# Patient Record
Sex: Male | Born: 1955
Health system: Southern US, Community
[De-identification: ages and names within clinical notes are randomized; demographics above are authoritative.]

## PROBLEM LIST (undated history)

## (undated) DIAGNOSIS — M545 Low back pain, unspecified: Secondary | ICD-10-CM

## (undated) DIAGNOSIS — E785 Hyperlipidemia, unspecified: Secondary | ICD-10-CM

## (undated) DIAGNOSIS — IMO0002 Reserved for concepts with insufficient information to code with codable children: Secondary | ICD-10-CM

## (undated) DIAGNOSIS — I2510 Atherosclerotic heart disease of native coronary artery without angina pectoris: Secondary | ICD-10-CM

## (undated) DIAGNOSIS — I209 Angina pectoris, unspecified: Secondary | ICD-10-CM

## (undated) DIAGNOSIS — Z9289 Personal history of other medical treatment: Secondary | ICD-10-CM

## (undated) DIAGNOSIS — F329 Major depressive disorder, single episode, unspecified: Secondary | ICD-10-CM

## (undated) DIAGNOSIS — E039 Hypothyroidism, unspecified: Secondary | ICD-10-CM

## (undated) DIAGNOSIS — N200 Calculus of kidney: Secondary | ICD-10-CM

## (undated) DIAGNOSIS — R001 Bradycardia, unspecified: Secondary | ICD-10-CM

## (undated) DIAGNOSIS — J449 Chronic obstructive pulmonary disease, unspecified: Secondary | ICD-10-CM

## (undated) DIAGNOSIS — B1920 Unspecified viral hepatitis C without hepatic coma: Secondary | ICD-10-CM

## (undated) DIAGNOSIS — K219 Gastro-esophageal reflux disease without esophagitis: Secondary | ICD-10-CM

## (undated) DIAGNOSIS — M1990 Unspecified osteoarthritis, unspecified site: Secondary | ICD-10-CM

## (undated) DIAGNOSIS — H5440 Blindness, one eye, unspecified eye: Secondary | ICD-10-CM

## (undated) DIAGNOSIS — F32A Depression, unspecified: Secondary | ICD-10-CM

## (undated) DIAGNOSIS — M419 Scoliosis, unspecified: Secondary | ICD-10-CM

## (undated) DIAGNOSIS — G8929 Other chronic pain: Secondary | ICD-10-CM

## (undated) DIAGNOSIS — I219 Acute myocardial infarction, unspecified: Secondary | ICD-10-CM

## (undated) DIAGNOSIS — H3530 Unspecified macular degeneration: Secondary | ICD-10-CM

## (undated) DIAGNOSIS — R1011 Right upper quadrant pain: Secondary | ICD-10-CM

## (undated) DIAGNOSIS — I10 Essential (primary) hypertension: Secondary | ICD-10-CM

## (undated) DIAGNOSIS — Z87442 Personal history of urinary calculi: Secondary | ICD-10-CM

## (undated) HISTORY — DX: Chronic obstructive pulmonary disease, unspecified: J44.9

## (undated) HISTORY — DX: Depression, unspecified: F32.A

## (undated) HISTORY — PX: CORONARY ANGIOPLASTY WITH STENT PLACEMENT: SHX49

## (undated) HISTORY — PX: TOOTH EXTRACTION: SUR596

## (undated) HISTORY — DX: Reserved for concepts with insufficient information to code with codable children: IMO0002

## (undated) HISTORY — DX: Right upper quadrant pain: R10.11

## (undated) HISTORY — PX: BACK SURGERY: SHX140

## (undated) HISTORY — DX: Major depressive disorder, single episode, unspecified: F32.9

## (undated) HISTORY — PX: INGUINAL HERNIA REPAIR: SUR1180

## (undated) HISTORY — DX: Gastro-esophageal reflux disease without esophagitis: K21.9

## (undated) HISTORY — PX: CARDIAC CATHETERIZATION: SHX172

## (undated) HISTORY — PX: COLONOSCOPY: SHX174

## (undated) HISTORY — DX: Hyperlipidemia, unspecified: E78.5

## (undated) HISTORY — DX: Atherosclerotic heart disease of native coronary artery without angina pectoris: I25.10

---

## 2006-03-21 ENCOUNTER — Ambulatory Visit: Payer: Self-pay | Admitting: *Deleted

## 2006-03-21 ENCOUNTER — Inpatient Hospital Stay (HOSPITAL_COMMUNITY): Admission: EM | Admit: 2006-03-21 | Discharge: 2006-03-25 | Payer: Self-pay | Admitting: Cardiology

## 2006-03-29 ENCOUNTER — Ambulatory Visit: Payer: Self-pay | Admitting: Cardiology

## 2006-04-07 ENCOUNTER — Ambulatory Visit: Payer: Self-pay | Admitting: Cardiology

## 2006-05-26 ENCOUNTER — Ambulatory Visit: Payer: Self-pay | Admitting: Internal Medicine

## 2006-05-26 ENCOUNTER — Inpatient Hospital Stay (HOSPITAL_COMMUNITY): Admission: EM | Admit: 2006-05-26 | Discharge: 2006-05-27 | Payer: Self-pay | Admitting: Emergency Medicine

## 2006-06-09 ENCOUNTER — Ambulatory Visit: Payer: Self-pay | Admitting: Cardiology

## 2006-06-09 ENCOUNTER — Observation Stay (HOSPITAL_COMMUNITY): Admission: EM | Admit: 2006-06-09 | Discharge: 2006-06-13 | Payer: Self-pay | Admitting: Cardiology

## 2006-06-23 ENCOUNTER — Ambulatory Visit: Payer: Self-pay | Admitting: Cardiology

## 2006-09-07 ENCOUNTER — Inpatient Hospital Stay (HOSPITAL_COMMUNITY): Admission: EM | Admit: 2006-09-07 | Discharge: 2006-09-09 | Payer: Self-pay | Admitting: Emergency Medicine

## 2006-09-07 ENCOUNTER — Ambulatory Visit: Payer: Self-pay | Admitting: *Deleted

## 2007-02-07 ENCOUNTER — Ambulatory Visit: Payer: Self-pay | Admitting: Cardiology

## 2007-02-09 ENCOUNTER — Encounter: Payer: Self-pay | Admitting: Cardiology

## 2007-03-07 ENCOUNTER — Ambulatory Visit: Payer: Self-pay | Admitting: Cardiology

## 2007-05-29 ENCOUNTER — Ambulatory Visit: Payer: Self-pay | Admitting: Cardiology

## 2007-07-11 ENCOUNTER — Ambulatory Visit: Payer: Self-pay | Admitting: Cardiology

## 2007-07-25 ENCOUNTER — Ambulatory Visit: Payer: Self-pay | Admitting: Cardiology

## 2007-11-07 ENCOUNTER — Ambulatory Visit: Payer: Self-pay | Admitting: Cardiology

## 2007-12-13 ENCOUNTER — Encounter: Payer: Self-pay | Admitting: Physician Assistant

## 2007-12-13 ENCOUNTER — Ambulatory Visit: Payer: Self-pay | Admitting: Cardiology

## 2007-12-14 ENCOUNTER — Encounter: Payer: Self-pay | Admitting: Cardiology

## 2008-02-15 ENCOUNTER — Ambulatory Visit: Payer: Self-pay | Admitting: Cardiology

## 2008-03-19 ENCOUNTER — Ambulatory Visit: Payer: Self-pay | Admitting: Cardiology

## 2008-03-29 ENCOUNTER — Encounter: Payer: Self-pay | Admitting: Cardiology

## 2008-04-18 ENCOUNTER — Ambulatory Visit: Payer: Self-pay | Admitting: Cardiology

## 2008-06-17 ENCOUNTER — Ambulatory Visit: Payer: Self-pay | Admitting: Cardiology

## 2008-06-24 ENCOUNTER — Encounter: Payer: Self-pay | Admitting: Physician Assistant

## 2008-06-24 ENCOUNTER — Ambulatory Visit: Payer: Self-pay | Admitting: Cardiology

## 2008-07-09 ENCOUNTER — Ambulatory Visit: Payer: Self-pay | Admitting: Cardiology

## 2008-10-14 ENCOUNTER — Encounter: Payer: Self-pay | Admitting: Cardiology

## 2008-11-13 ENCOUNTER — Ambulatory Visit: Payer: Self-pay | Admitting: Cardiology

## 2009-01-21 ENCOUNTER — Encounter: Payer: Self-pay | Admitting: Cardiology

## 2009-03-23 ENCOUNTER — Encounter: Payer: Self-pay | Admitting: Cardiology

## 2009-03-24 ENCOUNTER — Encounter: Payer: Self-pay | Admitting: Cardiology

## 2009-03-26 ENCOUNTER — Encounter: Payer: Self-pay | Admitting: Cardiology

## 2009-03-26 DIAGNOSIS — I498 Other specified cardiac arrhythmias: Secondary | ICD-10-CM | POA: Insufficient documentation

## 2009-03-26 DIAGNOSIS — E782 Mixed hyperlipidemia: Secondary | ICD-10-CM

## 2009-03-26 DIAGNOSIS — I10 Essential (primary) hypertension: Secondary | ICD-10-CM | POA: Insufficient documentation

## 2009-03-26 DIAGNOSIS — I2510 Atherosclerotic heart disease of native coronary artery without angina pectoris: Secondary | ICD-10-CM

## 2009-04-01 ENCOUNTER — Ambulatory Visit: Payer: Self-pay | Admitting: Cardiology

## 2009-04-09 ENCOUNTER — Encounter: Payer: Self-pay | Admitting: Cardiology

## 2009-04-20 ENCOUNTER — Encounter: Payer: Self-pay | Admitting: Cardiology

## 2009-05-05 ENCOUNTER — Ambulatory Visit: Payer: Self-pay | Admitting: Cardiovascular Disease

## 2009-05-05 ENCOUNTER — Encounter: Payer: Self-pay | Admitting: Cardiovascular Disease

## 2009-05-06 ENCOUNTER — Ambulatory Visit: Payer: Self-pay | Admitting: Cardiovascular Disease

## 2009-05-06 ENCOUNTER — Inpatient Hospital Stay (HOSPITAL_BASED_OUTPATIENT_CLINIC_OR_DEPARTMENT_OTHER): Admission: RE | Admit: 2009-05-06 | Discharge: 2009-05-06 | Payer: Self-pay | Admitting: Cardiovascular Disease

## 2009-05-21 ENCOUNTER — Ambulatory Visit: Payer: Self-pay | Admitting: Cardiology

## 2009-05-21 DIAGNOSIS — B182 Chronic viral hepatitis C: Secondary | ICD-10-CM | POA: Insufficient documentation

## 2009-06-02 ENCOUNTER — Telehealth (INDEPENDENT_AMBULATORY_CARE_PROVIDER_SITE_OTHER): Payer: Self-pay | Admitting: *Deleted

## 2009-06-05 ENCOUNTER — Encounter (INDEPENDENT_AMBULATORY_CARE_PROVIDER_SITE_OTHER): Payer: Self-pay | Admitting: *Deleted

## 2009-06-08 ENCOUNTER — Encounter: Payer: Self-pay | Admitting: Cardiovascular Disease

## 2009-06-16 ENCOUNTER — Encounter: Payer: Self-pay | Admitting: Cardiovascular Disease

## 2009-12-07 ENCOUNTER — Encounter: Payer: Self-pay | Admitting: Cardiology

## 2009-12-10 ENCOUNTER — Ambulatory Visit: Payer: Self-pay | Admitting: Cardiology

## 2009-12-11 ENCOUNTER — Encounter: Payer: Self-pay | Admitting: Cardiology

## 2009-12-29 ENCOUNTER — Ambulatory Visit: Payer: Self-pay | Admitting: Cardiology

## 2009-12-29 DIAGNOSIS — F341 Dysthymic disorder: Secondary | ICD-10-CM | POA: Insufficient documentation

## 2010-01-29 ENCOUNTER — Encounter: Payer: Self-pay | Admitting: Cardiology

## 2010-02-01 ENCOUNTER — Encounter: Payer: Self-pay | Admitting: Cardiology

## 2010-02-05 ENCOUNTER — Ambulatory Visit: Payer: Self-pay | Admitting: Cardiology

## 2010-02-23 ENCOUNTER — Telehealth (INDEPENDENT_AMBULATORY_CARE_PROVIDER_SITE_OTHER): Payer: Self-pay | Admitting: *Deleted

## 2010-03-24 ENCOUNTER — Encounter: Payer: Self-pay | Admitting: Cardiology

## 2010-03-24 ENCOUNTER — Ambulatory Visit (HOSPITAL_COMMUNITY): Admission: RE | Admit: 2010-03-24 | Discharge: 2010-03-24 | Payer: Self-pay | Admitting: Internal Medicine

## 2010-03-24 HISTORY — PX: LIVER BIOPSY: SHX301

## 2010-05-11 ENCOUNTER — Ambulatory Visit: Payer: Self-pay | Admitting: Internal Medicine

## 2010-05-11 ENCOUNTER — Encounter: Payer: Self-pay | Admitting: Cardiology

## 2010-05-14 ENCOUNTER — Ambulatory Visit: Payer: Self-pay | Admitting: Cardiology

## 2010-07-16 ENCOUNTER — Telehealth (INDEPENDENT_AMBULATORY_CARE_PROVIDER_SITE_OTHER): Payer: Self-pay | Admitting: *Deleted

## 2010-08-01 LAB — CONVERTED CEMR LAB
Eosinophils Absolute: 0.2 10*3/uL (ref 0.0–0.7)
INR: 0.9 (ref 0.8–1.0)
MCHC: 35.4 g/dL (ref 30.0–36.0)
MCV: 99.2 fL (ref 78.0–100.0)
Monocytes Absolute: 1 10*3/uL (ref 0.1–1.0)
Neutrophils Relative %: 45.4 % (ref 43.0–77.0)
Platelets: 201 10*3/uL (ref 150.0–400.0)
Prothrombin Time: 9.8 s (ref 9.1–11.7)
RDW: 12.1 % (ref 11.5–14.6)

## 2010-08-03 NOTE — Assessment & Plan Note (Signed)
Summary: 6 MO FU PER MAY REMINDER-SRS   Visit Type:  Follow-up Primary Provider:  Sherryll Burger  CC:  recent hopitalized for chest pain.  History of Present Illness: the patient is a 55 year old male with history of coronary disease, status post multiple prior percutaneous coronary interventions. His last catheterization was in November 2010, yielding moderate diffuse CAD with patent stents in the LAD, RCA and obtuse marginal branch arteries. Left ventricular function was normal.  The patient over the last several years has required multiple admissions for substernal chest pain. He was also recently admitted weeks ago with atypical chest pain and ruled out for myocardial infarction. The patient has significant anxiety and depression and has a very difficult social situation. He had foster child that got murdered. His stepdaughter who is also expecting a baby has been in jail. The patient is unable to sleep and typically sleeps 3-4 hours a night. In the last 3 months or so he has developed panic attacks and episodes of palpitations and inability to read. Patient has ruminating thoughts. He works on Engineer, manufacturing systems and trucks and reports checking behavior: He often goes back and checks if bolds are secured. He has been started by his primary care physician on citalopram 4 weeks ago and told alprazolam in the p.m.  He still reports atypical chest pain but no symptoms consistent with angina. He has no orthopnea PND or palpitations.   Preventive Screening-Counseling & Management  Alcohol-Tobacco     Smoking Status: quit     Year Quit: 2007  Current Medications (verified): 1)  Amlodipine Besylate 5 Mg Tabs (Amlodipine Besylate) .... Take 1 Tablet By Mouth Once A Day 2)  Carvedilol 6.25 Mg Tabs (Carvedilol) .... Take 1 Tablet By Mouth Two Times A Day 3)  Lisinopril 10 Mg Tabs (Lisinopril) .... Take 1 Tablet By Mouth Once A Day 4)  Plavix 75 Mg Tabs (Clopidogrel Bisulfate) .... Take 1 Tablet By Mouth Once A  Day 5)  Aspirin 81 Mg Tbec (Aspirin) .... Take One Tablet By Mouth Daily 6)  Nitrostat 0.4 Mg Subl (Nitroglycerin) .[name].. 1 Tablet Under Tongue At Onset of Chest Pain; You May Repeat Every 5 Minutes For Up To 3 Doses. 7)  Clonazepam 0.5 Mg Tbdp (Clonazepam) .... Take 0.25mg  (1/2 Tab) Two Times A Day 8)  Citalopram Hydrobromide 20 Mg Tabs (Citalopram Hydrobromide) .... Take 1 Tablet By Mouth Once A Day 9)  Zegerid 40-1100 Mg Caps (Omeprazole-Sodium Bicarbonate) .... Take 1 Tablet By Mouth Once A Day 10)  Trazodone Hcl 50 Mg Tabs (Trazodone Hcl) .... Take 1 Tab By Mouth At Bedtime  Allergies (verified): 1)  ! Codeine 2)  ! Penicillin 3)  ! * Peaches  Comments:  Nurse/Medical Assistant: The patient's medication bottles and allergies were reviewed with the patient and were updated in the Medication and Allergy Lists.  Past History:  Past Medical History: Last updated: 05/05/2009 BRADYCARDIA (ICD-427.89) HYPERTENSION, UNSPECIFIED (ICD-401.9) HYPERLIPIDEMIA-MIXED (ICD-272.4) CAD, NATIVE VESSEL (ICD-414.01) status post percutaneous intervention 2007 and 2008, multivessel drug-eluting stents. COPD  Family History: Last updated: 03/26/2009 Family History of Cancer:  Family History of Diabetes:  Family History of Hypertension:   Social History: Last updated: 03/26/2009 Full Time Married  Tobacco Use - Former.  Alcohol Use - no Regular Exercise - no Drug Use - no  Risk Factors: Smoking Status: quit (12/29/2009)   Review of Systems       The patient complains of chest pain, shortness of breath, dizziness, depression, and anxiety.  The patient  denies fatigue, malaise, fever, weight gain/loss, vision loss, decreased hearing, hoarseness, palpitations, prolonged cough, wheezing, sleep apnea, abdominal pain, blood in stool, nausea, vomiting, diarrhea, heartburn, incontinence, blood in urine, muscle weakness, joint pain, leg swelling, rash, skin lesions, headache, fainting, enlarged  lymph nodes, easy bruising or bleeding, and environmental allergies.    Vital Signs:  Patient profile:   55 year old male Height:      69 inches Weight:      181 pounds O2 Sat:      99 % Pulse rate:   54 / minute BP sitting:   129 / 86  (left arm) Cuff size:   regular  Vitals Entered By: Carlye Grippe (December 29, 2009 9:51 AM) CC: recent hopitalized for chest pain   Physical Exam  Additional Exam:  General: Well-developed, well-nourished in no distress head: Normocephalic and atraumatic eyes PERRLA/EOMI intact, conjunctiva and lids normal nose: No deformity or lesions mouth normal dentition, normal posterior pharynx neck: Supple, no JVD.  No masses, thyromegaly or abnormal cervical nodes lungs: Normal breath sounds bilaterally without wheezing.  Normal percussion heart: regular rate and rhythm with normal S1 and S2, no S3 or S4.  PMI is normal.  No pathological murmurs abdomen: Normal bowel sounds, abdomen is soft and nontender without masses, organomegaly or hernias noted.  No hepatosplenomegaly musculoskeletal: Back normal, normal gait muscle strength and tone normal pulsus: Pulse is normal in all 4 extremities Extremities: No peripheral pitting edema neurologic: Alert and oriented x 3 skin: Intact without lesions or rashes cervical nodes: No significant adenopathy psychologic: Normal affect    EKG  Procedure date:  12/29/2009  Findings:      sinus bradycardia with sinus arrhythmia. Otherwise normal EKG  Impression & Recommendations:  Problem # 1:  CAD, NATIVE VESSEL (ICD-414.01) patient was recently admitted and ruled out for myocardial infarction. His chest pain is atypical and he does not need of further ischemia workup at the present time. His updated medication list for this problem includes:    Amlodipine Besylate 5 Mg Tabs (Amlodipine besylate) .[name]... Take 1 tablet by mouth once a day    Carvedilol 6.25 Mg Tabs (Carvedilol) .[name]... Take 1 tablet by mouth two  times a day    Lisinopril 10 Mg Tabs (Lisinopril) .[name]... Take 1 tablet by mouth once a day    Plavix 75 Mg Tabs (Clopidogrel bisulfate) .[name]... Take 1 tablet by mouth once a day    Aspirin 81 Mg Tbec (Aspirin) .[name]... Take one tablet by mouth daily    Nitrostat 0.4 Mg Subl (Nitroglycerin) .[name]... 1 tablet under tongue at onset of chest pain; you may repeat every 5 minutes for up to 3 doses.  Orders: EKG w/ Interpretation (93000)  Problem # 2:  HYPERTENSION, UNSPECIFIED (ICD-401.9) blood pressure is well controlled and I made no changes in his medical therapy. His updated medication list for this problem includes:    Amlodipine Besylate 5 Mg Tabs (Amlodipine besylate) .[name]... Take 1 tablet by mouth once a day    Carvedilol 6.25 Mg Tabs (Carvedilol) .[name]... Take 1 tablet by mouth two times a day    Lisinopril 10 Mg Tabs (Lisinopril) .[name]... Take 1 tablet by mouth once a day    Aspirin 81 Mg Tbec (Aspirin) .[name]... Take one tablet by mouth daily  Problem # 3:  ANXIETY DEPRESSION (ICD-300.4) I optimize the patient's antidepressant regimen. He also has significant anxiety and I started him on clonazepam 0.25 mg p.o. b.i.d. I also gave him specific  hypnotic antidepressant properties in the form of trazodone 50 mg p.o. q.h.s. He will need to continue citalopram as prescribed by his primary care physician. Clonazepam may need to be increased to 0.5 milligrams p.o. b.i.d.I told him that he still can take occasional Xanax and he has a panic attack. The patient may need to be considered also for behavioral therapy  Patient Instructions: 1)  Stop Xanax at night 2)  Clonazepam 0.25mg  two times a day  3)  Trazodone 50mg  at bedtime  4)  Follow up in  1 month Prescriptions: TRAZODONE HCL 50 MG TABS (TRAZODONE HCL) Take 1 tab by mouth at bedtime  #30 x 1   Entered by:   Hoover Brunette, LPN   Authorized by:   Lewayne Bunting, MD, [facility]   Signed by:   Hoover Brunette, LPN on 16/04/9603   Method used:   Electronically to        [facility]* (retail)       [address]       [place], [place]  [address]       Ph: [phone]       Fax: [phone]   RxID:   [id number]   Handout requested. CLONAZEPAM 0.5 MG TBDP (CLONAZEPAM) take 0.25mg  (1/2 tab) two times a day  #30 x 2   Entered by:   Hoover Brunette, LPN   Authorized by:   Lewayne Bunting, MD, [facility]   Signed by:   Hoover Brunette, LPN on 04/30/2535   Method used:   Print then Give to Patient   RxID:   [id number]   Handout requested.  I have personnaly reviewed all medications changes and approved new prescriptions and refills. Lewayne Bunting, MD, [facility]  December 29, 2009 11:00 AM

## 2010-08-03 NOTE — Assessment & Plan Note (Signed)
Summary: 1 MO FU   Visit Type:  Follow-up Primary Provider:  Sherryll Burger   History of Present Illness: the patient is a 55 year old male with a history of coronary artery disease, former tobacco use as well as anxiety and depression. The patient has symptoms of recurrent atypical chest pain during stressful events. The patient has been placed on antianxiety and antidepressant therapy which has caused some improvement in his symptoms. During hot weather however he gets short of breath and has occasional exertional angina. Typically resting resolves his symptoms. The patient rarely however takes nitroglycerin. Overall he has somewhat improved.  Preventive Screening-Counseling & Management  Alcohol-Tobacco     Smoking Status: quit     Year Quit: 2007  Current Medications (verified): 1)  Amlodipine Besylate 5 Mg Tabs (Amlodipine Besylate) .... Take 1 Tablet By Mouth Once A Day 2)  Carvedilol 6.25 Mg Tabs (Carvedilol) .... Take 1 Tablet By Mouth Two Times A Day 3)  Lisinopril 10 Mg Tabs (Lisinopril) .... Take 1 Tablet By Mouth Once A Day 4)  Plavix 75 Mg Tabs (Clopidogrel Bisulfate) .... Take 1 Tablet By Mouth Once A Day 5)  Aspirin 81 Mg Tbec (Aspirin) .... Take One Tablet By Mouth Daily 6)  Nitrostat 0.4 Mg Subl (Nitroglycerin) .[name].. 1 Tablet Under Tongue At Onset of Chest Pain; You May Repeat Every 5 Minutes For Up To 3 Doses. 7)  Clonazepam 0.5 Mg Tbdp (Clonazepam) .... Take 0.25mg  (1/2 Tab) Two Times A Day 8)  Citalopram Hydrobromide 20 Mg Tabs (Citalopram Hydrobromide) .... Take 1 Tablet By Mouth Once A Day 9)  Zegerid 40-1100 Mg Caps (Omeprazole-Sodium Bicarbonate) .... Take 1 Tablet By Mouth Once A Day 10)  Trazodone Hcl 50 Mg Tabs (Trazodone Hcl) .... Take 1 Tab By Mouth At Bedtime 11)  Imdur 30 Mg Xr24h-Tab (Isosorbide Mononitrate) .... Take 1 Tablet By Mouth Once A Day  Allergies: 1)  ! Codeine 2)  ! Penicillin 3)  ! * Peaches  Comments:  Nurse/Medical Assistant: The patient's  medications were reviewed with the patient and were updated in the Medication List. Pt brought medication bottles to office visit.  Cyril Loosen, RN, BSN (February 05, 2010 1:17 PM)  Past History:  Past Medical History: Last updated: 05/05/2009 BRADYCARDIA (ICD-427.89) HYPERTENSION, UNSPECIFIED (ICD-401.9) HYPERLIPIDEMIA-MIXED (ICD-272.4) CAD, NATIVE VESSEL (ICD-414.01) status post percutaneous intervention 2007 and 2008, multivessel drug-eluting stents. COPD  Family History: Last updated: 03/26/2009 Family History of Cancer:  Family History of Diabetes:  Family History of Hypertension:   Social History: Last updated: 03/26/2009 Full Time Married  Tobacco Use - Former.  Alcohol Use - no Regular Exercise - no Drug Use - no  Risk Factors: Exercise: no (03/26/2009)  Risk Factors: Smoking Status: quit (02/05/2010)  Review of Systems       The patient complains of fatigue, chest pain, shortness of breath, prolonged cough, and anxiety.  The patient denies malaise, fever, weight gain/loss, vision loss, decreased hearing, hoarseness, palpitations, wheezing, sleep apnea, coughing up blood, abdominal pain, blood in stool, nausea, vomiting, diarrhea, heartburn, incontinence, blood in urine, muscle weakness, joint pain, leg swelling, rash, skin lesions, headache, fainting, dizziness, depression, enlarged lymph nodes, easy bruising or bleeding, and environmental allergies.    Vital Signs:  Patient profile:   55 year old male Height:      69 inches Weight:      178.25 pounds BMI:     26.42 Pulse rate:   54 / minute BP sitting:   146 / 86  (  left arm) Cuff size:   regular  Vitals Entered By: Cyril Loosen, RN, BSN (February 05, 2010 1:14 PM)  Nutrition Counseling: Patient's BMI is greater than 25 and therefore counseled on weight management options. Comments Follow up office visit   Physical Exam  Additional Exam:  General: Well-developed, well-nourished in no distress head:  Normocephalic and atraumatic eyes PERRLA/EOMI intact, conjunctiva and lids normal nose: No deformity or lesions mouth normal dentition, normal posterior pharynx neck: Supple, no JVD.  No masses, thyromegaly or abnormal cervical nodes lungs: Normal breath sounds bilaterally without wheezing.  Normal percussion heart: regular rate and rhythm with normal S1 and S2, no S3 or S4.  PMI is normal.  No pathological murmurs abdomen: Normal bowel sounds, abdomen is soft and nontender without masses, organomegaly or hernias noted.  No hepatosplenomegaly musculoskeletal: Back normal, normal gait muscle strength and tone normal pulsus: Pulse is normal in all 4 extremities Extremities: No peripheral pitting edema neurologic: Alert and oriented x 3 skin: Intact without lesions or rashes cervical nodes: No significant adenopathy psychologic: Normal affect    Impression & Recommendations:  Problem # 1:  ANXIETY DEPRESSION (ICD-300.4) improvement in symptoms. Patient is continuing current therapy  Problem # 2:  CAD, NATIVE VESSEL (ICD-414.01) the patient has atypical chest pain but also symptoms of exertional angina. I've given him a prescription of isosorbide mononitrate 30 mg a day. I also asked him to use nitroglycerin as needed His updated medication list for this problem includes:    Amlodipine Besylate 5 Mg Tabs (Amlodipine besylate) .[name]... Take 1 tablet by mouth once a day    Carvedilol 6.25 Mg Tabs (Carvedilol) .[name]... Take 1 tablet by mouth two times a day    Lisinopril 10 Mg Tabs (Lisinopril) .[name]... Take 1 tablet by mouth once a day    Plavix 75 Mg Tabs (Clopidogrel bisulfate) .[name]... Take 1 tablet by mouth once a day    Aspirin 81 Mg Tbec (Aspirin) .[name]... Take one tablet by mouth daily    Nitrostat 0.4 Mg Subl (Nitroglycerin) .[name]... 1 tablet under tongue at onset of chest pain; you may repeat every 5 minutes for up to 3 doses.    Imdur 30 Mg Xr24h-tab (Isosorbide mononitrate) .[name]... Take 1 tablet by  mouth once a day  Problem # 3:  HYPERTENSION, UNSPECIFIED (ICD-401.9) blood pressures controlled. His updated medication list for this problem includes:    Amlodipine Besylate 5 Mg Tabs (Amlodipine besylate) .[name]... Take 1 tablet by mouth once a day    Carvedilol 6.25 Mg Tabs (Carvedilol) .[name]... Take 1 tablet by mouth two times a day    Lisinopril 10 Mg Tabs (Lisinopril) .[name]... Take 1 tablet by mouth once a day    Aspirin 81 Mg Tbec (Aspirin) .[name]... Take one tablet by mouth daily  Patient Instructions: 1)  Imdur 30mg  daily 2)  Follow up in  3 months  Prescriptions: PLAVIX 75 MG TABS (CLOPIDOGREL BISULFATE) Take 1 tablet by mouth once a day  #30 x 6   Entered by:   Hoover Brunette, LPN   Authorized by:   Lewayne Bunting, MD, [facility]   Signed by:   Hoover Brunette, LPN on 32/44/0102   Method used:   Electronically to        [facility]* (retail)       [address]       [place], [place]  [address]       Ph: [phone]  Fax: [phone]   RxID:   [id number] LISINOPRIL 10 MG TABS (LISINOPRIL) Take 1 tablet by mouth once a day  #30 x 6   Entered by:   Hoover Brunette, LPN   Authorized by:   Lewayne Bunting, MD, [facility]   Signed by:   Hoover Brunette, LPN on 93/23/5573   Method used:   Electronically to        [facility]* (retail)       [address]       [place], [place]  [address]       Ph: [phone]       Fax: [phone]   RxID:   [id number] IMDUR 30 MG XR24H-TAB (ISOSORBIDE MONONITRATE) Take 1 tablet by mouth once a day  #30 x 6   Entered by:   Hoover Brunette, LPN   Authorized by:   Lewayne Bunting, MD, [facility]   Signed by:   Hoover Brunette, LPN on 03/50/0938   Method used:   Electronically to        [facility]* (retail)       [address]       [place], [place]  [address]       Ph: [phone]       Fax: [phone]   RxID:   [id number]   Handout requested.

## 2010-08-03 NOTE — Assessment & Plan Note (Signed)
Summary: 44mo ful fholt   Visit Type:  Follow-up Primary Provider:  Dr. Mervyn Skeeters. Shah/Dr. Rehman   History of Present Illness: patient had a recent liver biopsy. Indication hepatitis C.the patient is scheduled to start on several retroviral therapies as well as interferon.he needs to have adjustments made in his medical regimen. He's here todaydiscuss these medication changes.  the patient is a 55 year old male with a history of coronary artery disease, former tobacco use as well as anxiety and depression. The patient has symptoms of recurrent atypical chest pain during stressful events. The patient has been placed on antianxiety and antidepressant therapy which has caused some improvement in his symptoms. During hot weather however he gets short of breath and has occasional exertional angina. Typically resting resolves his symptoms. The patient rarely however takes nitroglycerin.   the patient had a cardiac catheterization in November 2010 exactly year ago. He had moderate diffuse coronary artery disease. He has normal LV function.  Preventive Screening-Counseling & Management  Alcohol-Tobacco     Smoking Status: quit  Comments: quit smoking about 2-3 yrs ago when had MI, smoked for about 20-30 yrs.  Current Medications (verified): 1)  Amlodipine Besylate 5 Mg Tabs (Amlodipine Besylate) .... Take 1 Tablet By Mouth Once A Day 2)  Carvedilol 6.25 Mg Tabs (Carvedilol) .... Take 1 Tablet By Mouth Two Times A Day 3)  Lisinopril 10 Mg Tabs (Lisinopril) .... Take 1 Tablet By Mouth Once A Day 4)  Plavix 75 Mg Tabs (Clopidogrel Bisulfate) .... Take 1 Tablet By Mouth Once A Day 5)  Aspirin 81 Mg Tbec (Aspirin) .... Take One Tablet By Mouth Daily 6)  Nitrostat 0.4 Mg Subl (Nitroglycerin) .[name].. 1 Tablet Under Tongue At Onset of Chest Pain; You May Repeat Every 5 Minutes For Up To 3 Doses. 7)  Clonazepam 0.5 Mg Tbdp (Clonazepam) .... Take 0.25mg  (1/2 Tab) Two Times A Day 8)  Citalopram Hydrobromide 20 Mg  Tabs (Citalopram Hydrobromide) .... Take 1 Tablet By Mouth Once A Day 9)  Zegerid 40-1100 Mg Caps (Omeprazole-Sodium Bicarbonate) .... Take 1 Tablet By Mouth Once A Day 10)  Trazodone Hcl 50 Mg Tabs (Trazodone Hcl) .... Take 1 Tab By Mouth At Bedtime 11)  Hydrocodone-Acetaminophen 5-500 Mg Tabs (Hydrocodone-Acetaminophen) .... Take 1 Tablet By Mouth Two Times A Day As Needed Pain  Allergies: 1)  ! Codeine 2)  ! Penicillin 3)  ! * Peaches  Comments:  Nurse/Medical Assistant: The patient's medications were reviewed with the patient and were updated in the Medication List. Pt brought medication bottles to office visit.  Cyril Loosen, RN, BSN (May 14, 2010 2:49 PM)  Past History:  Past Medical History: Last updated: 05/05/2009 BRADYCARDIA (ICD-427.89) HYPERTENSION, UNSPECIFIED (ICD-401.9) HYPERLIPIDEMIA-MIXED (ICD-272.4) CAD, NATIVE VESSEL (ICD-414.01) status post percutaneous intervention 2007 and 2008, multivessel drug-eluting stents. COPD  Family History: Last updated: 03/26/2009 Family History of Cancer:  Family History of Diabetes:  Family History of Hypertension:   Social History: Last updated: 03/26/2009 Full Time Married  Tobacco Use - Former.  Alcohol Use - no Regular Exercise - no Drug Use - no  Risk Factors: Smoking Status: quit (05/14/2010)  Review of Systems       The patient complains of fatigue, prolonged cough, and anxiety.  The patient denies malaise, fever, weight gain/loss, vision loss, decreased hearing, hoarseness, chest pain, palpitations, shortness of breath, wheezing, sleep apnea, coughing up blood, abdominal pain, blood in stool, nausea, vomiting, diarrhea, heartburn, incontinence, blood in urine, muscle weakness, joint pain, leg swelling, rash,  skin lesions, headache, fainting, dizziness, depression, enlarged lymph nodes, easy bruising or bleeding, and environmental allergies.    Vital Signs:  Patient profile:   55 year old male Height:       69 inches Weight:      170.50 pounds BMI:     25.27 Pulse rate:   51 / minute BP sitting:   147 / 87  (left arm) Cuff size:   regular  Vitals Entered By: Cyril Loosen, RN, BSN (May 14, 2010 2:42 PM)  Nutrition Counseling: Patient's BMI is greater than 25 and therefore counseled on weight management options. Comments Follow up office visit. Pt is gettin ready to start treatments for Hepatitis with Dr. Karilyn Cota. Per pt, Dr. Karilyn Cota told pt be on 1/2 his current dosage of Amlodipine, Trazadone, Xanax, and Clonazepam b/c per the pt it will be like being on the full dose. Pt will be taking Incivek 375mg , Riavirin 200mg , Pegasys kit.   Physical Exam  Additional Exam:  General: Well-developed, well-nourished in no distress head: Normocephalic and atraumatic eyes PERRLA/EOMI intact, conjunctiva and lids normal nose: No deformity or lesions mouthpoor dentition, normal posterior pharynx neck: Supple, no JVD.  No masses, thyromegaly or abnormal cervical nodes lungs: Normal breath sounds bilaterally without wheezing.  Normal percussion heart: regular rate and rhythm with normal S1 and S2, no S3 or S4.  PMI is normal.  No pathological murmurs abdomen: Normal bowel sounds, abdomen is soft and nontender without masses, organomegaly or hernias noted.  No hepatosplenomegaly musculoskeletal: Back normal, normal gait muscle strength and tone normal pulsus: Pulse is normal in all 4 extremities Extremities: No peripheral pitting edema neurologic: Alert and oriented x 3 skin: Intact without lesions or rashes cervical nodes: No significant adenopathy psychologic: Normal affect    Impression & Recommendations:  Problem # 1:  HEPATITIS C CARRIER (ICD-V02.62) the patient will restart on several retroviral medications as well as interferon. When he starts his therapy he will have to discontinue trazodone as well as amlodipine. Clonazepam can be continued on a lower dose. I will prescribe Ambien 10  mg p.o. q.h.s. for sleep at that time. In the interim he can continue his medications until he is ready to start his retroviral and interferon therapy regimen  Problem # 2:  CAD, NATIVE VESSEL (ICD-414.01) no recurrent chest pain. Hemodynamically stable. Continue current medical therapy. The following medications were removed from the medication list:    Imdur 30 Mg Xr24h-tab (Isosorbide mononitrate) .[name]... Take 1 tablet by mouth once a day His updated medication list for this problem includes:    Amlodipine Besylate 5 Mg Tabs (Amlodipine besylate) .[name]... Take 1 tablet by mouth once a day    Carvedilol 6.25 Mg Tabs (Carvedilol) .[name]... Take 1 tablet by mouth two times a day    Lisinopril 10 Mg Tabs (Lisinopril) .[name]... Take 1 tablet by mouth once a day    Plavix 75 Mg Tabs (Clopidogrel bisulfate) .[name]... Take 1 tablet by mouth once a day    Aspirin 81 Mg Tbec (Aspirin) .[name]... Take one tablet by mouth daily    Nitrostat 0.4 Mg Subl (Nitroglycerin) .[name]... 1 tablet under tongue at onset of chest pain; you may repeat every 5 minutes for up to 3 doses.  Problem # 3:  HYPERTENSION, UNSPECIFIED (ICD-401.9) blood pressure is stable. I do think safely to stop amlodipine when he starts with his hepatitis C treatment. We will just have to follow his blood pressure. His updated medication list for this problem includes:  Amlodipine Besylate 5 Mg Tabs (Amlodipine besylate) .[name]... Take 1 tablet by mouth once a day    Carvedilol 6.25 Mg Tabs (Carvedilol) .[name]... Take 1 tablet by mouth two times a day    Lisinopril 10 Mg Tabs (Lisinopril) .[name]... Take 1 tablet by mouth once a day    Aspirin 81 Mg Tbec (Aspirin) .[name]... Take one tablet by mouth daily  Patient Instructions: 1)  Your physician recommends that you continue on your current medications as directed. Please refer to the Current Medication list given to you today. 2)  Follow up in  6 months 3)  ================================================= 4)   ================================================= 5)  Stop Trazodone 6)  Ambien 10mg  at bedtime 7)  Clonazepam 0.25mg  two times a day  8)  Stop Amlodipine 9)  All changes above will begine after starting treatment.

## 2010-08-03 NOTE — Letter (Signed)
Summary: [facility] - Operative Report  [facility] - Operative Report   Imported By: Marylou Mccoy 07/22/2009 17:52:12  _____________________________________________________________________  External Attachment:    Type:   Image     Comment:   External Document

## 2010-08-03 NOTE — Consult Note (Signed)
Summary: Consultation Report  Consultation Report   Imported By: Dorise Hiss 05/14/2010 10:41:40  _____________________________________________________________________  External Attachment:    Type:   Image     Comment:   External Document

## 2010-08-03 NOTE — Letter (Signed)
Summary: Discharge Summary/ REPORT  Discharge Summary/ REPORT   Imported By: Dorise Hiss 01/05/2010 12:18:05  _____________________________________________________________________  External Attachment:    Type:   Image     Comment:   External Document

## 2010-08-03 NOTE — Progress Notes (Signed)
Summary: colonoscopy  Phone Note Other Incoming Call back at ext.  [phone]   Caller: Ann with Dr. Karilyn Cota  Summary of Call: Having colonoscopy (lg polyp) on 9/7.  Needs to know if okay to hold Plavix 5-7 days prior to this  [facility], LPN  February 23, 2010 2:35 PM   Follow-up for Phone Call        Continue ASA if possible. Increased risk with stopping Plavix but if colonoscopy is essential, then OK to stop Plavix 5 days before and resume ASAP.  Follow-up by: Lewayne Bunting, MD, [facility],  February 25, 2010 12:55 PM  Additional Follow-up for Phone Call Additional follow up Details #1::        Ann with Dr. Karilyn Cota notified.  Will fax note.    Additional Follow-up by: Hoover Brunette, LPN,  February 26, 2010 4:14 PM

## 2010-08-05 NOTE — Progress Notes (Signed)
Summary: med refill   Phone Note Other Incoming   Summary of Call: [facility] sent refill request on Clonazepam 0.5mg  - 1/2 tab two times a day & Trazodone 50mg  at bedtime.  Per dictation in November, patient was to call us when he went on new meds (antiviral) for Hepatitis C.  Above meds were to be adjusted prior to that.  Called patient - has not been able to start these meds due to insurance approval.  Following up with Dr. Karilyn Cota on this.  Please advise on refills above.   Also, now has new problem with eyes.  Seeing Dr. Lucie Leather at [facility]. Had some type of dye study on eyes.  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  Going to [place] next week to have MRI, concerned about blockage. Initial call taken by: Hoover Brunette, LPN,  July 16, 2010 4:12 PM  Follow-up for Phone Call        okay to refill clonazepam and trazodone.30 days each with one refill Follow-up by: Lewayne Bunting, MD, [facility],  July 16, 2010 4:25 PM  Additional Follow-up for Phone Call Additional follow up Details #1::        Patient notified.    Hoover Brunette, LPN  July 19, 2010 10:15 AM

## 2010-09-16 LAB — CBC
HCT: 40.1 % (ref 39.0–52.0)
Hemoglobin: 14.2 g/dL (ref 13.0–17.0)
MCH: 33.3 pg (ref 26.0–34.0)
MCHC: 35.3 g/dL (ref 30.0–36.0)
MCV: 94.1 fL (ref 78.0–100.0)
Platelets: 147 10*3/uL — ABNORMAL LOW (ref 150–400)
RBC: 4.26 MIL/uL (ref 4.22–5.81)
RDW: 12.6 % (ref 11.5–15.5)
WBC: 5.7 10*3/uL (ref 4.0–10.5)

## 2010-09-16 LAB — PROTIME-INR
INR: 0.95 (ref 0.00–1.49)
Prothrombin Time: 12.9 s (ref 11.6–15.2)

## 2010-09-23 ENCOUNTER — Other Ambulatory Visit: Payer: Self-pay | Admitting: *Deleted

## 2010-09-23 ENCOUNTER — Encounter: Payer: Self-pay | Admitting: *Deleted

## 2010-09-23 MED ORDER — CLONAZEPAM 0.5 MG PO TABS: 0.2500 mg | ORAL_TABLET | Freq: Two times a day (BID) | ORAL | Status: DC | PRN

## 2010-09-23 NOTE — Telephone Encounter (Signed)
Patient request refill for clonazepam/rx approved and faxed to pharmacy

## 2010-11-15 ENCOUNTER — Other Ambulatory Visit: Payer: Self-pay | Admitting: *Deleted

## 2010-11-15 MED ORDER — LISINOPRIL 10 MG PO TABS: 10.0000 mg | ORAL_TABLET | Freq: Every day | ORAL | Status: DC

## 2010-11-15 MED ORDER — CLOPIDOGREL BISULFATE 75 MG PO TABS: 75.0000 mg | ORAL_TABLET | Freq: Every day | ORAL | Status: DC

## 2010-11-16 NOTE — Assessment & Plan Note (Signed)
[facility]                          [facility] OFFICE NOTE   Roy [name], Roy [name]                      MRN:          [id number]  DATE:06/17/2008                            DOB:          Nov 16, 1955    REFERRING PHYSICIAN:  Dr. Sherryll Burger   REFERRING PHYSICIAN:  Dr. Sherryll Burger   HISTORY OF PRESENT ILLNESS:  The patient is a 55 year old male with a  history of coronary artery disease with the last catheterization in  2008.  He previously underwent Cypher stenting of the LAD for in-stent  restenosis in December 2007 and also had cutting balloon PTCA of the RCA  for high-grade in-stent restenosis.  He also is status post a bare-metal  stenting of the high-grade mid LAD and OM1 stenosis.  His last stress  test was in 2008 and was low risk.   The patient was recently seen as part of the IMPROVE-IT trial.  When he  presented to the office, he was complaining of severe substernal chest  pain which he quoted 10/10.  He had to take four nitroglycerins.  His  EKG, however, showed no acute ischemic changes.  The patient has had  prior hospitalizations here at the [facility] for substernal chest pain  and has ruled out on multiple occasions.  However, this pain was  different.  It was substernal heavy in nature with radiation to the jaw  and neck area similar to his prior presentations at the time of his  stenting.  He also has exertional dyspnea and exertional chest pain.  The last 3 days, he also developed some low-grade fevers, has a  productive cough of yellow sputum, and has nasal congestion.  Although  the patient stopped smoking several years ago, he is constantly around  secondhand smoke.  His wife also recently reported an episode of  bronchitis.  The patient denies, however, any significant myalgias.   MEDICATIONS:  1. Plavix 75 mg p.o. daily.  2. IMPROVE-IT study drug.  3. Enteric-coated aspirin 81 mg a day.  4. Carvedilol 6.25 half a tablet p.o. b.i.d.  5. Lisinopril 10 mg half a tablet p.o. b.i.d.   PHYSICAL EXAMINATION:  VITAL SIGNS:  Blood pressure is 150/92, heart  rate 73, weight is 183 pounds, oxygen saturation 96%.  GENERAL:  Well-nourished white male in no apparent distress.  HEENT:  Poor dentition.  NECK:  Normal carotid stroke.  No carotid bruits.  LUNGS:  Scattered rhonchi bilaterally with expiratory wheezes.  HEART:  Regular rate and rhythm with normal S1 and S2, and no  pathological murmurs.  ABDOMEN:  Soft, nontender.  No rebound or guarding and good bowel  sounds.  EXTREMITIES:  No cyanosis, clubbing, or edema.  NEUROLOGIC:  The patient is alert, oriented, and grossly nonfocal.   PROBLEM LIST:  1. Acute bronchitis.  2. Multivessel coronary artery disease with recurrent angina.  3. Dyslipidemia, IMPROVE-IT study.  4. Hypertension, poorly controlled.  5. Chronic obstructive pulmonary disease with history of tobacco and      secondhand smoke.  6. Asymptomatic sinus bradycardia, intolerant to increased dose of  Coreg.   PLAN:  1. I do not think that the patient has a flu-like syndrome.  I rather      think he has acute bacterial bronchitis.  He has yellow phlegm and      is coughing quite a bit and has scattered rhonchi on his chest      exam.  Given a prescription for Z-PAK as well as Tussionex for      cough.  2. The patient's symptoms of chest pain are very concerning for      angina.  However, since his evaluation several weeks ago, he has      had no recurrent chest pain or required any new nitroglycerins.  I      do think his symptomatology is stable, but he does need a cardiac      catheterization.  We will treat his bronchitis first.  I have also      placed him on amlodipine for better blood pressure control also as      an antianginal drug.   The patient will follow up in 1 week.  The patient has also been  notified that he has recurrent substernal chest pain in the next couple  of days, he will need  to present to the emergency room.     Learta Codding, MD,FACC  Electronically Signed    GED/MedQ  DD: 06/17/2008  DT: 06/18/2008  Job #: [id number]

## 2010-11-16 NOTE — Assessment & Plan Note (Signed)
[facility]                          [facility]ICE NOTE   RYOSUKE, ERICKSEN                      MRN:          [id number]  DATE:06/17/2008                            DOB:          [birth year]    HISTORY OF PRESENT ILLNESS:  The patient is a 55 year old male with a  history of known coronary artery disease.  Last catheterization was in  March 2008.  He previously underwent Cypher stenting to the LAD for in-  stent restenosis in 2007 as well as cutting balloon PTCA of the RCA  secondary to high-grade in-stent restenosis November 2007.  The patient  was seen by [facility] people when he reported 10/10 chest  pain.  He had to take 4 nitroglycerins.    INCOMPLETE DICTATION.     Learta Codding, MD,FACC  Electronically Signed    GED/MedQ  DD: 06/17/2008  DT: 06/18/2008  Job #: [id number]

## 2010-11-16 NOTE — Assessment & Plan Note (Signed)
[facility]                          [facility] OFFICE NOTE   MCCARTNEY, CHUBA                      MRN:          [id number]  DATE:02/15/2008                            DOB:          07-Jun-1956    PRIMARY CARDIOLOGIST:  Learta Codding, MD, [facility].   REASON FOR VISIT:  Post-hospital followup.   Mr. Worth returns to our clinic following a recent brief stay here at  [facility] in mid June.  He presented with chest pain, which we felt was  quite atypical, and serial cardiac markers were all within normal  limits.  We did not recommend any medication adjustments, nor ischemic  workup, but early post-hospital clinic followup.   Mr. Bose has not had any recurrent chest pain.  He continues to  maintain that the symptoms were all related to his recent left inguinal  hernia repair, with the discomfort originating in the left groin.  He  did subsequently follow up with Dr. Erskine Speed and reportedly  everything has healed quite well.   Mr. Felch has not smoked tobacco since his MI in 2007.  He reports  compliance with his medications.  He continues to work as a Curator and  denies any exertional chest discomfort.   Unfortunately, Mr. Smola does remain exposed to considerable secondhand  smoke, including in his own home.  His wife still smokes quite heavily.   CURRENT MEDICATIONS:  1. Carvedilol 3.125 b.i.d.  2. Aspirin 81 daily.  3. Plavix.  4. Lisinopril 5 daily.  5. IMPROVE-IT study drug.   PHYSICAL EXAMINATION:  VITAL SIGNS:  Blood pressure 146/82, pulse 61 and  regular, weight 188.  GENERAL:  A 55 year old male sitting upright in no distress.  HEENT:  Normocephalic, atraumatic.  NECK:  Palpable carotid pulses without bruits; no JVD.  LUNGS:  Diminished breath sounds with mild rhonchi; no crackles or  wheezes.  HEART:  RRR (S1, S2).  No significant murmurs.  ABDOMEN:  Soft, nontender.  EXTREMITIES:  Palpable bilateral pulses without  edema.  NEURO:  No focal deficit.   IMPRESSION:  1. Multivessel coronary artery disease.      a.     Nonobstructive CAD by last catheterization, March 2008.      b.     Status post Cypher stenting of LAD secondary to in-stent       restenosis, December 2007.      c.     Status post cutting balloon PTCA of RCA, secondary to high-       grade in-stent restenosis, November 2007.      d.     Status post prior bare-metal stenting of high-grade mid LAD       and OM1 stenosis.      e.     Low risk exercise stress Cardiolite; EF 60%, August 2008.  2. Dyslipidemia.      a.     Enrolled in IMPROVE-IT study.  3. Hypertension.  4. COPD/history of tobacco.  5. Chronic, asymptomatic sinus bradycardia.      a.     Intolerant to increased Coreg dose.  PLAN:  1. Up-titrate lisinopril to 10 mg daily, for more aggressive blood      pressure control.  2. Renew Plavix.  The patient has been previously advised to remain on      this indefinitely, in conjunction with low-dose aspirin.  3. Schedule return clinic followup with myself and Dr. Andee Lineman in 6      months.      Rozell Searing, PA-C  Electronically Signed      Jonelle Sidle, MD  Electronically Signed   GS/MedQ  DD: 02/15/2008  DT: 02/16/2008  Job #: [id number]   cc:   Kirstie Peri, MD

## 2010-11-19 NOTE — Discharge Summary (Signed)
NAMEMARCAS, Roy [name]               ACCOUNT NO.:  [id number]   MEDICAL RECORD NO.:  [id number]          PATIENT TYPE:  INP   LOCATION:  6529                         FACILITY:  [facility]   PHYSICIAN:  [name], PA-C       DATE OF BIRTH:  [birth year]   DATE OF ADMISSION:  05/26/2006  DATE OF DISCHARGE:  05/27/2006                                 DISCHARGE SUMMARY   ADDENDUM TO DISCHARGE SUMMARY:  Prior to discharge, the patient did develop  some mild oozing of the right groin incision site.  However, this was  controlled with initial manual compression followed by epinephrine  injection, resulting in resolution of the bleeding.  The patient was  monitored for approximately 1 hour afterwards, and cleared for discharge in  stable condition.      [name], PA-C     GS/MEDQ  D:  05/27/2006  T:  05/27/2006  Job:  [id number]

## 2010-11-19 NOTE — Assessment & Plan Note (Signed)
[facility]                          [facility]ICE NOTE   FINLAY, GODBEE                      MRN:          [id number]  DATE:05/29/2007                            DOB:          August 17, 1955    HISTORY OF PRESENT ILLNESS:  Patient is a white male with a history of  severe coronary artery disease.  Patient is status post recurrent  coronary intervention.  He has known multivessel coronary artery  disease.  Patient presents for a followup.  He had a recent Cardiolite  study done in August.  This was indicative of lower risk features with  no significant ischemia burden.  The ejection fraction was 50%.   The patient states that he has not used nitroglycerin in several weeks.  He also does not smoke anymore.  I advised him of the danger of using  Viagra with long-acting nitrates.   He has had no orthopnea or PND.  He presents now for a routine follow-up  visit.   His EKG in the office today was essentially within normal limits.   MEDICATIONS:  1. Plavix 75 mg p.o. daily.  2. Lisinopril 5 mg p.o. daily.  3. Study drug.  4. Carvedilol 3.125 p.o. b.i.d.  5. External carotid artery  81 mg p.o. daily.   PHYSICAL EXAMINATION:  VITAL SIGNS:  Blood pressure is 120/74.  Heart  rate is 80 beats per minute.  Weight is 189 pounds.  NECK:  Normal carotid upstrokes.  No carotid bruits.  LUNGS:  Clear breath sounds bilaterally.  HEART:  Regular rate and rhythm.  Normal S1 and S2.  ABDOMEN:  Soft, nontender.  EXTREMITIES:  No clubbing, cyanosis or edema.  NEUROLOGIC:  Patient is alert and oriented.  Grossly nonfocal.   PROBLEM LIST:  1. Multivessel coronary artery disease.      a.     Status post drug-eluting stent to the mid left anterior       descending artery.      b.     Status post cutting balloon to high grade end-stage stent       restenosis to the right coronary artery on May 26, 2006.      c.     Mild left ventricular dysfunction,  now with an ejection       fraction of 50%.      d.     Lower risk Cardiolite study on February 09, 2007.  2. Asymptomatic sinus bradycardia.  3. Dyslipidemia, improved with study drug.  4. Tobacco use is continued.  5. Hypertension, stable.   PLAN:  1. Patient is doing quite well.  He has had no unstable angina      symptoms.  Will continue his medical regimen.  2. Patient has been advised about interactions of Viagra and long-      acting nitrates.  3. We reviewed in the office today his Cardiolite study.  4. Patient can follow up for a routine visit in 16 months.     Learta Codding, MD,FACC  Electronically Signed    GED/MedQ  DD: 06/11/2007  DT:  06/11/2007  Job #: [id number]   cc:   Kirstie Peri, MD

## 2010-11-19 NOTE — Discharge Summary (Signed)
Roy [name], Roy [name]               ACCOUNT NO.:  [id number]   MEDICAL RECORD NO.:  [id number]          PATIENT TYPE:  INP   LOCATION:  6532                         FACILITY:  [facility]   PHYSICIAN:  Veverly Fells. Excell Seltzer, MD  DATE OF BIRTH:  [birth year]   DATE OF ADMISSION:  06/09/2006  DATE OF DISCHARGE:  06/13/2006                               DISCHARGE SUMMARY   PRIMARY CARDIOLOGIST:  Dr. Andee Lineman.   PRIMARY CARE PHYSICIAN:  Dr. Kirstie Peri, MD in [place].   PRINCIPAL DIAGNOSIS:  Unstable angina/coronary artery disease.   SECONDARY DIAGNOSES:  1. Mild LV dysfunction with previously documented EF of 45%, March 10, 2006.  2. Hypertension.  3. Hyperlipidemia.  4. History of tobacco abuse, status post inguinal hernia repair.   ALLERGIES:  PENICILLIN AND CODEINE.   PROCEDURES:  Left heart cardiac catheterization with successful PCI and  drug-eluting stent placement to the proximal LAD.   HISTORY OF PRESENT ILLNESS:  A 55 year old male with prior history of  severe multi-vessel coronary artery disease, status post non-ST-  elevation MI September 2007, treated with emergent bare metal stenting  of the right coronary artery and subsequent staged bare metal stenting  of both the LAD and first obtuse marginal.  Two weeks followup following  that admission, he returned to [facility], subsequently [facility], for repeat catheterization in the setting of unstable angina  revealing high grade in-stent restenosis in the RCA, which was treated  with cutting balloon angioplasty.  On March 10, 2006, he reported to  [facility] again with complaints chest pain, which was  constant in nature over a period of about 1 week.  His cardiac enzymes  were negative, and his ECG did not reveal any evidence of ischemia.  However, given his prior history of restenosis, a decision was made to  transfer him to [facility] for further evaluation.   HOSPITAL COURSE:  The patient  denied having any chest discomfort over  the weekend, and given the somewhat atypical slash constant nature of  his discomfort, underwent CT of the chest and abdomen, with the CT of  the chest being negative for PE, and the abdominal CT revealing celiac  artery stenosis.  He underwent left heart cardiac catheterization on  December 10th, revealing moderate restenosis of 75% within the  proximally placed LAD stent.  This was confirmed with intravascular  ultrasound.  Otherwise, the RCA stent was widely patent, and the obtuse  marginal stent would have only mild restenosis.  EF was 65% by left  ventriculography.  He then underwent successful PCI and stenting of the  lesion within the previously placed stent in the LAD, with placement of  a 3.0 x 8mm Cypher drug-eluting stent.  He tolerated this procedure  well, and post-procedure cardiac markers have remained negative.  He has  ambulating without difficulty, and he will be discharged home today in  satisfactory condition.   He should remain on aspirin and Plavix lifelong.   DISCHARGE LABS:  Hemoglobin 11.8, hematocrit 33.7, WBC 8.9, platelet  205, MCV 91.4, sodium 141,  potassium 4.2, chloride 107, CO2 29, BUN 13,  creatinine 0.7, glucose 95, PTT 30, total bilirubin 0.7, alkaline  phosphatase 48, AST 19, ALT 28, albumin 3.3, CK of 47, MB 1.5, troponin-  I 0.02, calcium 8.6, free T4 0.95, ESR 10.   DISPOSITION:  The patient is being discharged home today in good  condition.   FOLLOW-UP APPOINTMENTS:  He has appointment scheduled with Dr. Lewayne Bunting in [place], on December 21 at 2:20 p.m..  He is asked to follow up  with Dr. Sherryll Burger as scheduled.   DISCHARGE MEDICATIONS:  1. Aspirin 325 mg daily.  2. Plavix 75 mA daily.  3. Coreg 3.35 mg b.i.d.  4. Lisinopril 5 mg daily.  5. Previous study drug q.d.  6. Nitroglycerin 0.4 mg sublingual p.r.n. chest pain.   __________ studies none.  Duration of discharge encounter 4 minutes  including  physician time.      Nicolasa Ducking, ANP      Veverly Fells. Excell Seltzer, MD  Electronically Signed    CB/MEDQ  D:  06/13/2006  T:  06/13/2006  Job:  [id number]   cc:   Kirstie Peri, MD

## 2010-11-19 NOTE — Assessment & Plan Note (Signed)
[facility]                            [facility] NOTE   HAMMAD, FINKLER                      MRN:          [id number]  DATE:03/29/2006                            DOB:          [birth year]    PRIMARY CARDIOLOGIST:  Learta Codding, MD,FACC   DATE OF OFFICE VISIT:  Schedule plus hospital followup.   Mr. Danzer is a 55 year old male, recently hospitalized at [facility],  following initial transfer from [facility] ER, where he presented  with non ST-elevation myocardial infarction.  He was found to have multi-  vessel coronary artery disease and was initially treated with bare metal  stenting of the culprit right coronary artery.  He then returned for staged  intervention of both the mid-left anterior descending artery and the first  obtuse marginal artery, again with placement of bare metal stent.   Left ventricular function was mildly depressed (EF of 45%) with akinesis of  the mid-inferior wall.   Patient was seen here as an add on September 26, by Joellyn Rued, [facility], for  evaluation of the right groin.  It was felt that there was no evidence of  pseudoaneurysm by examination.  His ecchymosis has since improved.   Patient has felt a bit sluggish since his hospitalization; however, he has  not had any recurrent angina pectoris.  He has not yet returned to work,  pending our clearance.   Patient has also since quit smoking.  He reports compliance with his  medications.   CURRENT MEDICATIONS:  1. Plavix.  2. Coated aspirin 325 q. day.  3. Coreg 6.25 b.i.d.  4. Lisinopril 5 q. day.  5. Cholesterol study drug and Chantix.   PHYSICAL EXAMINATION:  VITAL SIGNS:  Blood pressure 100/54, pulse 60,  regular rate 175.  GENERAL:  A 55 year old male sitting upright, no apparent distress.  NECK:  Palpable carotid pulses without bruits.  LUNGS:  Diminished breath sounds at bases, but without crackles or wheezes.  HEART:  Regular  rate and rhythm. (S1, S2).  No significant murmurs.  ABDOMEN:  Soft, nontender.  EXTREMITIES:  Right groin soft with a small palpable knot; there were soft,  bilateral femoral bruits, diminished, though palpable.  Peripheral pulses  without edema.   IMPRESSION:  1. Multi-vessel coronary artery disease.      a.     Status post non ST elevation myocardial infarction secondary to       acute occlusion of right coronary artery/emergent bare metal stenting.      b.     Staged bare metal stenting of high grade mid-left anterior       descending artery and first obtuse marginal artery.      c.     Mild left ventricular dysfunction (EF of 45%).      d.     Associated non-sustained ventricular tachycardia.  2. Hypertension.  3. Fatigue.      a.     Suspect secondary to #2.  4. Tobacco.      a.     Has since discontinued.  5. Intermittent claudication.  a.     Bilateral femoral bruits.  6. Hyperlipidemia.      a.     Currently on cholesterol study drug.   PLAN:  1. Decrease or correct to 3.125 b.i.d. to help correct the current low      blood pressure.  This will hopefully reduce his easy fatigability      which, I suspect, is secondary to his current low blood pressure.  Of      note, however, we do need to keep him on a beta blocker, given his      presentation of MI and left ventricular dysfunction.  2. Patient is to refrain from returning to work for a full month following      his recent hospitalization.  3. Schedule followup for fasting lipids/liver profile in 3 months.  4. Schedule return clinic, follow up with Dr. Andee Lineman in 3 months.  5. Schedule lower extremity arterial Dopplers (ABIs) to exclude      significant peripheral vascular disease.      ______________________________  Rozell Searing, PA-C    ______________________________  Learta Codding, MD,FACC    GS/MedQ  DD:  04/07/2006  DT:  04/10/2006  Job #:  [id number]   cc:   Kirstie Peri, MD

## 2010-11-19 NOTE — Cardiovascular Report (Signed)
[name], [name]               ACCOUNT NO.:  [id number]   MEDICAL RECORD NO.:  [id number]          PATIENT TYPE:  INP   LOCATION:  6529                         FACILITY:  [facility]   PHYSICIAN:  Veverly Fells. Excell Seltzer, MD  DATE OF BIRTH:  July 31, 1955   DATE OF PROCEDURE:  05/26/2006  DATE OF DISCHARGE:                              CARDIAC CATHETERIZATION   PROCEDURES:  1. PTCA and cutting balloon angioplasty of mid right coronary high-grade      in-stent restenosis.  2. Angio-Seal of the right femoral artery.   INDICATION:  [name] is a very pleasant 55 year old male known to me from  a previous hospital admission, where he underwent acute PCI of his right  coronary artery for a ST elevation MI.  He was treated with a bare metal  stent at that time.  He also had high-grade disease found in his LAD and  left circumflex system that I treated in a staged procedure following his  acute PCI, also with bare metal stents.  He presented today with typical  symptoms of unstable angina and was brought to the cath lab for ongoing  resting chest pain.   His diagnostic catheterization was performed by Dr. Antoine Poche.  This  demonstrated high-grade focal 99% in-stent restenosis in his mid right  coronary artery.  We elected to treat this with planned cutting balloon  angioplasty.   PROCEDURAL DETAILS:  Risks and indications of the procedure were explained  in detail to the patient.  Angiomax was used for anticoagulation.  Once a  therapeutic ACT was achieved, a JR-4 guiding catheter was inserted and the  lesion was crossed with a Cougar coronary guidewire.  The lesion was pre-  dilated with a 2 x 15-mm balloon and this was inflated up to 14 atmospheres.  This created room to insert the cutting balloon.  A 2.75 x 15-mm cutting  balloon was used and inflated to 10 atmospheres on serial inflations to  cover the entire stented segment.  Following cutting balloon angioplasty,  there was an excellent  angiographic result with a widely patent stent and  TIMI III flow.  The patient tolerated the procedure well.  He has diffuse  disease in the right coronary artery but no other areas of high-grade  obstruction.  His femoral arteriotomy was closed with a 6-French Angio-Seal  closure device.   CONCLUSION:  Successful PTCA and cutting balloon angioplasty of the right  coronary artery in-stent restenosis.   The patient should continue on dual antiplatelet therapy with aspirin and  clopidogrel.  He will be given an additional 300 mg of clopidogrel following  the procedure.  Angiomax has been discontinued.  He will require continued  aggressive risk factor modification as he has developed progressive disease  in his LAD and has mild to moderate in-stent restenosis in that vessel and  mild in-stent restenosis of his left circumflex.  He does not have any other  areas of high-grade restenosis or high-grade disease that require  intervention at this time.      Veverly Fells. Excell Seltzer, MD  Electronically Signed  MDC/MEDQ  D:  05/26/2006  T:  05/26/2006  Job:  [id number]

## 2010-11-19 NOTE — Cardiovascular Report (Signed)
[name], Roy               ACCOUNT NO.:  [id number]   MEDICAL RECORD NO.:  [id number]          PATIENT TYPE:  INP   LOCATION:  2011                         FACILITY:  [facility]   PHYSICIAN:  Salvadore Farber, MD  DATE OF BIRTH:  08-Feb-1956   DATE OF PROCEDURE:  09/08/2006  DATE OF DISCHARGE:                            CARDIAC CATHETERIZATION   PROCEDURE:  1. Left heart catheterization.  2. Left ventriculography.  3. Coronary angiography.   INDICATIONS:  Roy [name] is a 55 year old gentleman with atherosclerotic  coronary disease.  He is status post placement of a bare-metal stent in  the first obtuse marginal, bare-metal stent in the right coronary,  subsequently treated with cutting balloon angioplasty for in-stent  restenosis and sandwiched bare-metal and drug-eluting stents in the LAD  after treatment for restenosis.  He now presents with recurrent chest  discomfort with exertion, reminiscent of his prior angina.  He has ruled  out for myocardial infarction by serial enzymes and electrocardiograms.   PROCEDURAL TECHNIQUE:  Informed consent was obtained.  Under 1%  lidocaine local anesthesia, a 5-French sheath was placed in the right  common femoral artery using a modified Seldinger technique.  Diagnostic  angiography and ventriculography were performed using JL-4, JR-4, and  pigtail catheters.  The patient tolerated the procedure well and was  transferred to the holding room in stable condition.   COMPLICATIONS:  None.   FINDINGS:  1. LV:  149/13/18.  EF 65% without regional wall motion abnormality.  2. No aortic stenosis or mitral regurgitation.  3. Left main:  Thirty percent distal stenosis.  4. LAD:  The vessel was diffusely disease with approximately 30%      ostial stenosis.  The previously placed stent in the mid vessel is      widely patent.  A moderate-sized second diagonal has an 80% ostial      stenosis which is unchanged from prior.  There are 2 areas of  50%      to 60% stenosis in the distal vessel.  Both appear unchanged from      prior catheterization in December.  5. Ramus intermedius:  A moderate-sized vessel.  There is a 70%      stenosis proximally.  This is unchanged from prior.  6. Circumflex:  Large codominant vessel giving rise to 2 obtuse      marginals, a PLV and a PDA.  The previously placed stent in the      first marginal is widely patent.  There are only minor luminal      irregularities in the remainder of the vessel.  7. RCA:  Vessel is diffusely diseased.  There is a 50% to 60% stenosis      proximally.  There is then a patent stent in the mid vessel with      less than 50% in-stent restenosis.  Distal vessel has only minor      luminal irregularities.   IMPRESSION/RECOMMENDATIONS:  No significant change in his coronary  disease since prior catheterization in December.  We will plan continued  medical management.  Salvadore Farber, MD  Electronically Signed     WED/MEDQ  D:  09/08/2006  T:  09/09/2006  Job:  [id number]   cc:   Learta Codding, MD,FACC

## 2010-11-19 NOTE — Assessment & Plan Note (Signed)
[facility]                          [facility] NOTE   [name], Roy                      MRN:          [id number]  DATE:06/23/2006                            DOB:          03-Mar-1956    HISTORY OF PRESENT ILLNESS:  The patient is a white male with history of  severe coronary artery disease. The patient is status post recurrent  intervention. She has known multi-vessel CAD. He is status post cutting  balloon angioplasty to a high grade In-Stent restenosis of the right  coronary artery May 26, 2006. He previously underwent Matt Holmes metal  stenting of high grade mid to LAD and first obtuse marginal artery. He  has mild LV dysfunction with an ejection fraction of 45%. He presents  with recurrent substernal chest pain approximately 10 days ago. He went  back to [facility] and underwent repeat catheterization. That  was found to have significant In-Stent restenosis in the LAD and  underwent a drug eluting stent placement. He denies __________ . He has  no recurrent chest pain. He does have mild shortness of breath, which is  chronic. He denies any orthopnea or PND. He has stopped smoking.   MEDICATIONS:  1. Enteric coated aspirin 325 mg daily.  2. Plavix 75 daily.  3. Lisinopril 5 mg p.o. daily.  4. Improve-It study drug.  5. Carvedilol 3.125 1 tablet p.o. b.i.d.   PHYSICAL EXAMINATION:  VITAL SIGNS:  Blood pressure 122/80, heart rate  68 beats per minute. Weight 184 pounds.  NECK:  Normal carotid upstrokes. No carotid bruits.  LUNGS:  Clear breath sounds bilaterally.  HEART:  Regular rate and rhythm. Normal S1 and S2.  ABDOMEN:  Soft.  EXTREMITIES:  No clubbing, cyanosis, or edema.  NEUROLOGIC:  Alert and oriented. Grossly non-focal examination.   PROBLEM LIST:  1. Multi-vessel coronary artery disease.      a.     Status post drug eluting stent placement to the mid left       anterior descending.  2. Status post  cutting balloon to a high grade In-Stent restenosis of      the right coronary artery May 26, 2006.  3. Mild left ventricular dysfunction.  4. Asymptomatic sinus bradycardia.  5. Dyslipidemia.  6. Improve-It study drug.  7. Tobacco use, discontinued.  8. Hypertension, stable.   PLAN:  1. The patient will need life long aspirin and Plavix therapy. This      was reinforced with the patient.  2. The Labetalol will be increased to b.i.d. He is not particularly      bradycardiac in the office today.  3. The patient can use p.r.n. nitroglycerin but I told him that if he      needs more than 1 nitroglycerin, he needs to return back to the      emergency room.  4. The patient can followup with [place] in 6 months.     Learta Codding, MD,FACC  Electronically Signed    GED/MedQ  DD: 06/23/2006  DT: 06/24/2006  Job #: [id number]

## 2010-11-19 NOTE — Assessment & Plan Note (Signed)
[facility]                            [facility] NOTE   TAEVION, SIKORA                      MRN:          [id number]  DATE:04/07/2006                            DOB:          07/16/1955    ADDENDUM:  I need to edit an [facility] office note I just dictated on  Nealy Karapetian. The job number is [id number]. Under plan, please also the last  number put:  Schedule lower extremity arterial Dopplers (ABIs) to exclude  significant peripheral vascular disease.      ______________________________  Rozell Searing, PA-C    ______________________________  Kirstie Peri, MD    GS/MedQ  DD:  04/07/2006  DT:  04/10/2006  Job #:  [id number]

## 2010-11-19 NOTE — Discharge Summary (Signed)
NAMEGIANNI, Roy [name]               ACCOUNT NO.:  [id number]   MEDICAL RECORD NO.:  [id number]          PATIENT TYPE:  INP   LOCATION:  6529                         FACILITY:  [facility]   PHYSICIAN:  Veverly Fells. Excell Seltzer, MD  DATE OF BIRTH:  1955/09/15   DATE OF ADMISSION:  05/26/2006  DATE OF DISCHARGE:  05/27/2006                               DISCHARGE SUMMARY   DICTATED FOR:  Dr. Tonny Bollman.   DATE OF BIRTH:  07/19/2005   PRIMARY CARDIOLOGIST:  Dr. Vernie Shanks. [place].   PRINCIPAL DIAGNOSIS:  Unstable angina pectoris, status post cutting  balloon balloon angioplasty, high grade right coronary artery secondary  to in-stent restenosis.   SECONDARY DIAGNOSES:  1. Coronary artery disease, status post non-ST-elevation myocardial      infarction, 03/2006, treated with staged multivessel stenting.  EF  approximately 45%.  1. Hypertension.  2. Hyperlipidemia  3. Tobacco use.   REASON FOR ADMISSION:  The patient is a 54 year old male, with known 3-  vessel coronary artery disease, status post recent non-ST elevation  myocardial function treated with multivessel coronary artery stenting,  who presented to [facility] ED with recurrent angina pectoris.   He was transferred directly to [facility] for further  evaluation and probable coronary angiography.   HOSPITAL COURSE:  The patient underwent same-day coronary angiography,  by Dr. Tonny Bollman, revealing a high grade in-stent re-stenosis of  the mid- right coronary artery, successfully treated with cutting  balloon angioplasty.  There were no noted complications.   The patient was kept for overnight observation, and cleared for  discharge the following morning in hemodynamically stable condition.   Patient is to resume all previous home medications.  Of note:  Dr.  Excell Seltzer suggested conferring with the research team regarding whether or  not an HDL-raising medication can be added to his current regimen, given  his  HDL of 26.     Of note:  The patient was enrolled in the IMPROVE-IT study.   HOME MEDICATIONS:  1. Plavix 75 mEq daily.  2. Coated aspirin 325 daily.  3. Coreg 3.125 b.i.d.  4. Lisinopril 5 daily.  5. IMPROVE-IT study drug.  6. Nitrostat 0.4, as directed.   DISCHARGE INSTRUCTIONS:  The patient is referred to cardiac  catheterization discharge sheet.   FOLLOWUP:  Dr. Learta Codding, in 2 weeks at the [facility].  Arrangements to be made through our office.      [name], PA-C      Veverly Fells. Excell Seltzer, MD  Electronically Signed    GS/MEDQ  D:  05/27/2006  T:  05/27/2006  Job:  [id number]

## 2010-11-19 NOTE — Discharge Summary (Signed)
Roy [name], Roy [name]               ACCOUNT NO.:  [id number]   MEDICAL RECORD NO.:  [id number]          PATIENT TYPE:  INP   LOCATION:  2011                         FAC[facility]  [facility]   PHYSICIAN:  Jesse Sans. Wall, MD, FACCDATE OF BIRTH:  [birth year]   DATE OF ADMISSION:  09/07/2006  DATE OF DISCHARGE:  09/09/2006                               DISCHARGE SUMMARY   PRIMARY CARDIOLOGIST:  Dr. Lewayne Bunting.   PRINCIPAL DIAGNOSIS:  Chest pain.   SECONDARY DIAGNOSES:  1. Coronary artery, disease status post non-ST-elevation myocardial      infarction in 2007 with bare-metal stenting of the right coronary      artery, first obtuse marginal, and left anterior descending.  Also      status post cutting balloon angioplasty to in-stent restenosis in      the right coronary artery in November of 2007, and Cypher drug-      eluting stent placement to the left anterior descending of October      of 2007.  2. Hyperlipidemia.  3. History of tobacco abuse.  4. Hypertension.  5. Bilateral femoral bruit.  6. Inguinal hernia repair.   ALLERGIES:  CODEINE AND PENICILLIN.   PROCEDURES:  Left heart cardiac catheterization.   HISTORY OF PRESENT ILLNESS:  A 55 year old Caucasian male with prior  history of CAD as outlined above.  Patient was in his usual state of  health until September 07, 2006, when he had sudden onset of substernal chest  heaviness with radiation to the left arm and neck associated with  shortness of breath.  Pain was not relieved by nitroglycerin at home and  he subsequently presented to [facility] where he was treated with  sublingual nitroglycerin as well as nitroglycerin infusion and  subcutaneous Lovenox.  His pain was improved but not completely relieved  and he was transferred to [facility] for further evaluation.   HOSPITAL COURSE:  The patient ruled out for MI.  EKG was without any  acute changes.  Given the recurrence of his discomfort with prior  history of in-stent  restenosis and similarity in symptoms, the decision  was made to pursue left heart cardiac catheterization which took place  on September 08, 2006, revealing patency of the stents in the LAD, obtuse  marginal, and RCA.  Otherwise, nonobstructive coronary artery disease.  EF was 65% without regional wall-motion abnormalities.  At this point he  has been ambulating without any chest discomfort.  The patient is being  discharged home today in satisfactory condition.   DISCHARGE LABORATORIES:  Hemoglobin 13, hematocrit 37.1, WBC 7.8,  platelets 177.  Sodium 138, potassium 3.6, chloride 109, CO2 25, BUN 17,  creatinine 0.61, glucose 88, calcium 8.4, total bilirubin 0.8, alkaline  phosphatase 48, AST 16, ALT 22, total protein 6.3, albumin 3.2,  magnesium 2.1.  CK 60, MB 1.2.  Troponin I is 0.02.   DISPOSITION:  Patient is being discharged home today in good condition.   FOLLOWUP PLANS AND APPOINTMENTS:  We will arrange for followup with Dr.  Andee Lineman in approximately two weeks in our [place] office.  DISCHARGE MEDICATIONS:  1. Plavix 75 mg daily.  2. Aspirin 81 mg daily.  3. Lisinopril 5 mg daily.  4. Coreg 6.25 mg b.i.d.  5. Flexeril 10 mg q.p.m.  6. Xanax 0.5 mg b.i.d. p.r.n.  7. IMPROVE-IT study drug nightly.  8. Nitroglycerin 0.4 mg sublingual p.r.n. chest pain.   OUTSTANDING LAB STUDIES:  None.   DURATION OF DISCHARGE ENCOUNTER:  Thirty five minutes, including  physician time.      Nicolasa Ducking, ANP      Jesse Sans. Daleen Squibb, MD, [facility]  Electronically Signed    CB/MEDQ  D:  09/09/2006  T:  09/10/2006  Job:  [id number]

## 2010-11-19 NOTE — H&P (Signed)
Roy [name], THUM               ACCOUNT NO.:  [id number]   MEDICAL RECORD NO.:  [id number]          PATIENT TYPE:  INP   LOCATION:  1824                         FACILITY:  [facility]   PHYSICIAN:  Rod Holler, MD     DATE OF BIRTH:  [birth year]   DATE OF ADMISSION:  09/07/2006  DATE OF DISCHARGE:                              HISTORY & PHYSICAL   PRIMARY CARDIOLOGIST:  Learta Codding, MD,FACC.   HISTORY OF PRESENT ILLNESS:  Mr. Swindle is a 55 year old male with a  history of coronary artery disease who presented to an outside hospital  with complaints of chest pain.  The patient had intermittent chest pain  yesterday that was relieved by sublingual nitroglycerin.  The patient  had recurrent pain today.  He states that it feels like lead on his  chest, it started around noon, radiation to his left upper extremity and  neck with associated shortness of breath.  The pain was not relieved by  sublingual nitroglycerin at home.  The pain was worsened with exertion.  It was improved with nitroglycerin drip at the outside hospital.  The  patient had no associated nausea, diaphoresis.  He has had no syncope or  presyncope, no PND or orthopnea, no lower extremity swelling.  At the  outside hospital, the patient received sublingual nitroglycerin x3, was  started on a nitroglycerin drip, received Lovenox 80 mg subcutaneous.  Currently, the patient says his pain is improved but not completely  relieved.   PAST MEDICAL HISTORY:  1. Coronary artery disease, status post non-ST elevation MI in 2007,      status post bare metal stent Vision 2.75 x 28 to the RCA, Vision      bare metal stent 2.75 x 28 to OM-1, Vision bare metal stent 3 x 8      to the mid LAD, status post cutting balloon angioplasty to an in-      stent restenosis of the RCA stent in November 2007, status post 3 x      8 CYPHER stent placement to the LAD and October 2007 to an in-stent      restenosis.  2. Hyperlipidemia.  3.  History of tobacco use.  4. Hypertension.  5. Ejection fraction 45%.  6. Bilateral femoral bruits.  7. Inguinal hernia repair.   MEDICATIONS:  1. Aspirin 325 mg p.o. daily.  2. Coreg 3.125 mg p.o. b.i.d.  3. Plavix 75 mg p.o. daily.  4. Lisinopril 5 mg p.o. daily.  5. Improve It study drug.   ALLERGIES:  1. CODEINE.  2. PENICILLIN.   SOCIAL HISTORY:  The patient is a Curator, is a former smoker.   FAMILY HISTORY:  Father died of cancer at age 49.  Mother with a history  of hypertension and diabetes.   REVIEW OF SYSTEMS:  All systems were reviewed in detail and are negative  except as noted in the history of present illness.   PHYSICAL EXAMINATION:  VITAL SIGNS:  Heart rate in the 70s, blood  pressure 100s over 60s.  GENERAL:  A well developed well nourished male,  alert and oriented x3,  no apparent distress.  HEENT:  Atraumatic, normocephalic.  Pupils are equal, round, and  reactive to light.  Extraocular movements intact.  NECK:  Supple.  No adenopathy.  No JVD.  NO carotid bruits.  CHEST:  Lungs clear to auscultation bilaterally.  COR:  Regular rhythm.  Normal rate.  Normal S1 S2.  No murmurs, rubs, or  gallops.  Soft bilateral femoral bruits.  ABDOMEN:  Soft, nontender, nondistended.  Active bowel sounds.  EXTREMITIES:  No clubbing, cyanosis, or edema.  NEUROLOGIC:  No focal deficits.   OUTSIDE HOSPITAL LABORATORY:  BNP 19.  Sodium 135, potassium 3.5,  chloride 110, bicarb 22, BUN 8, creatinine 0.7, glucose 107.  INR 1.  White blood cell count 8.1, hematocrit 39.8, platelet count 210.  CK of  82, CK-MB 1.6, troponin 0.02.  EKG shows a normal sinus rhythm with  nonspecific ST-T wave changes, questionable old inferior infarct.   IMPRESSION:  A 55 year old male with symptoms concerning for an acute  coronary syndrome.   PLAN:  1. Cardiovascular.  Admit the patient to a stepdown unit,      nitroglycerin drip to titrate to pain free, rule out with serial      cardiac  enzymes, a daily EKG, daily aspirin, a Statin, ACE      inhibitor, Coreg, Plavix which the patient took today, morphine      p.r.n. pain, continue Lovenox.  If the patient rules in for an MI      or has continued pain, we will start Integrilin.  The patient will      likely need cardiac catheterization.  2. Pulmonary.  Check a chest x-ray.  3. Fluids, electrolytes, nutrition.  CMP and magnesium in the morning.      Cardiac diet.  NPO after midnight.  Normal saline at 75 cc/hr.  4. Hematologic.  Guaiac all stools.      Rod Holler, MD  Electronically Signed     TRK/MEDQ  D:  09/07/2006  T:  09/07/2006  Job:  [id number]

## 2010-11-19 NOTE — H&P (Signed)
NAMESTELLA, ENCARNACION               ACCOUNT NO.:  [id number]   MEDICAL RECORD NO.:  [id number]          PATIENT TYPE:  INP   LOCATION:  6529                         FACILITY:  [facility]   PHYSICIAN:  Nicolasa Ducking, ANP DATE OF BIRTH:  Nov 05, 1955   DATE OF ADMISSION:  05/26/2006  DATE OF DISCHARGE:                                HISTORY & PHYSICAL   PRIMARY CARDIOLOGIST:  Dr. Lewayne Bunting.   PATIENT PROFILE:  A 55 year old white male with prior history of CAD and non-  ST elevation MI with PCI of multiple vessels in September 2007 who presents  with recurrent angina.   PROBLEM LIST:  1. CAD.      a.     Status post non-ST elevation MI March 21, 2006.      b.     Cardiac catheterization March 22, 2006 revealing left main       30-40% distal.  LAD 80% mid.  D1 70% ostial.  Ramus intermedius 60-70%       diffusely diseased.  OM1 80% proximal.  RCA 100% mid.  The RCA was       felt to be the culprit vessel and was successfully stented with a 2.75       x 28 mm Vision bare metal stent.      c.     March 24, 2006 PCI and stenting of the proximal first obtuse       marginal with a 2.75 x 28 mm Vision bare metal stent.  Also stenting       of the mid LAD with a 3.0 x 8 mm Vision bare metal stent.  2. Tobacco abuse.  3. Ischemic cardiomyopathy, EF of 45%.  4. Hypertension.  5. Hyperlipidemia.  6. Intermittent claudication of the bilateral femoral bruits and recent      AVI's which were reportedly normal.  7. Status post inguinal hernia repair.  8. Remote tobacco abuse.   HISTORY OF PRESENT ILLNESS:  A 55 year old white male with history of CAD  and non-ST elevation MI in September 2007, status post staged PCI of the  RCA, LAD, and OM1 at that time.  He was in his usual state of health until  approximately 3 days ago when he began to experience intermittent rest and  exertional 7-8 out of 10 substernal chest pressure with shortness of breath  and mild diaphoresis, radiating to  the bilateral jaws,  lasting anywhere  from 5-60 minutes and resolving with 1 sublingual nitroglycerine the other  night.  Otherwise, symptoms were resolving spontaneously or with rest.  Symptoms are similar to previous angina although not as severe.  He had  recurrent chest pain at work this morning and was urged to present to the  [facility] ED where cardiac markers were negative x1 and ECG was without any  acute changes.  A decision was made to transfer him to [facility] secondary  to continued pain and he was treated with multiple doses of morphine as well  as placed on IV nitro drip at 30 mic's a minute.  He is currently pain free,  although says that it feels like the pain might be coming back.   ALLERGIES:  PENICILLIN and CODEINE.   HOME MEDICATIONS:  1. Plavix 75 mg daily.  2. Aspirin 325 mg daily.  3. Coreg 3.125 mg b.i.d.  4. Lisinopril 5 mg daily.  5. Improve-It, study drug, daily.   FAMILY HISTORY:  Mother is age 47 with a history of hypertension, diabetes.  Father died at age 54 of cancer.  He had a sister who died of complications  of COPD.  He has 4 brothers who are reasonably alive and well, although 1  does have hypertension and diabetes.   SOCIAL HISTORY:  Lives in [place] with his wife, he works as a Advice worker,  he has 2 children of his own, he has 45 pack year history of tobacco abuse,  smoking 1-1/2 packs a day for 30 years, quitting in September of 2007  following his MI.  He denies any alcohol or drugs and does not routinely  exercise.   REVIEW OF SYSTEMS:  Positive for chest pain, shortness of breath, cough,  bilateral leg pain.  All other systems reviewed are negative.   PHYSICAL EXAMINATION:  Temperature 97.1, heart rate 52, respirations 20,  blood pressure 96/58, pulse ox 98% on 4L.  Pleasant white male in no acute  distress.  Awake, alert, and oriented x3.  NECK:  No bruits or JVD.  LUNGS:  Expirations are regular and unlabored.  Clear to  auscultation.  CARDIAC:  Regular, S1, S2, no S3, S4, or murmurs.  ABDOMEN:  Round, soft, non tender, non distended, bowel sounds present x4.  EXTREMITIES:  Warm, dry, pink, no clubbing, cyanosis, or edema.  Dorsalis  pedis, posterior tibial pulses 1+ and equal bilaterally.  He does have  bilateral femoral bruits which are soft.  Chest x-ray is pending.  EKG shows sinus bradycardia with a rate of 52, he  has T-wave inversion on Lead 3.   LABORATORY DATA:  Hemoglobin 14.2, hematocrit 40.7, WBC 7.6, platelets 204,  sodium 137, potassium 4.0, chloride 107, SGOT 23, BUN 13, creatinine 0.8,  glucose 98, CK 125, MB 2.5, troponin I less than 0.01, PTT 23.4, PT 11.4,  INR 0.9, calcium 9.2.   ASSESSMENT/PLAN:  1. Unstable angina/coronary artery disease.  The patient presents with      recurrent angina for the past 3 days, similar to his previous angina      only not as severe.  Symptoms were responsive to nitroglycerine the      other night.  He has had persistent today which is currently at bay      following large doses of morphine and IV nitro.  Plan is for cardiac      catheterization this evening.  Will continue his aspirin, Plavix, beta      blocker, ACE inhibitor, and statin.  2. Hypertension.  Is well controlled.  Of note, the patient has developed      a cough which he describes as sort of dry and nagging.  Question      whether or not this might be from the Lisinopril.  He says it is not so      annoying that he would come off of the Lisinopril in exchange for a      high priced ARB.  For the time being, will continue Lisinopril but I      advised him to keep track of his cough and if at any point it becomes  too much, we could always switch drugs.  3. Hyperlipidemia.  He is enrolled in the Improve It study and will      continue that drug.  4. Tobacco abuse.  He quit in September and was congratulated on this.      Continued smoking cessation advised. 5. History of bilateral  intermittent claudication and leg pain.      Apparently, he underwent ABI's recently and he was told these were      normal.  Will try to get our hands on those records from the [place]      office.  6. Ischemic cardiomyopathy.  He appears euvolemic.  Previously documented      EF 45%.  Continue beta blocker and ACE inhibitor.     Nicolasa Ducking, ANP    CB/MEDQ  D:  05/26/2006  T:  05/27/2006  Job:  [id number]

## 2010-11-19 NOTE — Discharge Summary (Signed)
Roy [name], Roy [name]               ACCOUNT NO.:  [id number]   MEDICAL RECORD NO.:  [id number]          PATIENT TYPE:  INP   LOCATION:  6532                         FACILITY:  [facility]   PHYSICIAN:  Veverly Fells. Excell Seltzer, MD  DATE OF BIRTH:  22-May-1956   DATE OF ADMISSION:  03/21/2006  DATE OF DISCHARGE:  03/24/2006                                 DISCHARGE SUMMARY   PRIMARY CARDIOLOGIST:  Will be Learta Codding, MD,FACC   PRINCIPAL DIAGNOSES:  1. Non-ST-elevation myocardial infarction.  2. Coronary artery disease.   SECONDARY DIAGNOSES:  1. Hyperlipidemia.  2. Tobacco abuse.  3. Ischemic cardiomyopathy, ejection fraction 45%.  4. History of inguinal hernia repair.   ALLERGIES:  1. PENICILLIN.  2. CODEINE.   PROCEDURES:  1. Left heart cardiac catheterization with PCI and stenting of the right      coronary artery with placement of a 2.75 x 28-mm Multilink Vision bare      metal stent, and subsequent PCI and stenting of the LAD with a 3 x 8-mm      Multilink Vision bare metal stent, and stenting of the first obtuse      marginal with a 2.75 x 28-mm Multilink Vision bare metal stent.   HISTORY OF PRESENT ILLNESS:  This 55 year old white male with no prior  history of CAD who presented to [facility] with complaints of chest  pain on March 21, 2006, after he had sudden onset of 10/10 left sided  discomfort while mowing his lawn.  Symptoms were associated shortness of  breath, nausea and diaphoresis, thus prompting his ER visit, where he was  noted to have inferior T-wave inversion with inferior Qs.  He was initiated  on anticoagulation therapy and was treated with nitroglycerin, morphine, and  aspirin, and transferred to [facility] for further evaluation.   HOSPITAL COURSE:  Mr. Loudermilk ruled in for non-ST-elevation MI with a peak CK  of 969, peak MB of 120.6, and a peak troponin-I of 10.61.  He underwent left  heart cardiac catheterization on September 19,  revealing a totally occluded  mid right coronary artery which was felt to be the infarct vessel and  successfully stented with a 2.75 x 28-mm Multilink Vision bare metal stent.  He was also noted to have an 80% stenosis in the proximal LAD as well as an  80% stenosis in the proximal first obtuse marginal.  His EF was 45% with mid  inferior wall akinesis.  Decision was made to monitor him in the ICU  following stenting of the RCA and he did not have any recurrent chest pain.  He was initiated on beta blocker, aspirin, Plavix, ACE inhibitor and Statin  therapy which he has tolerated to this point.  He was taken back to the cath  lab on September 21, and underwent successful stenting of the LAD with a 3 x  8-mm Multilink Vision bare metal stent.  Attention was then turned to the  first obtuse marginal which was also successfully stented with a 2.75 x 28-  mm Multilink Vision bare metal stent.  He  has been ambulating post procedure without any recurrent discomfort or  recurrent angina or limitations and as a result is being discharged home on  March 25, 2006, in satisfactory condition.   DISCHARGE LABORATORY:  Hemoglobin 13, hematocrit 36.9, WBC 10.3, platelets  192.  PT 13.6, INR 1, PTT 29.  Sodium 141, positive 3.8, chloride 111, CO2  26, BUN 95, creatinine 0.8, glucose 98, calcium 8.9, magnesium 2.2.  Total  bilirubin 1.1, alkaline phosphatase 64, AST 61, ALT 25.  Hemoglobin A1c 5.4.  CK 273, MB 16.1, troponin I 4.80.  Total cholesterol 155, triglycerides 65,  HDL 26, LDL 116.   DISPOSITION:  The patient is being discharged home in good condition.   FOLLOW-UP APPOINTMENTS:  1. We will arrange for follow-up with Dr. Andee Lineman in [place] as the patient      lives in [place], in approximately 2 weeks.  2. He is also advised to obtain a primary care physician in the [place] area.   DISCHARGE MEDICATIONS:  1. Aspirin 325 mg every day.  2. Plavix 75 mg every day.  3. Coreg 625 mg b.i.d.  4.  Lisinopril 5 mg every day.  5. __________  study drug every day.  6. Nitroglycerin 0.4 mg sublingual p.r.n. chest pain.  7. Chantix 0.5 mg b.i.d. x4 days, then 1 mg b.i.d. times 12 weeks.   The patient has been counseled on the importance of smoking cessation as  well as the importance a low-fat cholesterol and salt diet.  He has been  advised to weigh himself daily and record any weight gain greater than 2  pounds in 1 day or 5 pounds in 1 week.   OUTSTANDING LABORATORY STUDIES:  None.   DURATION DISCHARGE ENCOUNTER:  Forty-five minutes including, physician time.     ______________________________  Nicolasa Ducking, ANP      Veverly Fells. Excell Seltzer, MD  Electronically Signed    CB/MEDQ  D:  03/24/2006  T:  03/27/2006  Job:  [id number]   cc:   Learta Codding, MD,FACC

## 2010-11-19 NOTE — Cardiovascular Report (Signed)
NAMEHOLTEN, SPANO               ACCOUNT NO.:  [id number]   MEDICAL RECORD NO.:  [id number]          PATIENT TYPE:  INP   LOCATION:  2909                         FACILITY:  [facility]   PHYSICIAN:  Veverly Fells. Excell Seltzer, MD  DATE OF BIRTH:  10/31/1955   DATE OF PROCEDURE:  03/24/2006  DATE OF DISCHARGE:                              CARDIAC CATHETERIZATION   PROCEDURE PERFORMED:  Stenting of the mid left anterior descending, stenting  of the first obtuse marginal, and Angio-Seal closure of the right femoral  artery.   INDICATION:  Mr. Eisler is a very pleasant, 55 year old male who has never  had regular medical care.  He presented on September 18 with some acute  inferior ST elevation myocardial infarction.  He was treated emergently with  primary angioplasty of his right coronary artery and a bare metal stent was  placed in this vessel with a good angiographic result.  At the time of his  catheterization, he was noted to have high grade focal disease in the mid  LAD as well as a high-grade long lesion in the first obtuse marginal.  Both  vessels were stenosed approximately 80%.  He also had a diffusely diseased  ramus intermedius that was a very small diameter vessel not amenable to  revascularization.   In light of his multivessel disease, different revascularization strategies  were considered.  In the setting of a stent placement in the right coronary  artery and focal disease in the other vessels, I elected to bring him back  for a multivessel angioplasty.  To perform CABG, he would need interruption  of the clopidogrel and that would put him at high risk for stent thrombosis.  Therefore, I elected to treat him percutaneously, as he is nondiabetic and  has focal disease amenable to angioplasty.   PROCEDURAL DETAILS:  Risks and indications of procedure were reviewed with  the patient.  Informed consent was obtained.  Right groin was prepped and  draped in normal sterile fashion,  anesthetized with 1% lidocaine.  Using a  modified Seldinger technique, a 6 French arterial sheath was placed in the  right common femoral artery.  A 6 French CLS 3.5 mm guiding catheter was  used to engage the left coronary artery.  Initially angiographic views were  taken.  A BMW coronary guidewire was placed in the LAD and was used to cross  the mid LAD lesion.  The LAD lesion was very focal and was treated with a  3.0 x 8 mm Vision stent.  The stent was deployed at 10 atmospheres.  Following this, intracoronary nitroglycerin was given and angiography  demonstrated an excellent angiographic result with no residual stenosis.  There was a significant stepdown off the distal end of the stent.  Because  of the stepdown, I decided not to post dilate the stent.  I then placed the  wire across the lesion in the first obtuse marginal, which was a longer  lesion, and it was primarily stented with a 2.75 x 28 Vision stent.  In the  proximal end of the stent at the site of the  high-grade lesion, there was a  waste present and I post-dilated this area with a 2.75 x 20 noncompliant  balloon up to 20 atmospheres.  Following post-dilatation, there was an  excellent angiographic result with no residual stenosis.  Angiomax was  discontinued and the common femoral artery was sealed with a 6 [place] Angio-  Seal device.   The patient will be continued on aspirin and clopidogrel for a minimum of 1  month and, preferably, up to 1 year if he is able to obtain the medication  and be compliant.  There is some question about his medical compliance and  that is why I elected to use bare metal stenting in this case.   The patient should be ready for discharge tomorrow if no problems arise.  I  will followup with him in clinic in approximately 2 to 4 weeks.      Veverly Fells. Excell Seltzer, MD  Electronically Signed     MDC/MEDQ  D:  03/24/2006  T:  03/26/2006  Job:  [id number]

## 2010-11-19 NOTE — Consult Note (Signed)
NAMERAIDER, VALBUENA               ACCOUNT NO.:  [id number]   MEDICAL RECORD NO.:  [id number]          PATIENT TYPE:  INP   LOCATION:  6529                         [facility]AC[facility]  [facility]   PHYSICIAN:  Rollene Rotunda, MD, FACCDATE OF BIRTH:  [birth year]   DATE OF CONSULTATION:  DATE OF DISCHARGE:                                 CONSULTATION   PRIMARY CARE PHYSICIAN:  Kirstie Peri, MD.   PROCEDURE:  Left heart catheterization/coronary arteriography.   INDICATIONS:  A patient with unstable angina.  He has had previous  stenting of his right coronary artery, circumflex marginal and LAD.  These are all bare metal stents.   PROCEDURE NOTE:  Left heart catheterization performed via the right  femoral artery.  The aorta was cannulated using anterior wall puncture.  A #6-French arterial sheath was inserted via the modified Seldinger  technique.  A pre-formed Judkins and a pigtail catheter were utilized.  The patient tolerated the procedure well and left the lab in stable  condition.   RESULTS:  Hemodynamics:  LV 125/15, AO 132/98.  Coronaries:  The left  main had a distal 30% stenosis.  The LAD had a proximal stent with a 25%  stenosis before the stent, 40% stenosis within the stent.  There was a  50% mid-vessel disease and a long 40% distal stenosis.  First diagonal  was moderate with a long 60% stenosis.  Second diagonal was moderate  size with ostial 75% stenosis.  The circumflex in the AV groove was  normal.  The ramus intermediate was small with diffuse 60-70% stenosis.  There was a large mid-obtuse marginal with proximal long 25% stenosis.  There is a mid-stent which was widely patent.  There were three small  posterolaterals free of high-grade disease.  Right coronary artery:  The  right coronary artery is a dominant vessel.  There is a mid-stent with  99% stenosis within the stent.  There was a large acute marginal branch  which had luminal irregularities but was free of high-grade  disease.  PDA was moderate and normal.  Left ventriculogram:  No left  ventriculogram obtained in the RAO projection.  EF was 55% with mild  inferior hypokinesis.   CONCLUSION:  Severe single-vessel coronary artery disease.   PLAN:  The patient occurred percutaneous revascularization of the right  coronary artery, per Dr. Excell Seltzer.      Rollene Rotunda, MD, [facility]  Electronically Signed     JH/MEDQ  D:  05/26/2006  T:  05/26/2006  Job:  [id number]   cc:   Kirstie Peri, MD  Learta Codding, MD,FACC

## 2010-11-19 NOTE — Cardiovascular Report (Signed)
NAMECOAL, NEARHOOD               ACCOUNT NO.:  [id number]   MEDICAL RECORD NO.:  [id number]          PATIENT TYPE:  INP   LOCATION:  6532                         FACILITY:  [facility]   PHYSICIAN:  Veverly Fells. Excell Seltzer, MD  DATE OF BIRTH:  Sep 03, 1955   DATE OF PROCEDURE:  DATE OF DISCHARGE:  06/13/2006                            CARDIAC CATHETERIZATION   PROCEDURE:  1. Left heart catheterization.  2. Selective coronary angiography.  3. Left ventricular angiography.  4. I VUS of the LAD.  5. Drug-eluting stent placement to the LAD, Angio-Seal of the left      femoral artery.   INDICATION:  Mr. Shoultz is a very pleasant 55 year old male well known  to me from previous hospital visits who presents with recurrent unstable  angina.  He has noted his typical cardiac chest pain over the past one  week both at rest and worse with exertion.  He is ruled out for  myocardial infarction, but in the setting of his typical symptoms and  known coronary artery disease he was referred for diagnostic cardiac  catheterization.  Mr. Sackrider has previously had an inferior myocardial  infarction and underwent primary PCI with a bare metal stent in his  right coronary artery.  He underwent a staged procedure a few days later  with bare metal stents in his mid-LAD and first obtuse marginal branch  of his left circumflex.  He presented a few weeks ago with high-grade in-  stent restenosis of the right coronary artery that was treated with some  cutting balloon angioplasty.   PROCEDURAL DETAILS:  The risks and indications of the procedure were  explained in detail to the patient.  Informed consent was obtained.  The  left groin was prepped, draped, and anesthetized with 1% lidocaine.  Using the modified Seldinger technique, a 6-French sheath was placed in  the left femoral artery.  Multiple usable left and right coronary  arteries were taken.  For the left coronary artery, a JL4 catheter was  used.  For the  right coronary artery, a JR4 catheter was initially used,  and then a 3-DRC catheter was ultimately used.  Following selective  coronary angiography, an angled pigtail catheter was inserted into the  left ventricle, and ventricular pressures were recorded.  A 30-degree  right anterior oblique left ventriculogram was performed.  A pullback  across the aortic valve was done.   Following the diagnostic portion of the procedure, there was moderate in-  stent restenosis of the mid-LAD stent.  This was compared to his  previous catheterization from two weeks ago, and there may have been  slight progression.  The previously treated area in the mid-right  coronary artery was widely patent.  There was no other area that  appeared to be a culprit vessel, and with the patient's compelling  symptoms, I elected to perform intravascular ultrasound of this vessel.  Angiomax was used for anticoagulation.  Once the therapeutic ACT was  achieved, a CLS 3.5-mm guiding catheter was inserted, and a cougar  coronary guidewire was passed beyond the lesion into the distal LAD.  Intravascular  ultrasound demonstrated high-grade focal restenosis with  significant neointimal proliferation and a minimal lumen area of 3 mm2.  The percent stenosis was approximately 75%.  The lesion was treated with  a 3.0 x 8-mm Cypher stent which was deployed at 14 atmospheres.  Post  stent intravascular ultrasound demonstrated excellent stent apposition  throughout.  There was TIMI-3 flow at the conclusion.  The wire and  guiding catheter were removed via stent at this point.   At the conclusion of the case, an Angio-Seal device was deployed in the  left femoral artery for closure of the arteriotomy site.   FINDINGS:  1. Aortic pressure 147/81 with a mean of 107, left ventricular      pressure 151/17 with an end-diastolic pressure of 20.  2. Coronary angiography.  The left main stem is angiographically      normal throughout its  proximal mid portion.  There is a distal 20%      stenosis.  The left main stem trifurcates into the LAD, ramus      intermedius, and left circumflex.  The LAD is a large caliber      vessel that courses down the left ventricular apex.  There is a      stented segment between the first and second diagonal branches.      There is moderate  restenosis in the mid portion of the stent.  The      mid to distal LAD has an area of focal 50%.  The remainder of the      vessel does not have any significant angiographic disease.  There      are two medium caliber diagonals from the LAD that did not have any      significant angiographic disease.  3. Ramus intermedius.  This is a small branch that has no significant      angiographic disease.  4. Left circumflex.  The left circumflex system is medium caliber.  It      gives off a large first obtuse marginal branch and has a stented      segment with only mild restenosis that is clearly nonobstructive.      The mid-left circumflex beyond the ramus branch is small caliber      and gives off few posterolateral branches.  5. The right coronary artery is dominant.  Proximally, there is      nonobstructive disease present.  There is a widely patent stent in      the mid portion of the right coronary artery.  There is a large      acute marginal branch present.  The right coronary artery stems      down and gives off the PDA branch with no significant angiographic      disease.  6. Left ventricular function as assessed by left ventriculography is      normal.  The estimated LVEF is 65%.   ASSESSMENT:  1. Three-vessel coronary artery disease with widely patent stents in      the right coronary artery and left circumflex.  There is moderate      LAD in-stent restenosis.  2. Normal left ventricular function.   PLAN:  As described above, intravascular ultrasound and drug-eluting stent implantation in the mid-LAD was performed with an excellent   angiographic result.   FINAL DIAGNOSES:  Successful stenting of focal in-stent restenosis in  the mid-LAD.  I recommend life-long dual anti-platelet therapy with  aspirin and Clopidogrel.  Will add  nitrates to the patient's medical  regimen.  Otherwise, continue current therapy.      Veverly Fells. Excell Seltzer, MD  Electronically Signed     MDC/MEDQ  D:  06/12/2006  T:  06/13/2006  Job:  [id number]

## 2010-11-19 NOTE — Assessment & Plan Note (Signed)
[facility]                            Roy [name]   Roy, [name]                      MRN:          [id number]  DATE:03/29/2006                            DOB:          1955/08/30    SUMMARY OF HISTORY:  Roy [name] is a 55 year old white male who is added  into our office schedule today with concerns about bruising at his  catheterization site.   Roy [name] was recently hospitalized at [facility] from September 18 to September  21 with non ST-segment elevated myocardial infarction being transferred from  [facility].  He underwent initial cardiac catheterization with  stenting x3 with bare metal stents to the LAD and staged stenting to the ON  with a bare metal stent.  During admission he was also treated for  hyperlipidemia and tobacco use.   Since his discharge on Saturday the 21st, Roy [name] states he has been  doing fairly well.  He states that he is very stressed because he is not  working at this time and about the recent events of his hospitalization.  He  also was slightly concerned about his catheterization site as this has  developed bruising and he wanted somebody to look at this.  He states that  he has not smoked since discharge.   PAST MEDICAL HISTORY:  _____.   MEDICATIONS:  Include:  1. Aspirin 325 q. day.  2. Plavix 75 q. day.  3. Coreg 6.25 b.i.d.  4. Lisinopril 5 mg q. day.  5. Cholesterol study drugs.  6. Chantix.  7. Unknown dosage nitroglycerin as needed.   PAST MEDICAL HISTORY:  In addition to above shows an EF of 45%.  History of  right inguinal hernia repair.   ALLERGIES:  Do include PENICILLIN  and CODEINE.   PHYSICAL EXAMINATION:  Blood pressure 122/52.  Pulse 60 and regular.  Weight  175.  Brief physical exam of catheterization site right groin shows evolving  ecchymosis over the lower right abdominal quadrant and upper right thigh.  He does have some slight swelling at the  catheterization site, approximately  4 or 5 cm in diameter which is soft and nontender. He does not have any  bruits.  Ecchymosis appears to be evolving.  He denies any scrotal swelling  or leg discomfort.  Peripheral pulses are symmetrical and intact.   IMPRESSION:  1. Right groin hematoma with evolving ecchymosis without evidence of      pseudoaneurysm by exam.  2. Recent non ST-segment elevated myocardial infarction with stenting to      the left anterior descending and OM as previously described.  3. Recent tobacco cessation.  4. Hyperlipidemia, current study drug.  5. Anxiety.   PLAN:  I reassured Mr. Cropper in regards to his catheterization site as well  as reviewed his medications and answered questions about his recent  admission.  He had further questions about stress, anxiety, smoking and  returning to work.  We had a long conversation about stress and anxiety post  hospitalizations with myocardial infarction as well as continued tobacco  cessation.  When  he follows up with Dr. Andee Lineman on October 5, if he continues  to do well I anticipate him returning to work after that appointment.  Consideration should also be referring him to Cardiac Rehabilitation.      ______________________________  Joellyn Rued, PA-C    ______________________________  Learta Codding, MD,FACC    EW/MedQ  DD:  03/29/2006  DT:  03/31/2006  Job #:  [id number]

## 2010-11-19 NOTE — Cardiovascular Report (Signed)
NAMEDELANDO, SATTER               ACCOUNT NO.:  [id number]   MEDICAL RECORD NO.:  [id number]          PATIENT TYPE:  INP   LOCATION:  2909                         FACILITY:  [facility]   PHYSICIAN:  Veverly Fells. Excell Seltzer, MD  DATE OF BIRTH:  [birth year]   DATE OF PROCEDURE:  03/22/2006  DATE OF DISCHARGE:                              CARDIAC CATHETERIZATION   PROCEDURES:  1. Left heart catheterization.  2. Selective coronary angiography.  3. Left ventricular angiography.  4. Percutaneous transluminal coronary angioplasty and stenting of the mid-      right coronary artery.  5. Star Close of the right femoral artery.   INDICATIONS:  Mr. Piech is a very pleasant 55 year old male with no past  medical history, who presented with the acute onset of chest pain that  started yesterday afternoon.  He has had intermittent pain over the past few  days but has had constant pain for approximately 12 hours on presentation.  He has been diagnosed with a non-ST segment myocardial infarction and was  initially treated with heparin and Integrilin.  He was pain-free temporarily  but then developed recurrent chest pain with hypotension and multiple runs  of nonsustained ventricular tachycardia.  In the setting of his recurrent  chest pain despite aggressive medical therapy, hemodynamic instability and  nonsustained ventricular tachycardia, he was brought emergently for cardiac  catheterization.   PROCEDURAL DETAILS:  Risks and indications of the procedure were explained  in detail to the patient.  Informed consent was obtained.  The right groin  was prepped and draped and anesthetized with 1% lidocaine under normal  sterile conditions.  Using the modified Seldinger technique, a 6 French  arterial sheath was placed in the right common femoral artery.  Multiple  angiographic views were taken of the right and left coronary arteries.  A 6  [place] JR-4 catheter was used for the right coronary artery and a 6  [place]  JL-4 catheter was used for the left coronary artery.  Following selective  coronary angiography, a left ventriculogram was performed after inserting  the pigtail catheter in the left ventricle and measuring left ventricular  pressures.  Following left ventriculography, a pullback was performed across  the aortic valve.  Following the diagnostic procedure, percutaneous coronary  intervention of the right coronary artery was performed.  A 6 French side  hole JR-4 guiding catheter was used.  Coronary guidewires included a 300 cm  Cougar and a 190 cm BMW.  A 2.5 x 15 mm balloon was used and a 2.75 x 28 mm  Vision stent was used.  See below for details.   FINDINGS:  Aortic pressure was 129/76 with a mean of 97.  Left ventricular  pressure was 126/21 with an end-diastolic pressure of 25.   CORONARY ANGIOGRAPHY:  The right coronary artery was diffusely diseased on  the proximal segment and was occluded in the mid-right coronary artery.  There was some faint late filling of the distal right coronary artery.   Left coronary artery:  The left mainstem has 30-40% disease in its distal  segment.  The proximal and  midbody of the left mainstem are normal.  The  left mainstem trifurcates into the LAD, left circumflex and ramus  intermedius.  The LAD is a large-diameter vessel that courses down and wraps  around the left ventricular apex.  It gives off two proximal diagonal  branches.  The second diagonal branch is a large-diameter vessel that has a  diffuse 70% stenosis.  Just beyond the second diagonal there is a focal 80%  stenosis of the mid-LAD.  The remainder of the LAD has nonobstructive  disease present.   The ramus intermedius is small-diameter.  It has diffuse 60-70% stenosis  throughout.   Left circumflex is a large-diameter vessel.  It gives off a large first  obtuse marginal branch that has an 80% proximal stenosis.  There is a second  obtuse marginal branch as well as a left  posterolateral branch without any  significant disease.   Left ventriculogram performed in the 30 degree RAO projection demonstrated  an akinetic midinferior wall and was otherwise normal.  The estimated left  ventricular ejection fraction is 45%, and there is no mitral regurgitation  present.   In the setting of the patient's acute MI, I proceeded to perform  percutaneous intervention on the acutely occluded right coronary artery,  which is his culprit vessel.  The vessel was initially wired with a Cougar  guidewire.  Following this, a 2.5 x 15 mm balloon was inflated on subsequent  inflations to 4 atmospheres.  After balloon dilatation, angiograms were  taken that demonstrated a patent vessel with the wire in a large RV marginal  branch.  I attempted to wire the true right coronary artery with the Cougar  wire but was unable to do so.  I left the Cougar wire in the RV marginal  branch and inserted a BMW wire into the true right coronary artery.  A 2.75  x 28 mm Multilink Vision stent was placed in the vessel and was inflated to  12 atmospheres.  Following intracoronary nitroglycerin, multiple  angiographic views were taken that showed a patent vessel with TIMI-3 flow  and 0 residual stenosis.  At the conclusion of the case a 6 [place] Star  Close vascular closure device was deployed for femoral arteriotomy closure.   Anticoagulation used for the procedure was intravenous heparin and  Integrilin.  The ACT during the case ranged from approximately 230 to 265.  The patient was given 600 mg of Plavix after the completion of the  intervention.   ASSESSMENT:  1. Acute inferior MI secondary to an acutely occluded right coronary      artery.  2. High-grade left anterior descending and left circumflex disease.  3. Mild left ventricular dysfunction.   The patient will be monitored in the coronary are unit.  He will continue on 18 hours of Integrilin.  Will aggressively fluid-resuscitate him  in the  setting of his right ventricular infarct.  We will need to consider how to  electively treat his high-grade disease of his left coronary system.  He  will either require multivessel percutaneous intervention versus a surgical  approach.      Veverly Fells. Excell Seltzer, MD  Electronically Signed     MDC/MEDQ  D:  03/22/2006  T:  03/23/2006  Job:  [id number]

## 2010-12-17 ENCOUNTER — Other Ambulatory Visit: Payer: Self-pay | Admitting: *Deleted

## 2010-12-17 MED ORDER — AMLODIPINE BESYLATE 5 MG PO TABS: 5.0000 mg | ORAL_TABLET | Freq: Every day | ORAL | Status: DC

## 2010-12-20 ENCOUNTER — Ambulatory Visit (INDEPENDENT_AMBULATORY_CARE_PROVIDER_SITE_OTHER): Payer: BC Managed Care – PPO | Admitting: Internal Medicine

## 2010-12-20 DIAGNOSIS — B182 Chronic viral hepatitis C: Secondary | ICD-10-CM

## 2010-12-20 LAB — BASIC METABOLIC PANEL
Glucose: 90 mg/dL
Potassium: 4 mmol/L (ref 3.4–5.3)

## 2010-12-30 ENCOUNTER — Encounter: Payer: Self-pay | Admitting: Cardiology

## 2011-01-07 ENCOUNTER — Ambulatory Visit (INDEPENDENT_AMBULATORY_CARE_PROVIDER_SITE_OTHER): Payer: BC Managed Care – PPO | Admitting: Internal Medicine

## 2011-01-07 DIAGNOSIS — B182 Chronic viral hepatitis C: Secondary | ICD-10-CM

## 2011-01-13 ENCOUNTER — Other Ambulatory Visit: Payer: Self-pay | Admitting: *Deleted

## 2011-01-13 MED ORDER — LISINOPRIL 10 MG PO TABS: 10.0000 mg | ORAL_TABLET | Freq: Every day | ORAL | Status: DC

## 2011-01-13 MED ORDER — CLOPIDOGREL BISULFATE 75 MG PO TABS: 75.0000 mg | ORAL_TABLET | Freq: Every day | ORAL | Status: DC

## 2011-01-24 ENCOUNTER — Telehealth: Payer: Self-pay | Admitting: Cardiology

## 2011-01-24 MED ORDER — CLONAZEPAM 0.5 MG PO TABS: 0.2500 mg | ORAL_TABLET | Freq: Two times a day (BID) | ORAL | Status: DC | PRN

## 2011-01-24 NOTE — Telephone Encounter (Signed)
.   Requested Prescriptions   Pending Prescriptions Disp Refills  . clonazePAM (KLONOPIN) 0.5 MG tablet 30 tablet 3    Sig: Take 0.5 tablets (0.25 mg total) by mouth 2 (two) times daily as needed for anxiety. Take 1/2 by mouth twice daily     

## 2011-01-27 ENCOUNTER — Telehealth: Payer: Self-pay | Admitting: Cardiology

## 2011-01-27 MED ORDER — CLONAZEPAM 0.5 MG PO TABS: 0.2500 mg | ORAL_TABLET | Freq: Two times a day (BID) | ORAL | Status: DC | PRN

## 2011-01-27 NOTE — Telephone Encounter (Signed)
Requested Prescriptions   Signed Prescriptions Disp Refills  . clonazePAM (KLONOPIN) 0.5 MG tablet 30 tablet 3    Sig: Take 0.5 tablets (0.25 mg total) by mouth 2 (two) times daily as needed for anxiety. Take 1/2 by mouth twice daily    Authorizing Provider: DE GENT, GUY    Ordering User: Lacie Scotts

## 2011-01-28 ENCOUNTER — Other Ambulatory Visit: Payer: Self-pay | Admitting: Cardiology

## 2011-01-28 ENCOUNTER — Telehealth: Payer: Self-pay | Admitting: Cardiology

## 2011-01-28 MED ORDER — CLONAZEPAM 0.5 MG PO TABS: 0.2500 mg | ORAL_TABLET | Freq: Two times a day (BID) | ORAL | Status: DC | PRN

## 2011-01-28 NOTE — Telephone Encounter (Signed)
.   Requested Prescriptions   Pending Prescriptions Disp Refills  . clonazePAM (KLONOPIN) 0.5 MG tablet 30 tablet 3    Sig: Take 0.5 tablets (0.25 mg total) by mouth 2 (two) times daily as needed for anxiety. Take 1/2 by mouth twice daily

## 2011-02-02 ENCOUNTER — Encounter: Payer: Self-pay | Admitting: *Deleted

## 2011-02-02 NOTE — Telephone Encounter (Signed)
This encounter was created in error - please disregard.

## 2011-02-07 ENCOUNTER — Telehealth (INDEPENDENT_AMBULATORY_CARE_PROVIDER_SITE_OTHER): Payer: Self-pay | Admitting: *Deleted

## 2011-02-07 DIAGNOSIS — B1920 Unspecified viral hepatitis C without hepatic coma: Secondary | ICD-10-CM

## 2011-02-07 NOTE — Telephone Encounter (Signed)
Lab orders printed and faxed to [facility] as it is time for the patient to have lab work drawn. Patient's wife called and made aware.

## 2011-02-11 ENCOUNTER — Telehealth: Payer: Self-pay

## 2011-02-11 ENCOUNTER — Encounter (INDEPENDENT_AMBULATORY_CARE_PROVIDER_SITE_OTHER): Payer: Self-pay | Admitting: *Deleted

## 2011-02-11 LAB — CBC AND DIFFERENTIAL: Platelets: 128 10*3/uL — AB (ref 150–399)

## 2011-02-11 MED ORDER — CLONAZEPAM 0.5 MG PO TABS: 0.2500 mg | ORAL_TABLET | Freq: Two times a day (BID) | ORAL | Status: DC | PRN

## 2011-02-14 NOTE — Telephone Encounter (Signed)
This encounter was created in error - please disregard.

## 2011-02-21 ENCOUNTER — Ambulatory Visit (INDEPENDENT_AMBULATORY_CARE_PROVIDER_SITE_OTHER): Payer: BC Managed Care – PPO | Admitting: Internal Medicine

## 2011-02-25 ENCOUNTER — Encounter (INDEPENDENT_AMBULATORY_CARE_PROVIDER_SITE_OTHER): Payer: Self-pay

## 2011-02-28 ENCOUNTER — Encounter (INDEPENDENT_AMBULATORY_CARE_PROVIDER_SITE_OTHER): Payer: Self-pay | Admitting: Internal Medicine

## 2011-02-28 ENCOUNTER — Ambulatory Visit (INDEPENDENT_AMBULATORY_CARE_PROVIDER_SITE_OTHER): Payer: BC Managed Care – PPO | Admitting: Internal Medicine

## 2011-02-28 DIAGNOSIS — B182 Chronic viral hepatitis C: Secondary | ICD-10-CM

## 2011-02-28 DIAGNOSIS — D509 Iron deficiency anemia, unspecified: Secondary | ICD-10-CM

## 2011-02-28 NOTE — Patient Instructions (Addendum)
Next blood work on 3rd or 4th sept,2012.

## 2011-02-28 NOTE — Progress Notes (Signed)
Presenting complaint; followup for hepatitis C. Patient is undergoing therapy.  Subjective;  This 55 year old Caucasian male who has chronic hepatitis C genotype 1A,  grade 2 inflammation and stage II fibrosis; he  was begun on triple therapy on July 9,2012.  Treatment has been complicated by anemia with hemoglobin dropping from  14 g less than 8 g. He was given 2 units of PRBCs at [facility] last week and he was begun on Epogen. He says he tell a big difference as soon as received PRBC.  He is going to work daily but he's been given permission to leave early if he needs to.  He says his boss initially didn't believe in but now has been very supportive as has been the other team members.  he feels as if he has 4 all the time.[name]  His appetite is very good. He has lost 7 pounds since his therapy was begun but he states his weight loss has leveled off.  He denies abdominal pain fever nausea vomiting melena rectal bleeding or diarrhea.  current medications; Current Outpatient Prescriptions on File Prior to Visit  Medication Sig Dispense Refill  . amLODipine (NORVASC) 5 MG tablet Take 1 tablet (5 mg total) by mouth daily.  30 tablet  6  . aspirin 81 MG tablet Take 81 mg by mouth daily.        . carvedilol (COREG) 6.25 MG tablet Take 6.25 mg by mouth 2 (two) times daily with a meal. Takes 1/2 Tablet Twice Daily      . citalopram (CELEXA) 20 MG tablet Take 20 mg by mouth daily.        . clonazePAM (KLONOPIN) 0.5 MG tablet Take 0.5 tablets (0.25 mg total) by mouth 2 (two) times daily as needed for anxiety. Take 1/2 by mouth twice daily   30 tablet  3  . clopidogrel (PLAVIX) 75 MG tablet Take 1 tablet (75 mg total) by mouth daily. PATIENT NEED OFFICE VISIT  30 tablet  1  . HYDROcodone-acetaminophen (VICODIN) 5-500 MG per tablet Take 1 tablet by mouth as needed.       [name] lisinopril (PRINIVIL,ZESTRIL) 10 MG tablet Take 1 tablet (10 mg total) by mouth daily. PATIENT NEED OFFICE VISIT  30 tablet  1  . nitroGLYCERIN  (NITROSTAT) 0.4 MG SL tablet Place 0.4 mg under the tongue every 5 (five) minutes as needed.        [name] omeprazole-sodium bicarbonate (ZEGERID) 40-1100 MG per capsule Take 1 capsule by mouth at bedtime.       . traZODone (DESYREL) 50 MG tablet Take 50 mg by mouth at bedtime.         Objective; BP 102/68  Pulse 72  Temp(Src) 98 F (36.7 C) (Oral)  Ht 5\' 7"  (1.702 m)  Wt 167 lb (75.751 kg)  BMI 26.16 kg/m2  conjunctiva is pink.  There is nonicteric oropharyngeal mucosa is normal. No neck masses or thyromegaly noted. Lungs are clear to auscultation. Cardiac exam with regular rhythm normal S1 and S2.  His abdomen is flat soft and nontender with organomegaly or masses.   no peripheral edema or clubbing noted.  lab data  HCV RNA from 02/11/2011 and was undetectable of detection for this as it is 7.1 international units per male for HCV genotype 1.  assessment;   Roy [name] has completed his 6 weeks of antiviral therapy.  he has since 6 weeks of  Telapravir  After that she would be on Pegasys and ribavirin for 12 weeks.  He is currently on ribavirin 800 mg per day  We will continue with  epogen for now.  recommendations  continue current therapy.  He'll have repeat lab studies on the 2012  To include CBC HCV RNA quantitative.  He would return for office visit on 04/19/2011.

## 2011-03-08 ENCOUNTER — Telehealth (INDEPENDENT_AMBULATORY_CARE_PROVIDER_SITE_OTHER): Payer: Self-pay | Admitting: *Deleted

## 2011-03-08 DIAGNOSIS — B1920 Unspecified viral hepatitis C without hepatic coma: Secondary | ICD-10-CM

## 2011-03-08 DIAGNOSIS — D649 Anemia, unspecified: Secondary | ICD-10-CM

## 2011-03-08 NOTE — Telephone Encounter (Signed)
Patient's labs are due

## 2011-03-14 ENCOUNTER — Telehealth (INDEPENDENT_AMBULATORY_CARE_PROVIDER_SITE_OTHER): Payer: Self-pay | Admitting: *Deleted

## 2011-03-14 NOTE — Telephone Encounter (Signed)
Kathie Rhodes called and states that over the weekend that Roy [name]'s blood pressure dropped to 90/50. She held his blood pressure medications over the weekend monitoring his B/P. Readings were 120/70 , 105/60. She wants to stop his blood pressure medication. I advised her to follow-up with doctor that handles his blood pressure. She also ask about lab work , patient 's CBC/D . His HGB is 8.3, and HCT is 24.5. Kathie Rhodes ask if he would need another transfusion and states that his Epogen is due 03-24-11. I spoke with Delrae Rend and she states that the patient should have T&C for 2 units of blood. I am going to contact Dr. Karilyn Cota for his recommendations. At 2:38 pm I sent a text message to Dr. Karilyn Cota.

## 2011-03-15 ENCOUNTER — Telehealth (INDEPENDENT_AMBULATORY_CARE_PROVIDER_SITE_OTHER): Payer: Self-pay | Admitting: *Deleted

## 2011-03-15 DIAGNOSIS — D649 Anemia, unspecified: Secondary | ICD-10-CM

## 2011-03-15 DIAGNOSIS — B1920 Unspecified viral hepatitis C without hepatic coma: Secondary | ICD-10-CM

## 2011-03-15 NOTE — Telephone Encounter (Signed)
This is a response from Dr. Karilyn Cota on 03-14-11 regarding the previous telephone encounter, Per Dr. Karilyn Cota the patient should take 1/2 of the Norvasc one day then the next day 1/2 of the Linsinopril then alternate them. ,Take his Pegasys injection tonight (03-14-11).  his Ribavirin should be 2 Tablets in the morning and 2 in the evening. T&C for 2 units of PRBC,have a post H/H drawn , then repeat the lab in 2 weeks. I called Kathie Rhodes and shared the info with her, also faxed the Transfusion orders to [facility], [facility], [facility]. The plan is that Roy [name] will have T&C today and will be transfused 03-16-11.

## 2011-03-15 NOTE — Telephone Encounter (Signed)
This lab is 2 weeks post blood transfusion per Dr. Karilyn Cota.

## 2011-03-15 NOTE — Telephone Encounter (Signed)
This is a order that is needed as the previous H/H is not within the required time needed prior to Blood Transfusion.

## 2011-03-17 ENCOUNTER — Other Ambulatory Visit (INDEPENDENT_AMBULATORY_CARE_PROVIDER_SITE_OTHER): Payer: Self-pay | Admitting: Internal Medicine

## 2011-03-17 DIAGNOSIS — Z8719 Personal history of other diseases of the digestive system: Secondary | ICD-10-CM

## 2011-04-01 ENCOUNTER — Telehealth (INDEPENDENT_AMBULATORY_CARE_PROVIDER_SITE_OTHER): Payer: Self-pay | Admitting: *Deleted

## 2011-04-01 DIAGNOSIS — B1920 Unspecified viral hepatitis C without hepatic coma: Secondary | ICD-10-CM

## 2011-04-01 DIAGNOSIS — D649 Anemia, unspecified: Secondary | ICD-10-CM

## 2011-04-01 NOTE — Telephone Encounter (Signed)
Labs are due in October prior to the patient's office visit

## 2011-04-15 ENCOUNTER — Other Ambulatory Visit: Payer: Self-pay | Admitting: *Deleted

## 2011-04-15 MED ORDER — CLOPIDOGREL BISULFATE 75 MG PO TABS: 75.0000 mg | ORAL_TABLET | Freq: Every day | ORAL | Status: DC

## 2011-04-15 MED ORDER — LISINOPRIL 10 MG PO TABS: 10.0000 mg | ORAL_TABLET | Freq: Every day | ORAL | Status: DC

## 2011-04-18 ENCOUNTER — Telehealth (INDEPENDENT_AMBULATORY_CARE_PROVIDER_SITE_OTHER): Payer: Self-pay | Admitting: *Deleted

## 2011-04-18 DIAGNOSIS — D649 Anemia, unspecified: Secondary | ICD-10-CM

## 2011-04-18 DIAGNOSIS — B1920 Unspecified viral hepatitis C without hepatic coma: Secondary | ICD-10-CM

## 2011-04-18 NOTE — Telephone Encounter (Signed)
Per Dr. Karilyn Cota , the patient will need a CBC prior to his OV on 04-19-11. Order faxed to [facility]

## 2011-04-19 ENCOUNTER — Ambulatory Visit (INDEPENDENT_AMBULATORY_CARE_PROVIDER_SITE_OTHER): Payer: BC Managed Care – PPO | Admitting: Internal Medicine

## 2011-04-19 ENCOUNTER — Encounter (INDEPENDENT_AMBULATORY_CARE_PROVIDER_SITE_OTHER): Payer: Self-pay | Admitting: Internal Medicine

## 2011-04-19 VITALS — BP 140/80 | HR 78 | Temp 98.2°F | Resp 16 | Ht 68.0 in | Wt 170.0 lb

## 2011-04-19 DIAGNOSIS — D649 Anemia, unspecified: Secondary | ICD-10-CM

## 2011-04-19 NOTE — Patient Instructions (Signed)
Next blood work or CBC would be in 2 weeks.

## 2011-04-22 NOTE — Progress Notes (Signed)
Presenting complaint; followup we hepatitis C. Patient getting antiviral therapy.  Subjective; patient is 55 year old Caucasian male who was chronic hepatitis see genotype 1 grade 2 and stage II disease who was begun on antiviral therapy on 01/10/2011. He finished 12 weeks off Telaprevir and now is on Pegasys and co-Pegasus. His treatment has been complicated by anemia for which she was given 2 units of PRBCs last month. He had his scheduled lab on 04/18/2011 his hemoglobin was down to 7.5 g. Therefore 2 units of PRBCs were ordered and he received them this morning. Post transfusion hemoglobin 10.2. He feels much better following transfusion. Last week he felt weak and tired but did not experience chest pain or shortness of breath. He has lost total of 7 pounds he has had few episodes of nausea and heaving but no vomiting. Heartburn remains well controlled with medications. He denies abdominal pain melena rectal bleeding or lower extremity edema. His wife states that he has 9 more injections left. Current Outpatient Prescriptions  Medication Sig Dispense Refill  . ALPRAZolam (XANAX) 0.5 MG tablet Take 0.5 mg by mouth. As Needed for Anxiety      . aspirin 81 MG tablet Take 81 mg by mouth daily.        . citalopram (CELEXA) 20 MG tablet Take 20 mg by mouth daily.        . clopidogrel (PLAVIX) 75 MG tablet Take 1 tablet (75 mg total) by mouth daily. PATIENT NEED OFFICE VISIT  30 tablet  1  . EPOGEN 20000 UNIT/ML injection Inject 20,000 Units into the skin. Every 2 weeks      . HYDROcodone-acetaminophen (VICODIN) 5-500 MG per tablet Take 1 tablet by mouth as needed.       . nitroGLYCERIN (NITROSTAT) 0.4 MG SL tablet Place 0.4 mg under the tongue every 5 (five) minutes as needed.        [name] omeprazole-sodium bicarbonate (ZEGERID) 40-1100 MG per capsule TAKE ONE CAPSULE BY MOUTH AT BEDTIME  30 capsule  6  . PEGASYS 180 MCG/0.5ML SOLN Inject 135 mcg into the skin once a week.       . ribavirin (COPEGUS) 200 MG  tablet Take 200 mg/day by mouth. Takes 2 Tablets in the Morning , and 2 tablets in the Evening      . amLODipine (NORVASC) 5 MG tablet Take 1 tablet (5 mg total) by mouth daily.  30 tablet  6  . carvedilol (COREG) 6.25 MG tablet Take 6.25 mg by mouth 2 (two) times daily with a meal. Takes 1/2 Tablet Twice Daily      . lisinopril (PRINIVIL,ZESTRIL) 10 MG tablet Take 1 tablet (10 mg total) by mouth daily. PATIENT NEED OFFICE VISIT  30 tablet  1   Objective BP 140/80  Pulse 78  Temp(Src) 98.2 F (36.8 C) (Oral)  Resp 16  Ht 5\' 8"  (1.727 m)  Wt 170 lb (77.111 kg)  BMI 25.85 kg/m2 Conjunctiva is pink. Sclerae nonicteric. Oropharyngeal mucosa is normal. No neck masses or thyromegaly noted. Lungs are clear to auscultation. Abdomen is soft and nontender without organomegaly or masses. No LE edema noted. Assessment. Antiviral therapy for hepatitis C he has been complicated by anemia and all together he has received 4 units of PRBCs. He has not experienced any other side effects. Since he cleared colitis at week 4 he would be eligible for 24 weeks off therapy or RGT. Plan. Would continue current dose of this is which is 135 mcg and ribavirin of 400  mg twice a day. CBC in 2 weeks. will have CBC, comprehensive chemistry panel, TSH and HCV RNA at [facility]. Next visit would be in 10 weeks.

## 2011-06-14 ENCOUNTER — Ambulatory Visit (INDEPENDENT_AMBULATORY_CARE_PROVIDER_SITE_OTHER): Payer: BC Managed Care – PPO | Admitting: Internal Medicine

## 2011-06-14 ENCOUNTER — Other Ambulatory Visit: Payer: Self-pay | Admitting: *Deleted

## 2011-06-14 ENCOUNTER — Encounter (INDEPENDENT_AMBULATORY_CARE_PROVIDER_SITE_OTHER): Payer: Self-pay | Admitting: Internal Medicine

## 2011-06-14 VITALS — BP 140/90 | HR 78 | Temp 98.3°F | Resp 14 | Ht 68.0 in | Wt 171.0 lb

## 2011-06-14 DIAGNOSIS — F32A Depression, unspecified: Secondary | ICD-10-CM

## 2011-06-14 DIAGNOSIS — R1013 Epigastric pain: Secondary | ICD-10-CM

## 2011-06-14 DIAGNOSIS — F329 Major depressive disorder, single episode, unspecified: Secondary | ICD-10-CM

## 2011-06-14 DIAGNOSIS — B1920 Unspecified viral hepatitis C without hepatic coma: Secondary | ICD-10-CM

## 2011-06-14 LAB — CBC WITH DIFFERENTIAL/PLATELET
Basophils Absolute: 0 10*3/uL (ref 0.0–0.1)
Basophils Relative: 0 % (ref 0–1)
Eosinophils Absolute: 0 10*3/uL (ref 0.0–0.7)
Eosinophils Relative: 1 % (ref 0–5)
MCH: 32.9 pg (ref 26.0–34.0)
MCV: 103.3 fL — ABNORMAL HIGH (ref 78.0–100.0)
Neutrophils Relative %: 43 % (ref 43–77)
Platelets: 134 10*3/uL — ABNORMAL LOW (ref 150–400)
RBC: 3.01 MIL/uL — ABNORMAL LOW (ref 4.22–5.81)
RDW: 16.6 % — ABNORMAL HIGH (ref 11.5–15.5)

## 2011-06-14 LAB — HEPATIC FUNCTION PANEL
Albumin: 4 g/dL (ref 3.5–5.2)
Alkaline Phosphatase: 67 U/L (ref 39–117)
Total Bilirubin: 0.5 mg/dL (ref 0.3–1.2)

## 2011-06-14 LAB — TSH: TSH: 2.849 u[IU]/mL (ref 0.350–4.500)

## 2011-06-14 MED ORDER — CLOPIDOGREL BISULFATE 75 MG PO TABS: 75.0000 mg | ORAL_TABLET | Freq: Every day | ORAL | Status: DC

## 2011-06-14 MED ORDER — CITALOPRAM HYDROBROMIDE 40 MG PO TABS: 40.0000 mg | ORAL_TABLET | Freq: Every day | ORAL | Status: DC

## 2011-06-14 NOTE — Patient Instructions (Signed)
Please take Zegrid 40 mg by mouth daily at bedtime. Escitalopram increased to 40 mg by mouth daily Physician will contact you with results of blood test.

## 2011-06-16 NOTE — Progress Notes (Signed)
Presenting complaint; Fatigue and weakness; patient states he feels depressed. Subjective: Roy [name] is 55 year old Caucasian male who is here for scheduled visit accompanied by his wife. He has genotype 1A chronic hepatitis see and was begun on antiviral therapy on 01/10/2011. History given was complicated by anemia requiring 2 units of PRBCs while he was in triple therapy. Since he finished to lateral where his hemoglobin has been stable. He would be finished 24 weeks of therapy next week. He is on short-term disability. He now presents with multiple complaints. He has no energy he feels tired. According to his wife Roy [name] he spending a lot of time in bed. He denies being suicidal or wanting to hurt other people. His appetite is fair. He he does complain of epigastric pain when he wakes up in the morning with nausea and heaving and he generally brings out phlegm. He denies melena or rectal bleeding. His wife states he does not take his PPI daily Current Medications: Current Outpatient Prescriptions on File Prior to Visit  Medication Sig Dispense Refill  . ALPRAZolam (XANAX) 0.5 MG tablet Take 0.5 mg by mouth. As Needed for Anxiety      . aspirin 81 MG tablet Take 81 mg by mouth daily.        [name] HYDROcodone-acetaminophen (VICODIN) 5-500 MG per tablet Take 1 tablet by mouth as needed.       . nitroGLYCERIN (NITROSTAT) 0.4 MG SL tablet Place 0.4 mg under the tongue every 5 (five) minutes as needed.        [name] omeprazole-sodium bicarbonate (ZEGERID) 40-1100 MG per capsule TAKE ONE CAPSULE BY MOUTH AT BEDTIME  30 capsule  6  . PEGASYS 180 MCG/0.5ML SOLN Inject 135 mcg into the skin once a week.       . ribavirin (COPEGUS) 200 MG tablet Take 200 mg/day by mouth. Takes 2 Tablets in the Morning , and 2 tablets in the Evening      . amLODipine (NORVASC) 5 MG tablet Take 1 tablet (5 mg total) by mouth daily.  30 tablet  6  . carvedilol (COREG) 6.25 MG tablet Take 6.25 mg by mouth 2 (two) times daily with a meal.  Takes 1/2 Tablet Twice Daily      . lisinopril (PRINIVIL,ZESTRIL) 10 MG tablet Take 1 tablet (10 mg total) by mouth daily. PATIENT NEED OFFICE VISIT  30 tablet  1   Objective: BP 140/90  Pulse 78  Temp(Src) 98.3 F (36.8 C) (Oral)  Resp 14  Ht 5\' 8"  (1.727 m)  Wt 171 lb (77.565 kg)  BMI 26.00 kg/m2 Patient is alert and does not appear to be in any distress. Conjunctiva is pink. Sclera is nonicteric Oral pharyngeal mucosa is normal. No neck masses or thyromegaly noted. Cardiac exam with regular rhythm normal S1 and S2. No murmur or gallop noted. Lungs are clear to auscultation. Abdomen is soft and nontender without organomegaly or masses. No LE edema or clubbing noted.  Assessment: Patient has developed relapse of his depression secondary to Pegasys. He will get 24th dose next week. In the meantime we could increase the dose of Celexa. Need to rule out anemia hypokalemia or hypothyroidism. His had multiple adenomas removed from his colon in 2 previous colonoscopies. Need to get records from [facility] and determine when his next exam should be.  Plan: Increase citalopram to 40 mg by mouth daily. He will go to the lab for CBC, LFTs and TSH. Patient advised to take Zegerid every night. He will have next  blood work at [facility]. Office visit in 4 months

## 2011-06-20 ENCOUNTER — Ambulatory Visit (INDEPENDENT_AMBULATORY_CARE_PROVIDER_SITE_OTHER): Payer: BC Managed Care – PPO | Admitting: Internal Medicine

## 2011-06-30 ENCOUNTER — Telehealth (INDEPENDENT_AMBULATORY_CARE_PROVIDER_SITE_OTHER): Payer: Self-pay | Admitting: *Deleted

## 2011-06-30 DIAGNOSIS — B1920 Unspecified viral hepatitis C without hepatic coma: Secondary | ICD-10-CM

## 2011-06-30 NOTE — Telephone Encounter (Signed)
These are the end of treatment labs-  24 weeks

## 2011-07-15 ENCOUNTER — Other Ambulatory Visit: Payer: Self-pay | Admitting: *Deleted

## 2011-07-15 MED ORDER — CLOPIDOGREL BISULFATE 75 MG PO TABS: 75.0000 mg | ORAL_TABLET | Freq: Every day | ORAL | Status: DC

## 2011-07-21 ENCOUNTER — Telehealth (INDEPENDENT_AMBULATORY_CARE_PROVIDER_SITE_OTHER): Payer: Self-pay | Admitting: *Deleted

## 2011-07-21 DIAGNOSIS — B1920 Unspecified viral hepatitis C without hepatic coma: Secondary | ICD-10-CM

## 2011-07-21 NOTE — Telephone Encounter (Signed)
Per Dr. Karilyn Cota the patient will need to repeat the Hep C Quant in 4 weeks (08-09-11) . The patient wants to go to [facility].

## 2011-08-10 ENCOUNTER — Telehealth (INDEPENDENT_AMBULATORY_CARE_PROVIDER_SITE_OTHER): Payer: Self-pay | Admitting: *Deleted

## 2011-08-10 ENCOUNTER — Encounter (INDEPENDENT_AMBULATORY_CARE_PROVIDER_SITE_OTHER): Payer: Self-pay | Admitting: *Deleted

## 2011-08-10 NOTE — Telephone Encounter (Signed)
Lab faxed to [facility] 

## 2011-08-20 ENCOUNTER — Telehealth (INDEPENDENT_AMBULATORY_CARE_PROVIDER_SITE_OTHER): Payer: Self-pay | Admitting: Internal Medicine

## 2011-08-20 NOTE — Telephone Encounter (Signed)
Result of HCV RNA noted; I had to confirm with Arlys John at [facility] for a complete report.; Test is negative. Patient completed antiviral therapy on 06/27/2011. Results given to patient's wife. Will see him in the office in May and plan to repeat it one more time.

## 2011-08-31 ENCOUNTER — Encounter: Payer: Self-pay | Admitting: Cardiology

## 2011-08-31 ENCOUNTER — Ambulatory Visit (INDEPENDENT_AMBULATORY_CARE_PROVIDER_SITE_OTHER): Payer: BC Managed Care – PPO | Admitting: Cardiology

## 2011-08-31 ENCOUNTER — Encounter: Payer: Self-pay | Admitting: *Deleted

## 2011-08-31 VITALS — BP 162/90 | HR 18 | Resp 20 | Ht 69.0 in | Wt 170.2 lb

## 2011-08-31 DIAGNOSIS — F341 Dysthymic disorder: Secondary | ICD-10-CM

## 2011-08-31 DIAGNOSIS — I10 Essential (primary) hypertension: Secondary | ICD-10-CM

## 2011-08-31 DIAGNOSIS — I498 Other specified cardiac arrhythmias: Secondary | ICD-10-CM

## 2011-08-31 DIAGNOSIS — E785 Hyperlipidemia, unspecified: Secondary | ICD-10-CM

## 2011-08-31 DIAGNOSIS — K759 Inflammatory liver disease, unspecified: Secondary | ICD-10-CM

## 2011-08-31 DIAGNOSIS — I2510 Atherosclerotic heart disease of native coronary artery without angina pectoris: Secondary | ICD-10-CM

## 2011-08-31 DIAGNOSIS — Z79899 Other long term (current) drug therapy: Secondary | ICD-10-CM

## 2011-08-31 MED ORDER — PRAVASTATIN SODIUM 10 MG PO TABS: 10.0000 mg | ORAL_TABLET | Freq: Every day | ORAL | Status: DC

## 2011-08-31 MED ORDER — ISOSORBIDE MONONITRATE ER 30 MG PO TB24: 30.0000 mg | ORAL_TABLET | Freq: Every day | ORAL | Status: DC

## 2011-08-31 NOTE — Patient Instructions (Signed)
Your physician recommends that you schedule a follow-up appointment in: 6 months. You will receive a reminder letter in the mail about 1-2 months in advance, reminding you to call and schedule your appointment. If you don't receive this letter, please contact our office.  Your physician has recommended you make the following change in your medication:  RESTART YOUR AMLODIPINE 5 MG DAILY STOP CARVEDILOL STOP LISINOPRIL START ISOSORBIDE MONONITRATE 30 MG DAILY START PRAVASTATIN 10 MG DAILY Your new prescriptions have been sent to your pharmacy.  Your physician recommends that you return for a FASTING lipid/liver profile around April 3 rd 2013 at the [facility].

## 2011-09-05 ENCOUNTER — Encounter (INDEPENDENT_AMBULATORY_CARE_PROVIDER_SITE_OTHER): Payer: Self-pay | Admitting: Internal Medicine

## 2011-09-05 ENCOUNTER — Ambulatory Visit (INDEPENDENT_AMBULATORY_CARE_PROVIDER_SITE_OTHER): Payer: BC Managed Care – PPO | Admitting: Internal Medicine

## 2011-09-05 VITALS — BP 124/80 | HR 56 | Temp 97.9°F | Ht 69.0 in | Wt 170.6 lb

## 2011-09-05 DIAGNOSIS — B1920 Unspecified viral hepatitis C without hepatic coma: Secondary | ICD-10-CM

## 2011-09-05 NOTE — Patient Instructions (Signed)
F/u 6 months

## 2011-09-05 NOTE — Progress Notes (Signed)
Subjective:     Patient ID: Roy [name], male   DOB: 1956/06/11, 56 y.o.   MRN: [id number]  HPI Roy [name] is a 56 yr old male presenting today for f/u of Hepatitis C.  He started treatment in July of 2012.  He has finished his [place]. His last HCV Quant PCR 08/20/2011 was none detected. He tells me today, he feels good. He occasionally has lower abdominal pain.  He says his depression is much better. He says he is coming out of his shadow.  He is not suicidal.  Appetite is good. No weight loss.  He usually has a BM about once a day.  He feels for the most part very good.  Review of Systems see hpi Current Outpatient Prescriptions  Medication Sig Dispense Refill  . ALPRAZolam (XANAX) 0.5 MG tablet Take 0.5 mg by mouth. As Needed for Anxiety      . amLODipine (NORVASC) 5 MG tablet Take 1 tablet (5 mg total) by mouth daily.  30 tablet  6  . aspirin 81 MG tablet Take 81 mg by mouth daily.        . citalopram (CELEXA) 40 MG tablet Take 1 tablet (40 mg total) by mouth daily.  30 tablet  2  . clopidogrel (PLAVIX) 75 MG tablet Take 1 tablet (75 mg total) by mouth daily. PATIENT NEED OFFICE VISIT  15 tablet  0  . HYDROcodone-acetaminophen (VICODIN) 5-500 MG per tablet Take 1 tablet by mouth as needed.       . isosorbide mononitrate (IMDUR) 30 MG 24 hr tablet Take 1 tablet (30 mg total) by mouth daily.  30 tablet  6  . nitroGLYCERIN (NITROSTAT) 0.4 MG SL tablet Place 0.4 mg under the tongue every 5 (five) minutes as needed.        [name] omeprazole-sodium bicarbonate (ZEGERID) 40-1100 MG per capsule TAKE ONE CAPSULE BY MOUTH AT BEDTIME  30 capsule  6  . pravastatin (PRAVACHOL) 10 MG tablet Take 1 tablet (10 mg total) by mouth daily.  30 tablet  6   Past Surgical History  Procedure Date  . Liver biopsy 03/24/2010   History   Social History  . Marital Status: Married    Spouse Name: N/A    Number of Children: N/A  . Years of Education: N/A   Occupational History  . Not on file.   Social History  Main Topics  . Smoking status: Former Smoker    Types: Cigarettes    Quit date: 02/28/2008  . Smokeless tobacco: Never Used  . Alcohol Use: No  . Drug Use: No  . Sexually Active: Not on file   Other Topics Concern  . Not on file   Social History Narrative  . No narrative on file   Family Status  Relation Status Death Age  . Mother Alive     heart problems  . Father Deceased 52    Liver Cancer  . Sister Deceased 56    breathing problems  . Brother Alive   . Brother Alive     back problems  . Brother Alive   . Brother Alive   . Son Alive   . Son Alive    Allergies  Allergen Reactions  . Codeine   . Food     PEACHES  . Penicillins        Objective:   Physical Exam Filed Vitals:   09/05/11 1059  Height: 5\' 9"  (1.753 m)  Weight: 170 lb 9.6 oz (77.384 kg)  Alert and oriented. Skin warm and dry. Oral mucosa is moist.   . Sclera anicteric, conjunctivae is pink. Thyroid not enlarged. No cervical lymphadenopathy. Lungs clear. Heart regular rate and rhythm.  Abdomen is soft. Bowel sounds are positive. No hepatomegaly. No abdominal masses felt. No tenderness.  No edema to lower extremities. Patient is alert and oriented.      Assessment:    Hepatitis C with negative HCV RNA    Plan:    He will follow up in 6 months.

## 2011-09-14 ENCOUNTER — Other Ambulatory Visit (INDEPENDENT_AMBULATORY_CARE_PROVIDER_SITE_OTHER): Payer: Self-pay | Admitting: Internal Medicine

## 2011-09-14 ENCOUNTER — Other Ambulatory Visit: Payer: Self-pay | Admitting: *Deleted

## 2011-09-14 MED ORDER — CLOPIDOGREL BISULFATE 75 MG PO TABS: 75.0000 mg | ORAL_TABLET | Freq: Every day | ORAL | Status: DC

## 2011-09-15 NOTE — Telephone Encounter (Signed)
Dose reduced to 20 mg qd. Talked with patient,s wife.

## 2011-09-29 ENCOUNTER — Ambulatory Visit: Payer: BC Managed Care – PPO | Admitting: Cardiology

## 2011-10-03 ENCOUNTER — Encounter: Payer: Self-pay | Admitting: Cardiology

## 2011-10-03 DIAGNOSIS — K759 Inflammatory liver disease, unspecified: Secondary | ICD-10-CM | POA: Insufficient documentation

## 2011-10-03 NOTE — Assessment & Plan Note (Signed)
Statin restarted

## 2011-10-03 NOTE — Assessment & Plan Note (Signed)
Blood pressure well after treatment for hepatitis but now improved and we can restart medications including amlodipine 5 mg a day and Imdur 30 mg a day.

## 2011-10-03 NOTE — Assessment & Plan Note (Addendum)
Status post prior coronary intervention in 2007 2008. Patient does report occasional sharp chest pain that goes away fairly quickly. The pain is rather atypical. On a few occasions he has taken nitroglycerin. He currently does not smoke anymore. I also restarted pravastatin.

## 2011-10-03 NOTE — Assessment & Plan Note (Addendum)
No symptomatic bradycardia. Heart rate stable.

## 2011-10-03 NOTE — Progress Notes (Signed)
Peyton Bottoms, MD, [facility] Board Certified in Adult Cardiovascular Medicine,Internal Medicine and Critical Care Medicine    CC: Followup patient with coronary artery disease.  HPI:  The patient is a 56 year old male with history of coronary artery disease. He has recently been treated for hepatitis C. The history is provided by letter by his wife. "He stopped trazodone when he started hepatitis treatment because he was contraindicated. While in hepatitis treatment his blood pressure was consistently low at about 90/60 and lower at times. He stop amlodipine Coreg and lisinopril. His blood pressure has been much more normal since ending his hepatitis therapy on 1224 he continues to test negative for hepatitis. The patient also had laser surgery on his right eye." His wife questions per this note whether he should be restarted on his cholesterol medications that his liver is not compromised anymore. From a cardiac perspective the patient is doing well. He reports no typical angina he did use on a few occasions sublingual nitroglycerin his pain is rather atypical. His blood pressure is much improved. He denies any orthopnea PND palpitations or syncope.  PMH: reviewed and listed in Problem List in Electronic Records (and see below) Past Medical History  Diagnosis Date  . Hepatitis C    Hepatitis 1C.  [name] CAD (coronary artery disease)     Percutaneous coronary intervention in 2007 2008  . Hyperlipidemia   . DDD (degenerative disc disease)   . GERD (gastroesophageal reflux disease)   . Reflux   . Abdominal pain, right upper quadrant   . Depression   . COPD (chronic obstructive pulmonary disease)    Past Surgical History  Procedure Date  . Liver biopsy 03/24/2010    Allergies/SH/FHX : available in Electronic Records for review  Allergies  Allergen Reactions  . Codeine   . Food     PEACHES  . Penicillins    History   Social History  . Marital Status: Married    Spouse Name: N/A     Number of Children: N/A  . Years of Education: N/A   Occupational History  . Not on file.   Social History Main Topics  . Smoking status: Former Smoker    Types: Cigarettes    Quit date: 02/28/2008  . Smokeless tobacco: Never Used  . Alcohol Use: No  . Drug Use: No  . Sexually Active: Not on file   Other Topics Concern  . Not on file   Social History Narrative  . No narrative on file   Family History  Problem Relation Age of Onset  . Diabetes Mother   . Diabetes Brother   . Heart disease Brother   . Diabetes Brother   . Healthy Brother   . Healthy Son   . Healthy Son     Medications: Current Outpatient Prescriptions  Medication Sig Dispense Refill  . ALPRAZolam (XANAX) 0.5 MG tablet Take 0.5 mg by mouth. As Needed for Anxiety      . amLODipine (NORVASC) 5 MG tablet Take 1 tablet (5 mg total) by mouth daily.  30 tablet  6  . aspirin 81 MG tablet Take 81 mg by mouth daily.        [name] HYDROcodone-acetaminophen (VICODIN) 5-500 MG per tablet Take 1 tablet by mouth as needed.       . isosorbide mononitrate (IMDUR) 30 MG 24 hr tablet Take 1 tablet (30 mg total) by mouth daily.  30 tablet  6  . nitroGLYCERIN (NITROSTAT) 0.4 MG SL tablet  Place 0.4 mg under the tongue every 5 (five) minutes as needed.        [name] omeprazole-sodium bicarbonate (ZEGERID) 40-1100 MG per capsule TAKE ONE CAPSULE BY MOUTH AT BEDTIME  30 capsule  6  . pravastatin (PRAVACHOL) 10 MG tablet Take 1 tablet (10 mg total) by mouth daily.  30 tablet  6  . citalopram (CELEXA) 40 MG tablet Take 0.5 tablets (20 mg total) by mouth daily.  30 tablet  5  . clopidogrel (PLAVIX) 75 MG tablet Take 1 tablet (75 mg total) by mouth daily.  30 tablet  6    ROS: No nausea or vomiting. No fever or chills.No melena or hematochezia.No bleeding.No claudication  Physical Exam: BP 162/90  Pulse 18  Resp 20  Ht 5\' 9"  (1.753 m)  Wt 170 lb 3.2 oz (77.202 kg)  BMI 25.13 kg/m2 General: Well-nourished white male in no  distress Neck: Normal carotid stroke and no carotid bruits. No thyromegaly nonnodular thyroid. JVP 5 cm Lungs: Clear breath sounds bilaterally. No wheezing Cardiac: Regular rate and rhythm with normal S1-S2 no murmur rubs or gallops Vascular: No edema. Skin: Warm and dry Physcologic: Depressed affect  12lead ECG: Sinus bradycardia heart rate 51 beats per minutes with left atrial enlargement. Limited bedside ECHO:N/A No images are attached to the encounter.   Assessment and Plan  Hepatitis Genotype 1A chronic hepatitis status post antiviral therapy 01/10/2011. History complicated by anemia requiring 2 units of packed red blood cells. Followed by gastroenterology  CAD, NATIVE VESSEL Status post prior coronary intervention in 2007 2008. Patient does report occasional sharp chest pain that goes away fairly quickly. The pain is rather atypical. On a few occasions he has taken nitroglycerin. He currently does not smoke anymore. I also restarted pravastatin.  HYPERTENSION, UNSPECIFIED Blood pressure well after treatment for hepatitis but now improved and we can restart medications including amlodipine 5 mg a day and Imdur 30 mg a day.  BRADYCARDIA No symptomatic bradycardia. Heart rate stable.  HYPERLIPIDEMIA-MIXED Statin restarted  ANXIETY DEPRESSION Worsened depression and citalopram has been increased to 40 mg by mouth daily.    Patient Active Problem List  Diagnoses  . HYPERLIPIDEMIA-MIXED  . ANXIETY DEPRESSION  . HYPERTENSION, UNSPECIFIED  . CAD, NATIVE VESSEL  . BRADYCARDIA  . Hepatitis

## 2011-10-03 NOTE — Assessment & Plan Note (Signed)
Worsened depression and citalopram has been increased to 40 mg by mouth daily.

## 2011-10-03 NOTE — Assessment & Plan Note (Signed)
Genotype 1A chronic hepatitis status post antiviral therapy 01/10/2011. History complicated by anemia requiring 2 units of packed red blood cells. Followed by gastroenterology

## 2011-10-26 ENCOUNTER — Encounter (INDEPENDENT_AMBULATORY_CARE_PROVIDER_SITE_OTHER): Payer: Self-pay

## 2011-11-02 ENCOUNTER — Telehealth: Payer: Self-pay | Admitting: *Deleted

## 2011-11-02 NOTE — Telephone Encounter (Signed)
Notes Recorded by Lesle Chris, LPN on 03/08/6212 at 4:04 PM Left message to return call.

## 2011-11-02 NOTE — Telephone Encounter (Signed)
Message copied by Murriel Hopper on Wed Nov 02, 2011  4:04 PM ------      Message from: Lewayne Bunting E      Created: Wed Nov 02, 2011  5:56 AM       LFts normal, LDL increased increase Pravachol 20mg  PO qdaily

## 2011-11-03 MED ORDER — PRAVASTATIN SODIUM 20 MG PO TABS: 20.0000 mg | ORAL_TABLET | Freq: Every evening | ORAL | Status: DC

## 2011-11-03 NOTE — Telephone Encounter (Signed)
Notes Recorded by Lesle Chris, LPN on 07/09/1094 at 11:09 AM Patient notified and verbalized understanding. Will send rx to [facility].

## 2011-11-14 ENCOUNTER — Encounter (INDEPENDENT_AMBULATORY_CARE_PROVIDER_SITE_OTHER): Payer: Self-pay | Admitting: Internal Medicine

## 2011-11-14 ENCOUNTER — Encounter (INDEPENDENT_AMBULATORY_CARE_PROVIDER_SITE_OTHER): Payer: BC Managed Care – PPO | Admitting: Internal Medicine

## 2011-11-14 ENCOUNTER — Ambulatory Visit (INDEPENDENT_AMBULATORY_CARE_PROVIDER_SITE_OTHER): Payer: BC Managed Care – PPO | Admitting: Internal Medicine

## 2011-11-14 VITALS — BP 128/66 | HR 64 | Temp 98.2°F | Ht 69.0 in | Wt 173.5 lb

## 2011-11-14 DIAGNOSIS — B1920 Unspecified viral hepatitis C without hepatic coma: Secondary | ICD-10-CM

## 2011-11-14 LAB — CBC
HCT: 42.2 % (ref 39.0–52.0)
MCH: 31.6 pg (ref 26.0–34.0)
MCHC: 33.2 g/dL (ref 30.0–36.0)
MCV: 95.3 fL (ref 78.0–100.0)
RDW: 14.3 % (ref 11.5–15.5)

## 2011-11-14 LAB — COMPREHENSIVE METABOLIC PANEL
AST: 16 U/L (ref 0–37)
Alkaline Phosphatase: 70 U/L (ref 39–117)
BUN: 16 mg/dL (ref 6–23)
Calcium: 9.5 mg/dL (ref 8.4–10.5)
Chloride: 105 mEq/L (ref 96–112)
Creat: 0.86 mg/dL (ref 0.50–1.35)
Total Bilirubin: 0.5 mg/dL (ref 0.3–1.2)

## 2011-11-14 NOTE — Patient Instructions (Addendum)
F/u in 6 months   Continue present medications. Cmet and CBC today.

## 2011-11-14 NOTE — Progress Notes (Signed)
Subjective:     Patient ID: Roy [name], male   DOB: August 13, 1955, 56 y.o.   MRN: [id number]  HPIDaniel is a 56 yr old male here today for f/u of his chronic Hepatitis C, genotype 1A, Grade 2 inflammation and stage 11 fibrosis. He was begun on triple therapy on July 9,2012. His treatment was complicated by anemia with hemoglobin dropping from 14g to less than 8 gram. He was transfused with 2 units of PRBCs at [facility]. HCV RNA was negative in February of this year.  Patient completed antiviral therapy on 06/27/2011  He tells me he is doing good. He denies abdominal pain.  Appetite is good. No weight loss. No jaundice. He for the most part feels good. He is working full time. No depression,.       Review of Systems see hpi Current Outpatient Prescriptions  Medication Sig Dispense Refill  . ALPRAZolam (XANAX) 0.5 MG tablet Take 0.5 mg by mouth. As Needed for Anxiety      . amLODipine (NORVASC) 5 MG tablet Take 1 tablet (5 mg total) by mouth daily.  30 tablet  6  . aspirin 81 MG tablet Take 81 mg by mouth daily.        . citalopram (CELEXA) 40 MG tablet Take 0.5 tablets (20 mg total) by mouth daily.  30 tablet  5  . clopidogrel (PLAVIX) 75 MG tablet Take 1 tablet (75 mg total) by mouth daily.  30 tablet  6  . cyclobenzaprine (FLEXERIL) 10 MG tablet Take 10 mg by mouth 3 (three) times daily as needed.      [name] HYDROcodone-acetaminophen (VICODIN) 5-500 MG per tablet Take 1 tablet by mouth as needed.       . isosorbide mononitrate (IMDUR) 30 MG 24 hr tablet Take 1 tablet (30 mg total) by mouth daily.  30 tablet  6  . nitroGLYCERIN (NITROSTAT) 0.4 MG SL tablet Place 0.4 mg under the tongue every 5 (five) minutes as needed.        [name] omeprazole-sodium bicarbonate (ZEGERID) 40-1100 MG per capsule TAKE ONE CAPSULE BY MOUTH AT BEDTIME  30 capsule  6  . pravastatin (PRAVACHOL) 20 MG tablet Take 1 tablet (20 mg total) by mouth every evening.  30 tablet  6   Past Medical History  Diagnosis Date  . Hepatitis C      Hepatitis 1C.  [name] CAD (coronary artery disease)     Percutaneous coronary intervention in 2007 2008  . Hyperlipidemia   . DDD (degenerative disc disease)   . GERD (gastroesophageal reflux disease)   . Reflux   . Abdominal pain, right upper quadrant   . Depression   . COPD (chronic obstructive pulmonary disease)    History   Social History  . Marital Status: Married    Spouse Name: N/A    Number of Children: N/A  . Years of Education: N/A   Occupational History  . Not on file.   Social History Main Topics  . Smoking status: Former Smoker    Types: Cigarettes    Quit date: 02/28/2008  . Smokeless tobacco: Never Used  . Alcohol Use: No  . Drug Use: No  . Sexually Active: Not on file   Other Topics Concern  . Not on file   Social History Narrative  . No narrative on file   Past Surgical History  Procedure Date  . Liver biopsy 03/24/2010   Family Status  Relation Status Death Age  . Mother Alive  heart problems  . Father Deceased 28    Liver Cancer  . Sister Deceased 56    breathing problems  . Brother Alive   . Brother Alive     back problems  . Brother Alive   . Brother Alive   . Son Alive   . Son Alive        Objective:   Physical Exam  Filed Vitals:   11/14/11 1049  Height: 5\' 9"  (1.753 m)  Weight: 173 lb 8 oz (78.699 kg)  Alert and oriented. Skin warm and dry. Oral mucosa is moist.   . Sclera anicteric, conjunctivae is pink. Thyroid not enlarged. No cervical lymphadenopathy. Lungs clear. Heart regular rate and rhythm.  Abdomen is soft. Bowel sounds are positive. No hepatomegaly. No abdominal masses felt. No tenderness.  No edema to lower extremities.   CBC    Component Value Date/Time   WBC 2.9* 06/14/2011 1310   RBC 3.01* 06/14/2011 1310   HGB 9.9* 06/14/2011 1310   HCT 31.1* 06/14/2011 1310   PLT 134* 06/14/2011 1310   MCV 103.3* 06/14/2011 1310   MCH 32.9 06/14/2011 1310   MCHC 31.8 06/14/2011 1310   RDW 16.6* 06/14/2011 1310    LYMPHSABS 1.3 06/14/2011 1310   MONOABS 0.3 06/14/2011 1310   EOSABS 0.0 06/14/2011 1310   BASOSABS 0.0 06/14/2011 1310         Assessment:    Chronic Hepatitis. Negative Hep C RNA Quant in February. He is doing well.   No depression.    Plan:    CBC, CMET, Hep C RNA Quant. OV in 6 months.

## 2011-11-21 NOTE — Progress Notes (Signed)
This encounter was created in error - please disregard.

## 2011-12-06 ENCOUNTER — Telehealth (INDEPENDENT_AMBULATORY_CARE_PROVIDER_SITE_OTHER): Payer: Self-pay | Admitting: *Deleted

## 2011-12-06 NOTE — Telephone Encounter (Signed)
Roy [name] had called office on 11/29/11 and 12/05/11 requesting that Dr.Rehman increase Roy [name]'s Celexa back to 40 mg. States that he did much better with his depression on this dose rather than the 20 mg.  This was discussed with Dr.Rehman and he feel that since Roy [name]'s Hep C treatment has ended , his Primary Care Physician should now handle this medication. Roy [name] called and given Dr.Rehman's recommendation.

## 2011-12-08 ENCOUNTER — Encounter (INDEPENDENT_AMBULATORY_CARE_PROVIDER_SITE_OTHER): Payer: BC Managed Care – PPO | Admitting: Internal Medicine

## 2012-01-13 ENCOUNTER — Other Ambulatory Visit: Payer: Self-pay | Admitting: Cardiology

## 2012-01-13 MED ORDER — AMLODIPINE BESYLATE 5 MG PO TABS: 5.0000 mg | ORAL_TABLET | Freq: Every day | ORAL | Status: DC

## 2012-01-16 ENCOUNTER — Telehealth (INDEPENDENT_AMBULATORY_CARE_PROVIDER_SITE_OTHER): Payer: Self-pay | Admitting: *Deleted

## 2012-01-16 DIAGNOSIS — K219 Gastro-esophageal reflux disease without esophagitis: Secondary | ICD-10-CM

## 2012-01-16 DIAGNOSIS — Z8719 Personal history of other diseases of the digestive system: Secondary | ICD-10-CM

## 2012-01-16 MED ORDER — OMEPRAZOLE-SODIUM BICARBONATE 40-1100 MG PO CAPS: 1.0000 | ORAL_CAPSULE | Freq: Every day | ORAL | Status: DC

## 2012-01-16 NOTE — Telephone Encounter (Signed)
Omepra/Bicar 40/1100 refill - take one capsule by mouth at bedtime

## 2012-03-09 ENCOUNTER — Encounter (INDEPENDENT_AMBULATORY_CARE_PROVIDER_SITE_OTHER): Payer: Self-pay | Admitting: *Deleted

## 2012-05-16 ENCOUNTER — Ambulatory Visit (INDEPENDENT_AMBULATORY_CARE_PROVIDER_SITE_OTHER): Payer: BC Managed Care – PPO | Admitting: Internal Medicine

## 2012-05-16 ENCOUNTER — Encounter (INDEPENDENT_AMBULATORY_CARE_PROVIDER_SITE_OTHER): Payer: Self-pay | Admitting: Internal Medicine

## 2012-05-16 VITALS — BP 140/78 | HR 60 | Temp 97.0°F | Ht 68.0 in | Wt 173.9 lb

## 2012-05-16 DIAGNOSIS — B1920 Unspecified viral hepatitis C without hepatic coma: Secondary | ICD-10-CM

## 2012-05-16 LAB — COMPREHENSIVE METABOLIC PANEL
AST: 10 U/L (ref 0–37)
Albumin: 4.7 g/dL (ref 3.5–5.2)
Alkaline Phosphatase: 72 U/L (ref 39–117)
Glucose, Bld: 91 mg/dL (ref 70–99)
Potassium: 4.6 mEq/L (ref 3.5–5.3)
Sodium: 140 mEq/L (ref 135–145)
Total Protein: 7.4 g/dL (ref 6.0–8.3)

## 2012-05-16 NOTE — Progress Notes (Signed)
Subjective:     Patient ID: Roy [name], male   DOB: 01-14-1956, 56 y.o.   MRN: [id number]  HPI Roy [name] is a 56 yr old male here today for f/u of his Hepatitis C, genotype 1A, Grade 2 inflammation and stage 11 fibrosis.    [name] He was begun on triple therapy on July 9,2012. His treatment was complicated by anemia with hemoglobin dropping from 14g to less than 8 gram. He was transfused with 2 units of PRBCs at [facility]. HCV RNA was negative in February of this year. Patient completed antiviral therapy on 06/27/2011 He tells me he is doing good. He denies abdominal pain.  Appetite is good. No weight loss. No jaundice. He for the most part feels good. He is working full time. No depression. He tells me he aches in his bones when it is cold, 11/2011: Hep C Quaint none detected.   Review of Systems see hpi Current Outpatient Prescriptions  Medication Sig Dispense Refill  . ALPRAZolam (XANAX) 0.5 MG tablet Take 0.5 mg by mouth. As Needed for Anxiety      . amLODipine (NORVASC) 5 MG tablet Take 1 tablet (5 mg total) by mouth daily.  30 tablet  6  . aspirin 81 MG tablet Take 81 mg by mouth daily.        . citalopram (CELEXA) 40 MG tablet Take 0.5 tablets (20 mg total) by mouth daily.  30 tablet  5  . clopidogrel (PLAVIX) 75 MG tablet Take 1 tablet (75 mg total) by mouth daily.  30 tablet  6  . HYDROcodone-acetaminophen (VICODIN) 5-500 MG per tablet Take 1 tablet by mouth as needed.       . nitroGLYCERIN (NITROSTAT) 0.4 MG SL tablet Place 0.4 mg under the tongue every 5 (five) minutes as needed.        [name] omeprazole-sodium bicarbonate (ZEGERID) 40-1100 MG per capsule Take 1 capsule by mouth daily before breakfast.  30 capsule  6  . pravastatin (PRAVACHOL) 20 MG tablet Take 1 tablet (20 mg total) by mouth every evening.  30 tablet  6  . cyclobenzaprine (FLEXERIL) 10 MG tablet Take 10 mg by mouth 3 (three) times daily as needed.       Past Medical History  Diagnosis Date  . Hepatitis C    Hepatitis 1C.  [name]  CAD (coronary artery disease)     Percutaneous coronary intervention in 2007 2008  . Hyperlipidemia   . DDD (degenerative disc disease)   . GERD (gastroesophageal reflux disease)   . Reflux   . Abdominal pain, right upper quadrant   . Depression   . COPD (chronic obstructive pulmonary disease)    Allergies  Allergen Reactions  . Codeine   . Food     PEACHES  . Penicillins         Objective:   Physical Exam  Filed Vitals:   05/16/12 0959  BP: 140/78  Pulse: 60  Temp: 97 F (36.1 C)  Height: 5\' 8"  (1.727 m)  Weight: 173 lb 14.4 oz (78.881 kg)  Alert and oriented. Skin warm and dry. Oral mucosa is moist.   . Sclera anicteric, conjunctivae is pink. Thyroid not enlarged. No cervical lymphadenopathy. Lungs clear. Heart regular rate and rhythm.  Abdomen is soft. Bowel sounds are positive. No hepatomegaly. No abdominal masses felt. No tenderness.  No edema to lower extremities. Patient is alert and oriented.       Assessment:    Hep C. Viral load is undetectable  at this    Plan:    OV in 6 months with Dr. Karilyn Cota.  Cmet, AFP, Hep C Quaint.

## 2012-05-16 NOTE — Patient Instructions (Addendum)
OV in 6 months with Dr. Karilyn Cota. Continue present medication

## 2012-11-13 ENCOUNTER — Ambulatory Visit (INDEPENDENT_AMBULATORY_CARE_PROVIDER_SITE_OTHER): Payer: BC Managed Care – PPO | Admitting: Internal Medicine

## 2012-11-13 ENCOUNTER — Encounter (INDEPENDENT_AMBULATORY_CARE_PROVIDER_SITE_OTHER): Payer: Self-pay | Admitting: Internal Medicine

## 2012-11-13 VITALS — BP 120/80 | HR 78 | Temp 97.5°F | Resp 18 | Ht 69.0 in | Wt 167.4 lb

## 2012-11-13 DIAGNOSIS — Z860100 Personal history of colon polyps, unspecified: Secondary | ICD-10-CM | POA: Insufficient documentation

## 2012-11-13 DIAGNOSIS — K759 Inflammatory liver disease, unspecified: Secondary | ICD-10-CM

## 2012-11-13 NOTE — Progress Notes (Signed)
Presenting complaint;  History of colonic polyps and hepatitis C(successfully treated).  Subjective:  Patient is 57 year old Caucasian male patient of Dr. Margaretmary Eddy who is here for scheduled visit. He was last seen in November 2013. He has history of chronic hepatitis C genotype 1 stage II disease and was treated with Pegasys, collateral rare and ribavirin for 6 months with SVR. He finished therapy in December 2012. HCVRNA was negative in November 2013. He also has history of colonic polyps. He has lost 6 pounds since his last visit he states he has very good appetite. He denies nausea vomiting abdominal pain melena or rectal bleeding. Every now and then he becomes constipated because he has to take pain medication for his back pain and arthritis involving his knees. He takes pain medication on days he walks. He states it is becoming harder and harder for him to work and he is going to apply for disability. His heartburn is well controlled with therapy and he denies dysphagia. He wants to know when he will need another colonoscopy.  Current Medications: Current Outpatient Prescriptions  Medication Sig Dispense Refill  . ALPRAZolam (XANAX) 0.5 MG tablet Take 0.5 mg by mouth. As Needed for Anxiety      . amLODipine (NORVASC) 5 MG tablet Take 1 tablet (5 mg total) by mouth daily.  30 tablet  6  . aspirin 81 MG tablet Take 81 mg by mouth daily.        . citalopram (CELEXA) 40 MG tablet Take 0.5 tablets (20 mg total) by mouth daily.  30 tablet  5  . clopidogrel (PLAVIX) 75 MG tablet Take 1 tablet (75 mg total) by mouth daily.  30 tablet  6  . cyclobenzaprine (FLEXERIL) 10 MG tablet Take 10 mg by mouth 3 (three) times daily as needed.      [name] HYDROcodone-acetaminophen (VICODIN) 5-500 MG per tablet Take 1 tablet by mouth as needed.       . nitroGLYCERIN (NITROSTAT) 0.4 MG SL tablet Place 0.4 mg under the tongue every 5 (five) minutes as needed.        [name] omeprazole-sodium bicarbonate (ZEGERID) 40-1100 MG  per capsule Take 1 capsule by mouth daily before breakfast.  30 capsule  6  . pravastatin (PRAVACHOL) 20 MG tablet Take 1 tablet (20 mg total) by mouth every evening.  30 tablet  6   No current facility-administered medications for this visit.     Objective: Blood pressure 120/80, pulse 78, temperature 97.5 F (36.4 C), temperature source Oral, resp. rate 18, height 5\' 9"  (1.753 m), weight 167 lb 6.4 oz (75.932 kg). Patient is alert and in no acute distress. Conjunctiva is pink. Sclera is nonicteric Oropharyngeal mucosa is normal. No neck masses or thyromegaly noted. Abdomen is soft and nontender without organomegaly or masses. No LE edema or clubbing noted.  Labs/studies Results: Data from 05/16/2012 Bilirubin 0.94, AP 72, AST 10, ALT 10, protein 7.4 and albumin 4.7. Calcium was 9.7 Electrolytes normal. BUN 12 and creatinine 0.94 HCVRNA by PCR was undetectable.  Assessment:  #1. History of hepatitis C stage II disease treated successfully with 3 drugs. He had negative HCVRNA 3 and 11 months after treatment. He does not need any followup testing other than yearly LFTs. Since he had stage II disease he does not need to be screened for [facility]. #2. History of colonic polyps. He has undergone 2 colonoscopies in the last one was in September 2011. All in all he had 9 tubular adenomas removed. He will  need another colonoscopy this year which she'll have to be off Plavix. #3. Minimal weight loss. He has no alarm symptoms. #4. History of erosive reflux esophagitis. He is doing well with therapy.   Plan:  Patient will call if he loses another 5 pounds or so. Will schedule colonoscopy within the next 3-4 months. Office visit in one year.

## 2012-11-13 NOTE — Patient Instructions (Signed)
Notify if your weight drops by more than 5 pounds. Will review colonoscopy records from [facility] and let you know the timing of  next exam.

## 2012-11-20 ENCOUNTER — Telehealth (INDEPENDENT_AMBULATORY_CARE_PROVIDER_SITE_OTHER): Payer: Self-pay | Admitting: Internal Medicine

## 2012-11-20 NOTE — Telephone Encounter (Signed)
TCS noted in computer for 03/2013

## 2012-11-20 NOTE — Telephone Encounter (Signed)
Patient has had 2 colonoscopies. The last one was in September 2011. He had multiple tubular adenomas removed on both of these procedures. Next colonoscopy would be in September 2014. Patient's wife notified

## 2012-11-21 ENCOUNTER — Encounter (INDEPENDENT_AMBULATORY_CARE_PROVIDER_SITE_OTHER): Payer: Self-pay

## 2012-12-13 ENCOUNTER — Other Ambulatory Visit (INDEPENDENT_AMBULATORY_CARE_PROVIDER_SITE_OTHER): Payer: Self-pay | Admitting: Internal Medicine

## 2012-12-13 NOTE — Telephone Encounter (Signed)
This has already been addressed

## 2013-01-29 DIAGNOSIS — I200 Unstable angina: Secondary | ICD-10-CM

## 2013-01-30 ENCOUNTER — Encounter (HOSPITAL_COMMUNITY)
Admission: AD | Disposition: A | Discharge status: Home / Self Care | Payer: Self-pay | Source: Other Acute Inpatient Hospital | Attending: Internal Medicine

## 2013-01-30 ENCOUNTER — Encounter (HOSPITAL_COMMUNITY): Payer: Self-pay | Admitting: General Practice

## 2013-01-30 ENCOUNTER — Observation Stay (HOSPITAL_COMMUNITY)
Admission: AD | Admit: 2013-01-30 | Discharge: 2013-01-30 | Disposition: A | Discharge status: Home / Self Care | Payer: BC Managed Care – PPO | Source: Other Acute Inpatient Hospital | Attending: Internal Medicine | Admitting: Internal Medicine

## 2013-01-30 ENCOUNTER — Other Ambulatory Visit: Payer: Self-pay | Admitting: Physician Assistant

## 2013-01-30 DIAGNOSIS — Z79899 Other long term (current) drug therapy: Secondary | ICD-10-CM | POA: Insufficient documentation

## 2013-01-30 DIAGNOSIS — I219 Acute myocardial infarction, unspecified: Secondary | ICD-10-CM

## 2013-01-30 DIAGNOSIS — I200 Unstable angina: Secondary | ICD-10-CM

## 2013-01-30 DIAGNOSIS — J449 Chronic obstructive pulmonary disease, unspecified: Secondary | ICD-10-CM | POA: Insufficient documentation

## 2013-01-30 DIAGNOSIS — I10 Essential (primary) hypertension: Secondary | ICD-10-CM | POA: Insufficient documentation

## 2013-01-30 DIAGNOSIS — J4489 Other specified chronic obstructive pulmonary disease: Secondary | ICD-10-CM | POA: Insufficient documentation

## 2013-01-30 DIAGNOSIS — I2510 Atherosclerotic heart disease of native coronary artery without angina pectoris: Secondary | ICD-10-CM

## 2013-01-30 DIAGNOSIS — E785 Hyperlipidemia, unspecified: Secondary | ICD-10-CM | POA: Insufficient documentation

## 2013-01-30 DIAGNOSIS — I498 Other specified cardiac arrhythmias: Secondary | ICD-10-CM | POA: Insufficient documentation

## 2013-01-30 DIAGNOSIS — Z7902 Long term (current) use of antithrombotics/antiplatelets: Secondary | ICD-10-CM | POA: Insufficient documentation

## 2013-01-30 DIAGNOSIS — Z9861 Coronary angioplasty status: Secondary | ICD-10-CM | POA: Insufficient documentation

## 2013-01-30 HISTORY — DX: Calculus of kidney: N20.0

## 2013-01-30 HISTORY — DX: Bradycardia, unspecified: R00.1

## 2013-01-30 HISTORY — DX: Acute myocardial infarction, unspecified: I21.9

## 2013-01-30 HISTORY — PX: LEFT HEART CATHETERIZATION WITH CORONARY ANGIOGRAM: SHX5451

## 2013-01-30 HISTORY — DX: Essential (primary) hypertension: I10

## 2013-01-30 LAB — HEPARIN LEVEL (UNFRACTIONATED): Heparin Unfractionated: <0.1 IU/mL — ABNORMAL LOW (ref 0.30–0.70)

## 2013-01-30 SURGERY — LEFT HEART CATHETERIZATION WITH CORONARY ANGIOGRAM
Anesthesia: LOCAL

## 2013-01-30 MED ORDER — NITROGLYCERIN 0.4 MG SL SUBL: 0.4000 mg | SUBLINGUAL_TABLET | SUBLINGUAL | Status: DC | PRN

## 2013-01-30 MED ORDER — SODIUM CHLORIDE 0.9 % IJ SOLN: 3.0000 mL | Freq: Two times a day (BID) | INTRAMUSCULAR | Status: DC

## 2013-01-30 MED ORDER — LISINOPRIL 10 MG PO TABS
10.0000 mg | ORAL_TABLET | Freq: Every day | ORAL | Status: DC
  Administered 2013-01-30: 10 mg via ORAL
  Filled 2013-01-30: qty 1

## 2013-01-30 MED ORDER — PRAVASTATIN SODIUM 20 MG PO TABS: 20.0000 mg | ORAL_TABLET | Freq: Every evening | ORAL | Status: DC

## 2013-01-30 MED ORDER — ALPRAZOLAM 0.25 MG PO TABS: 0.2500 mg | ORAL_TABLET | Freq: Two times a day (BID) | ORAL | Status: DC

## 2013-01-30 MED ORDER — HEPARIN SODIUM (PORCINE) 1000 UNIT/ML IJ SOLN
INTRAMUSCULAR | Status: AC
  Filled 2013-01-30: qty 1

## 2013-01-30 MED ORDER — SIMVASTATIN 10 MG PO TABS
10.0000 mg | ORAL_TABLET | Freq: Every day | ORAL | Status: DC
  Filled 2013-01-30: qty 1

## 2013-01-30 MED ORDER — SODIUM CHLORIDE 0.9 % IV SOLN: 250.0000 mL | INTRAVENOUS | Status: DC | PRN

## 2013-01-30 MED ORDER — SODIUM CHLORIDE 0.9 % IJ SOLN: 3.0000 mL | INTRAMUSCULAR | Status: DC | PRN

## 2013-01-30 MED ORDER — CITALOPRAM HYDROBROMIDE 40 MG PO TABS
40.0000 mg | ORAL_TABLET | Freq: Every day | ORAL | Status: DC
  Administered 2013-01-30: 40 mg via ORAL
  Filled 2013-01-30: qty 1

## 2013-01-30 MED ORDER — ASPIRIN 81 MG PO CHEW: 324.0000 mg | CHEWABLE_TABLET | ORAL | Status: DC

## 2013-01-30 MED ORDER — FENTANYL CITRATE 0.05 MG/ML IJ SOLN
INTRAMUSCULAR | Status: AC
  Filled 2013-01-30: qty 2

## 2013-01-30 MED ORDER — HEPARIN (PORCINE) IN NACL 100-0.45 UNIT/ML-% IJ SOLN
1100.0000 [IU]/h | INTRAMUSCULAR | Status: DC
  Filled 2013-01-30: qty 250

## 2013-01-30 MED ORDER — VERAPAMIL HCL 2.5 MG/ML IV SOLN
INTRAVENOUS | Status: AC
  Filled 2013-01-30: qty 2

## 2013-01-30 MED ORDER — LIDOCAINE HCL (PF) 1 % IJ SOLN
INTRAMUSCULAR | Status: AC
  Filled 2013-01-30: qty 30

## 2013-01-30 MED ORDER — PANTOPRAZOLE SODIUM 40 MG PO TBEC
40.0000 mg | DELAYED_RELEASE_TABLET | Freq: Every day | ORAL | Status: DC
  Administered 2013-01-30: 40 mg via ORAL
  Filled 2013-01-30: qty 1

## 2013-01-30 MED ORDER — ASPIRIN EC 81 MG PO TBEC: 81.0000 mg | DELAYED_RELEASE_TABLET | Freq: Every day | ORAL | Status: DC

## 2013-01-30 MED ORDER — POTASSIUM CHLORIDE CRYS ER 20 MEQ PO TBCR
40.0000 meq | EXTENDED_RELEASE_TABLET | Freq: Once | ORAL | Status: AC
  Administered 2013-01-30: 40 meq via ORAL
  Filled 2013-01-30: qty 2

## 2013-01-30 MED ORDER — SODIUM CHLORIDE 0.9 % IV SOLN
INTRAVENOUS | Status: DC
  Administered 2013-01-30: 12:00:00 via INTRAVENOUS

## 2013-01-30 MED ORDER — MIDAZOLAM HCL 2 MG/2ML IJ SOLN
INTRAMUSCULAR | Status: AC
  Filled 2013-01-30: qty 2

## 2013-01-30 MED ORDER — SODIUM CHLORIDE 0.9 % IV SOLN
INTRAVENOUS | Status: AC
Start: 2013-01-30 — End: 2013-01-30

## 2013-01-30 MED ORDER — ONDANSETRON HCL 4 MG/2ML IJ SOLN: 4.0000 mg | Freq: Four times a day (QID) | INTRAMUSCULAR | Status: DC | PRN

## 2013-01-30 MED ORDER — ACETAMINOPHEN 325 MG PO TABS: 650.0000 mg | ORAL_TABLET | ORAL | Status: DC | PRN

## 2013-01-30 MED ORDER — AMLODIPINE BESYLATE 5 MG PO TABS: 5.0000 mg | ORAL_TABLET | Freq: Every day | ORAL | Status: DC

## 2013-01-30 MED ORDER — CLOPIDOGREL BISULFATE 75 MG PO TABS: 75.0000 mg | ORAL_TABLET | Freq: Every day | ORAL | Status: DC

## 2013-01-30 MED FILL — Heparin Sodium (Porcine) 100 Unt/ML in Sodium Chloride 0.45%: INTRAMUSCULAR | Qty: 250 | Status: AC

## 2013-01-30 NOTE — Progress Notes (Signed)
Pt d/c in wheel chair. Pt's R. Radial dressing is clean dry and intact. Discharge instructions reviewed and sent home with pt. V/S stable.

## 2013-01-30 NOTE — Interval H&P Note (Signed)
Cath Lab Visit (complete for each Cath Lab visit)  Clinical Evaluation Leading to the Procedure:   ACS: yes       History and Physical Interval Note:  01/30/2013 3:00 PM  Roy [name]  has presented today for surgery, with the diagnosis of [place]  The various methods of treatment have been discussed with the patient and family. After consideration of risks, benefits and other options for treatment, the patient has consented to  Procedure(s): LEFT HEART CATHETERIZATION WITH CORONARY ANGIOGRAM (N/A) as a surgical intervention .  The patient's history has been reviewed, patient examined, no change in status, stable for surgery.  I have reviewed the patient's chart and labs.  Questions were answered to the patient's satisfaction.     Asaf Amaru Burroughs

## 2013-01-30 NOTE — Discharge Summary (Signed)
Discharge Summary   Roy ID: Roy [name] MRN: [id number], DOB/AGE: June 09, 1956 57 y.o. Admit date: 01/30/2013 D/C date:     01/30/2013  Primary Cardiologist: Previously De[facility]ent, transitioned to Urology [name] [name] Roy [name]  Primary Discharge Diagnoses:  1. Unstable angina 2. CAD - cath 01/30/13 with diffuse 3V CAD without clear culprit lesion, normal LV EF 55-60%, med rx recommended, if CP continues would consider Myoview to try to localize ischemia or FFR of mid LAD lesion - prior history of BMS to OM and RCA, subsequent cutting balloon angioplasty due to ISR in these distributions, also BMS placement with DES to LAD 3. HTN 4. HL 5. Asymptomatic sinus bradycardia  Secondary Discharge Diagnoses:  1. Hepatitis C 2. DDD 3. GERD 4. Reflux 5. Abdominal pain, RUQ 6. Depression 7. COPD 8. Kidney stones  Hospital Course: Roy [name] is a 57 y/o M with history of CAD s/p multiple PCI, HTN, HL, hepatitis C, claudication with previously normal ABIs, GERD who presented as a transfer to Roy [name] from Roy [name] for evaluation of chest pain. He was admitted to Roy [name] from Roy [name] for observation after a routine visit. He reported at least a 2 week history of progressive exertional angina, responsive to NTG, also baseline intermittent chest discomfort that radiated up into his neck not consistently related to exertion. ECG from Roy [name] showed sinus bradycardia without acute ST abnormalities. Cardiac markers were negative. pBNP WNL. D-dimer WNL.Given the nature of his discomfort, cardiac cath was recommended. He was transferred to Roy [name] for this procedure which demonstrated diffuse 3v CAD without clear culprit lesion. Continued medical therapy was recommended, along with continued PPI. He is not a candidate for BB due to bradycardia and has not tolerated Imdur in the past. Dr. Gala [name] feels that if CP continues would consider Myoview to try to localize  ischemia or FFR of mid LAD lesion. The films were reviewed with Dr. Clifton [name]. The Roy was observed post-procedure. Dr. Gala [name] has seen and examined the Roy today and feels he is stable for discharge.  Discharge Vitals: Blood pressure 135/82, pulse 65, temperature 98.3 F (36.8 C), temperature source Oral, resp. rate 18, height 5' 8.9" (1.75 m), weight 167 lb 5.3 oz (75.9 kg), SpO2 98.00%.  Labs: Wbc 7.8, Hgb 13.6, Hct 38.9, Plt 182 Glu 117, bun 14, cr 0.96, na 137, k 3.4, cl 104, co2 28, tprot 7.8, albumin 3.9, ca 8.9 troponins neg x3  Diagnostic Studies/Procedures   Cardiac catheterization this admission, please see full report and above for summary.   CXR at [facility]: reported as no infiltrate per Dr. Margaretmary [name] note. There is no CXR listed in [place] system from this admission (called over to verify).  Discharge Medications     Medication List         ALPRAZolam 0.5 MG tablet  Commonly known as:  XANAX  Take 0.5 mg by mouth. As Needed for Anxiety     amLODipine 5 MG tablet  Commonly known as:  NORVASC  Take 1 tablet (5 mg total) by mouth daily.     aspirin 81 MG tablet  Take 81 mg by mouth daily.     citalopram 40 MG tablet  Commonly known as:  CELEXA  Take 0.5 tablets (20 mg total) by mouth daily.     clopidogrel 75 MG tablet  Commonly known as:  PLAVIX  Take 1 tablet (75 mg total) by mouth daily.     cyclobenzaprine 10 MG tablet  Commonly  known as:  FLEXERIL  Take 10 mg by mouth 3 (three) times daily as needed.     HYDROcodone-acetaminophen 5-500 MG per tablet  Commonly known as:  VICODIN  Take 1 tablet by mouth as needed.     nitroGLYCERIN 0.4 MG SL tablet  Commonly known as:  NITROSTAT  Place 0.4 mg under the tongue every 5 (five) minutes as needed.     omeprazole-sodium bicarbonate 40-1100 MG per capsule  Commonly known as:  ZEGERID  TAKE 1 CAPSULE BY MOUTH AT BEDTIME.     pravastatin 20 MG tablet  Commonly known as:  PRAVACHOL  Take  1 tablet (20 mg total) by mouth every evening.        Disposition   The Roy will be discharged in stable condition to home. Discharge Orders   Future Appointments Provider Department Dept Phone   02/21/2013 2:00 PM Prescott Parma, PA-C [place] (near [place]) [phone]   02/27/2013 1:40 PM Laqueta Linden, MD [name] (near [place]) [phone]   Future Orders Complete By Expires     Diet - low sodium heart healthy  As directed     Increase activity slowly  As directed     Comments:      No driving for 2 days. No lifting over 5 lbs for 1 week. No sexual activity for 1 week. You may return to work on 02/04/13. Keep procedure site clean & dry. If you notice increased pain, swelling, bleeding or pus, call/return!  You may shower, but no soaking baths/hot tubs/pools for 1 week.      Follow-up Information   Follow up with SERPE, EUGENE, PA-C. (02/21/13 at 2pm)    Contact information:   [address], [address] [place] [address] [phone]        Duration of Discharge Encounter: Greater than 30 minutes including physician and PA time.  Signed, Roy Spies PA-C 01/30/2013, 4:48 PM  Roy seen and examined with Roy Spies, PA-C. We discussed all aspects of the encounter. I agree with the assessment and plan as stated above. See my cath note for further details.   Roy [name] 4:57 PM

## 2013-01-30 NOTE — Progress Notes (Signed)
ANTICOAGULATION CONSULT NOTE - Initial Consult  Pharmacy Consult for Heparin Indication: chest pain/ACS  Allergies  Allergen Reactions  . Codeine   . Food     PEACHES  . Penicillins     Patient Measurements: Height: 5' 8.9" (175 cm) Weight: 167 lb 5.3 oz (75.9 kg) IBW/kg (Calculated) : 70.47 Heparin Dosing Weight: 75.9 kg  Vital Signs:    Labs: No results found for this basename: HGB, HCT, PLT, APTT, LABPROT, INR, HEPARINUNFRC, CREATININE, CKTOTAL, CKMB, TROPONINI,  in the last 72 hours  Estimated Creatinine Clearance: 86.5 ml/min (by C-G formula based on Cr of 0.94).   Medical History: Past Medical History  Diagnosis Date  . Hepatitis C    Hepatitis 1C.  [name] CAD (coronary artery disease)     Percutaneous coronary intervention in 2007 2008  . Hyperlipidemia   . DDD (degenerative disc disease)   . GERD (gastroesophageal reflux disease)   . Reflux   . Abdominal pain, right upper quadrant   . Depression   . COPD (chronic obstructive pulmonary disease)     Medications:  Scheduled:  . ALPRAZolam  0.25 mg Oral BID  . [START ON 01/31/2013] aspirin  324 mg Oral Pre-Cath  . [START ON 01/31/2013] aspirin EC  81 mg Oral Daily  . citalopram  40 mg Oral Daily  . [START ON 01/31/2013] clopidogrel  75 mg Oral Q breakfast  . lisinopril  10 mg Oral Daily  . pantoprazole  40 mg Oral Q1200  . simvastatin  10 mg Oral q1800  . sodium chloride  3 mL Intravenous Q12H   Infusions:  . sodium chloride 75 mL/hr at 01/30/13 1135    Assessment:  57 yo M [facility] with chest pain/ACS and started on IV heparin. Patient transferred to [facility] for cath.  Patient therapeutic at current heparin rate per outside labs.  CBC stable per outside labs.  Goal of Therapy:  Heparin level 0.3-0.7 units/ml Monitor platelets by anticoagulation protocol: Yes   Plan: - Change Heparin rate to 1100 units/hr - Check heparin level at 1900 today - Daily heparin level and CBC - Monitor for s/s of  bleeding  Shelba Flake. Achilles Dunk, PharmD Clinical Pharmacist - Resident Pager: [phone] Pharmacy: [phone] 01/30/2013 12:14 PM

## 2013-01-30 NOTE — CV Procedure (Signed)
Cardiac Cath Procedure Note:  Indication: [place]  Procedures performed:  1) Selective coronary angiography 2) Left heart catheterization 3) Left ventriculogram  Description of procedure:   The risks and indication of the procedure were explained. Consent was signed and placed on the chart. An appropriate timeout was taken prior to the procedure. After a normal Allen's test was confirmed, the right wrist was prepped and draped in the routine sterile fashion and anesthetized with 1% local lidocaine.   A 5 FR arterial sheath was then placed in the right radial artery using a modified Seldinger technique. Systemic heparin was administered. 3mg  IV verapamil was given through the sheath. Standard catheters including a JL 3.5, JR4 and straight pigtail were used. All catheter exchanges were made over a wire.  Complications:  None apparent  Total contrast: 85cc  Findings:  Ao Pressure: 157/89 (118) LV Pressure: 155/6/8 There was no signficant gradient across the aortic valve on pullback.  LM: Large vessel. Distal 30%  LAD: Large vessel wraps apex. 40% proximal. Focal stent widely patent. 50% mid. 70-75% mid  Ramus: Small vessel with 70% lesion proximally with bridging segment.  LCX: Large codominant vessel. Mild plaque in AV groove LCX. OM-1 large vessel with long stented area which is widely patent. OM-2 samll vessel OK. Several small PLs without high -grade lesions  RCA: Moderate-sized codominant vessel.  Proximal 60-70%. Prior to stent 50%. Long stented section in midsection with focal 40% ISR. Moderate diffuse disease in distal vessel with distal  70%   LV-gram done in the RAO projection: Ejection fraction = 55-60% No RWMA. No MR  Assessment: 1. Diffuse 3v CAD without clear culprit lesion 2. Normal LV  Plan/Discussion:  He has diffuse CAD with several borderline lesions but no clear culprit lesions to explain his CP. Will continue medical therapy. If CP continues would consider  Myoview to trt yo localize ischemia or FFR of mid LAD lesion. Films reviewed with Dr. Clifton James.   Dacoda Daijanae Rafalski,MD 3:55 PM

## 2013-01-30 NOTE — H&P (Signed)
57 y/o male with h/o CAD, HTN, HCV and HL transferred from [facility] for cardiac catheterization.   Please see full Cardiology Consult note on chart by Dr. Diona Browner on 01/29/13.  Has known multivessel disease s/p BMS to OM and RCA with subsequent cutting balloon PCI to both stents in past. Also has BMS and DES to LAD.  Presented with accelerating angina responsive to NTG. Trop and ECG ok.  Exam unremarkable. Now CP free.  Assessment:  1) [place] 2) CAD as above 3) HTN 4) HL  Plan:  Cath today  Roy Bensimhon,MD 2:59 PM

## 2013-01-30 NOTE — Progress Notes (Signed)
Pts radial continued to ooze, Thereasa Distance from cath came up to evaluate; he took the band off and waited 10-15 mins, pt had no more oozing, and he placed pressure dressing; Annabelle Harman made aware, said to hold pt for another hr and if no more bleeding ok for pt to go home

## 2013-02-21 ENCOUNTER — Encounter: Payer: Self-pay | Admitting: Physician Assistant

## 2013-02-21 ENCOUNTER — Ambulatory Visit (INDEPENDENT_AMBULATORY_CARE_PROVIDER_SITE_OTHER): Payer: BC Managed Care – PPO | Admitting: Physician Assistant

## 2013-02-21 VITALS — BP 142/82 | HR 56 | Ht 69.0 in | Wt 170.0 lb

## 2013-02-21 DIAGNOSIS — E785 Hyperlipidemia, unspecified: Secondary | ICD-10-CM

## 2013-02-21 DIAGNOSIS — I2510 Atherosclerotic heart disease of native coronary artery without angina pectoris: Secondary | ICD-10-CM

## 2013-02-21 DIAGNOSIS — I10 Essential (primary) hypertension: Secondary | ICD-10-CM

## 2013-02-21 MED ORDER — NITROGLYCERIN 0.4 MG SL SUBL: 0.4000 mg | SUBLINGUAL_TABLET | SUBLINGUAL | Status: DC | PRN

## 2013-02-21 MED ORDER — AMLODIPINE BESYLATE 10 MG PO TABS: 10.0000 mg | ORAL_TABLET | Freq: Every day | ORAL | Status: DC

## 2013-02-21 NOTE — Assessment & Plan Note (Signed)
To be reassessed, following increased dose of amlodipine.

## 2013-02-21 NOTE — Progress Notes (Signed)
Primary Cardiologist: Prentice Docker, MD (new)   HPI: Post hospital followup from [facility], status post cardiac catheterization on July 30, following transfer from [facility] for further evaluation of symptoms worrisome for UAP. Troponins NL.   - Cardiac catheterization: Diffuse 3v CAD without clear culprit lesion; EF 55-60%, no focal WMAs  Recommendation was to treat medically, although noting that addition of beta blocker was precluded secondary to bradycardia, and that patient had not tolerated Imdur in the past. Dr. Gala Romney recommended followup nuclear imaging study to localize area of ischemia, versus FFR of the mid LAD lesion, if patient were to have recurrent CP.  Although he has had some recurrent episodes of CP since his hospitalization, he suggests that these are not nearly as intense as his recent presenting symptoms. Moreover, he has not had to take any more NTG. 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 It was his typical dull midsternal chest pressure, with radiation to the right side of the jaw, but with no other associated symptoms. Of note, he took one Xanax tablet, which was recently added by his primary M.D., and his symptoms resolved after 15 minutes. Also of note, he denies any CP when walking on level ground, but does have significant DOE.  Allergies  Allergen Reactions  . Codeine Itching  . Food     PEACHES  . Imdur [Isosorbide]     Has not tolerated well in the past  . Penicillins     Current Outpatient Prescriptions  Medication Sig Dispense Refill  . ALPRAZolam (XANAX) 0.5 MG tablet Take 0.5 mg by mouth. As Needed for Anxiety      . amLODipine (NORVASC) 10 MG tablet Take 1 tablet (10 mg total) by mouth daily.  30 tablet  6  . aspirin 81 MG tablet Take 81 mg by mouth daily.        . clopidogrel (PLAVIX) 75 MG tablet Take 1 tablet (75 mg total) by mouth daily.  30 tablet  6  . HYDROcodone-acetaminophen (VICODIN) 5-500 MG per tablet Take 1 tablet by  mouth as needed.       . nitroGLYCERIN (NITROSTAT) 0.4 MG SL tablet Place 1 tablet (0.4 mg total) under the tongue every 5 (five) minutes as needed.  25 tablet  3  . omeprazole-sodium bicarbonate (ZEGERID) 40-1100 MG per capsule TAKE 1 CAPSULE BY MOUTH AT BEDTIME.  30 capsule  5  . pravastatin (PRAVACHOL) 20 MG tablet Take 1 tablet (20 mg total) by mouth every evening.      . venlafaxine (EFFEXOR) 37.5 MG tablet Take 37.5 mg by mouth 2 (two) times daily.       No current facility-administered medications for this visit.    Past Medical History  Diagnosis Date  . Hepatitis C    Hepatitis 1C.  [name] CAD (coronary artery disease)     a. Prior history of BMS to OM and RCA, subsequent cutting balloon angioplasty due to ISR in these distributions, also BMS placement with DES to LAD. b. Cath 01/2013: diffuse 3V CAD without clear culprit lesion, normal LVEF.  [name] Hyperlipidemia   . DDD (degenerative disc disease)   . GERD (gastroesophageal reflux disease)   . Reflux   . Abdominal pain, right upper quadrant   . Depression   . COPD (chronic obstructive pulmonary disease)   . Myocardial infarction 2007 ?  [name] Hypertension   . Kidney stones   . Sinus bradycardia     Past Surgical History  Procedure Laterality Date  . Liver biopsy  03/24/2010  . Hernia repair    . Colonoscopy    . Cardiac catheterization    . Coronary angioplasty with stent placement      History   Social History  . Marital Status: Married    Spouse Name: N/A    Number of Children: N/A  . Years of Education: N/A   Occupational History  . Not on file.   Social History Main Topics  . Smoking status: Former Smoker    Types: Cigarettes    Quit date: 02/28/2008  . Smokeless tobacco: Never Used  . Alcohol Use: No  . Drug Use: No  . Sexual Activity: Not on file   Other Topics Concern  . Not on file   Social History Narrative  . No narrative on file    Family History  Problem Relation Age of Onset  . Diabetes  Mother   . Diabetes Brother   . Heart disease Brother   . Diabetes Brother   . Healthy Brother   . Healthy Son   . Healthy Son     ROS: no nausea, vomiting; no fever, chills; no melena, hematochezia; no claudication  PHYSICAL EXAM: BP 142/82  Pulse 56  Ht 5\' 9"  (1.753 m)  Wt 170 lb (77.111 kg)  BMI 25.09 kg/m2 GENERAL: 57 year old male; NAD HEENT: NCAT, PERRLA, EOMI; sclera clear; no xanthelasma NECK: palpable bilateral carotid pulses, no bruits; no JVD; no TM LUNGS: CTA bilaterally CARDIAC: Regular rhythm, decreased rate (S1, S2); no significant murmurs; no rubs or gallops ABDOMEN: soft, non-tender; intact BS EXTREMETIES: R wrist incision site stable, no hematoma; no significant peripheral edema SKIN: warm/dry; no obvious rash/lesions MUSCULOSKELETAL: no joint deformity NEURO: no focal deficit; NL affect   EKG:    ASSESSMENT & PLAN:  CAD, NATIVE VESSEL Results of recent cardiac catheterization were reviewed. Patient continues to have occasional breakthrough angina, but has not had to use any more NTG. Optimization of his medication regimen is constrained, secondary to resting bradycardia and prior history of intolerance to Imdur. Given this, I will increase amlodipine to 10 mg daily for added antianginal benefit, as well as ongoing management of HTN. If he continues to have breakthrough angina, despite this medication adjustment, then I would suggest proceeding with the aforementioned recommendation of either a nuclear imaging study to localize the area of ischemia, or FFR of the mid LAD lesion. We'll also renew prescription for NTG, and arrange patient to return in 1 month for early followup.  HYPERLIPIDEMIA-MIXED Will reassess lipid status with FLP. Recommend aggressive management with target LDL 70 or less, if feasible. Of note, patient has history of hepatitis C, but had recent NL LFTs.  HYPERTENSION, UNSPECIFIED To be reassessed, following increased dose of  amlodipine.    Gene Proctor Carriker, PAC

## 2013-02-21 NOTE — Patient Instructions (Signed)
   Increase Norvasc to 10mg  daily - new sent to pharm  Nitroglycerin refill sent to pharm Continue all other medications.   Follow up in  1 month

## 2013-02-21 NOTE — Assessment & Plan Note (Addendum)
Will reassess lipid status with FLP. Recommend aggressive management with target LDL 70 or less, if feasible. Of note, patient has history of hepatitis C, but had recent NL LFTs.

## 2013-02-21 NOTE — Assessment & Plan Note (Signed)
Results of recent cardiac catheterization were reviewed. Patient continues to have occasional breakthrough angina, but has not had to use any more NTG. Optimization of his medication regimen is constrained, secondary to resting bradycardia and prior history of intolerance to Imdur. Given this, I will increase amlodipine to 10 mg daily for added antianginal benefit, as well as ongoing management of HTN. If he continues to have breakthrough angina, despite this medication adjustment, then I would suggest proceeding with the aforementioned recommendation of either a nuclear imaging study to localize the area of ischemia, or FFR of the mid LAD lesion. We'll also renew prescription for NTG, and arrange patient to return in 1 month for early followup.

## 2013-02-26 ENCOUNTER — Encounter (INDEPENDENT_AMBULATORY_CARE_PROVIDER_SITE_OTHER): Payer: Self-pay | Admitting: *Deleted

## 2013-02-27 ENCOUNTER — Ambulatory Visit: Payer: BC Managed Care – PPO | Admitting: Cardiovascular Disease

## 2013-03-07 ENCOUNTER — Telehealth: Payer: Self-pay | Admitting: *Deleted

## 2013-03-07 ENCOUNTER — Telehealth (INDEPENDENT_AMBULATORY_CARE_PROVIDER_SITE_OTHER): Payer: Self-pay | Admitting: *Deleted

## 2013-03-07 ENCOUNTER — Other Ambulatory Visit (INDEPENDENT_AMBULATORY_CARE_PROVIDER_SITE_OTHER): Payer: Self-pay | Admitting: *Deleted

## 2013-03-07 DIAGNOSIS — Z86010 Personal history of colonic polyps: Secondary | ICD-10-CM

## 2013-03-07 DIAGNOSIS — Z1211 Encounter for screening for malignant neoplasm of colon: Secondary | ICD-10-CM

## 2013-03-07 NOTE — Telephone Encounter (Signed)
Yes, ok to do so, with restarting immediately thereafter if feasible.

## 2013-03-07 NOTE — Telephone Encounter (Signed)
Patient needs movi prep 

## 2013-03-07 NOTE — Telephone Encounter (Addendum)
Patient stated that he had not had lipids done since Dr. Andee Lineman did last.  Last done April 2013.  Patient has follow up OV scheduled for 04/02/2013 with Dr. Purvis Sheffield.  He will wait till this OV to have done, will give order this date.  Liver function is followed by another MD.

## 2013-03-07 NOTE — Telephone Encounter (Signed)
Patient is having repeat colonoscopy on 05/02/13 and GI office need to know if its okay to stop aspirin 2 days prior and , plavix 5 days prior to procedure. Please advise.

## 2013-03-07 NOTE — Telephone Encounter (Signed)
Message copied by Lesle Chris on Thu Mar 07, 2013 12:16 PM ------      Message from: Lesle Chris      Created: Wed Mar 06, 2013 11:59 AM       03/06/2013-11:59 - Left message to return call with male at residence.            ----- Message -----         From: Lesle Chris, LPN         Sent: 02/22/2013           To: Lesle Chris, LPN            Call pt to see if he has had any lipids done recently.      Per gene ov on 02/21/13.         ------

## 2013-03-07 NOTE — Telephone Encounter (Signed)
Yes, ok to do so with restarting immediately afterwards if deemed feasible.

## 2013-03-08 MED ORDER — PEG-KCL-NACL-NASULF-NA ASC-C 100 G PO SOLR: 1.0000 | Freq: Once | ORAL | Status: DC

## 2013-03-08 NOTE — Telephone Encounter (Signed)
Requested information sent to Dr. Patty Sermons office.

## 2013-03-22 ENCOUNTER — Telehealth: Payer: Self-pay | Admitting: Cardiovascular Disease

## 2013-03-22 NOTE — Telephone Encounter (Signed)
Mrs. Swiney called to confirm husband's appointment. She states that they did not anything about Lab work. Please confirm.

## 2013-03-22 NOTE — Telephone Encounter (Signed)
Wife notified via voice mail -   Lesle Chris, LPN at 07/09/1094 12:21 PM    Status: Addendum                  Patient stated that he had not had lipids done since Dr. Andee Lineman did last.  Last done April 2013.  Patient has follow up OV scheduled for 04/02/2013 with Dr. Purvis Sheffield.  He will wait till this OV to have done, will give order this date.  Liver function is followed by another MD.

## 2013-04-02 ENCOUNTER — Encounter: Payer: Self-pay | Admitting: Cardiovascular Disease

## 2013-04-02 ENCOUNTER — Encounter: Payer: Self-pay | Admitting: *Deleted

## 2013-04-02 ENCOUNTER — Ambulatory Visit (INDEPENDENT_AMBULATORY_CARE_PROVIDER_SITE_OTHER): Payer: BC Managed Care – PPO | Admitting: Cardiovascular Disease

## 2013-04-02 VITALS — BP 143/91 | HR 74 | Ht 69.0 in | Wt 170.0 lb

## 2013-04-02 DIAGNOSIS — I209 Angina pectoris, unspecified: Secondary | ICD-10-CM

## 2013-04-02 DIAGNOSIS — Z79899 Other long term (current) drug therapy: Secondary | ICD-10-CM

## 2013-04-02 DIAGNOSIS — I10 Essential (primary) hypertension: Secondary | ICD-10-CM

## 2013-04-02 DIAGNOSIS — E785 Hyperlipidemia, unspecified: Secondary | ICD-10-CM

## 2013-04-02 DIAGNOSIS — I2510 Atherosclerotic heart disease of native coronary artery without angina pectoris: Secondary | ICD-10-CM

## 2013-04-02 MED ORDER — METOPROLOL TARTRATE 25 MG PO TABS: 12.5000 mg | ORAL_TABLET | Freq: Two times a day (BID) | ORAL | Status: DC

## 2013-04-02 NOTE — Progress Notes (Signed)
Patient ID: Roy [name], male   DOB: 10/08/1955, 57 y.o.   MRN: [id number]   SUBJECTIVE: Roy [name] has a h/o CAD with stents and recent cardiac catheterization which did not identify a culprit lesion, albeit he had diffuse 3-vessel disease. A beta blocker was not started at the last office visit due to bradycardia (HR 56 bpm), and he has not tolerated Imdur in the past. When he saw Roy Serpe PA-C in 8/14, amlodipine was increased for both antianginal benefit as well as HTN control.  Today his HR is 74 bpm. 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  He had chest pain this past weekend which lasted 15 minutes, but he laid down and it subsided. He did not take a SL nitro, but he wanted to see if it would resolve spontaneously. The pain radiates into the neck and jaw.  The though after cardiac cath was either to obtain a nuclear myocardial perfusion study to localize the area of ischemia vs FFR of the mid-LAD lesion.    Allergies  Allergen Reactions  . Codeine Itching  . Food     PEACHES  . Imdur [Isosorbide]     Has not tolerated well in the past  . Penicillins     Current Outpatient Prescriptions  Medication Sig Dispense Refill  . ALPRAZolam (XANAX) 0.5 MG tablet Take 0.5 mg by mouth. As Needed for Anxiety      . amLODipine (NORVASC) 10 MG tablet Take 1 tablet (10 mg total) by mouth daily.  30 tablet  6  . aspirin 81 MG tablet Take 81 mg by mouth daily.        . clopidogrel (PLAVIX) 75 MG tablet Take 1 tablet (75 mg total) by mouth daily.  30 tablet  6  . HYDROcodone-acetaminophen (VICODIN) 5-500 MG per tablet Take 1 tablet by mouth as needed.       . nitroGLYCERIN (NITROSTAT) 0.4 MG SL tablet Place 1 tablet (0.4 mg total) under the tongue every 5 (five) minutes as needed.  25 tablet  3  . omeprazole-sodium bicarbonate (ZEGERID) 40-1100 MG per capsule TAKE 1 CAPSULE BY MOUTH AT BEDTIME.  30 capsule  5   . pravastatin (PRAVACHOL) 20 MG tablet Take 1 tablet (20 mg total) by mouth every evening.      . venlafaxine (EFFEXOR) 37.5 MG tablet Take 37.5 mg by mouth 2 (two) times daily.      . peg 3350 powder (MOVIPREP) 100 G SOLR Take 1 kit (200 g total) by mouth once.  1 kit  0   No current facility-administered medications for this visit.    Past Medical History  Diagnosis Date  . Hepatitis C    Hepatitis 1C.  [name] CAD (coronary artery disease)     a. Prior history of BMS to OM and RCA, subsequent cutting balloon angioplasty due to ISR in these distributions, also BMS placement with DES to LAD. b. Cath 01/2013: diffuse 3V CAD without clear culprit lesion, normal LVEF.  [name] Hyperlipidemia   . DDD (degenerative disc disease)   . GERD (gastroesophageal reflux disease)   . Reflux   . Abdominal pain, right upper quadrant   . Depression   . COPD (chronic obstructive pulmonary disease)   . Myocardial infarction 2007 ?  [name] Hypertension   . Kidney stones   . Sinus bradycardia     Past  Surgical History  Procedure Laterality Date  . Liver biopsy  03/24/2010  . Hernia repair    . Colonoscopy    . Cardiac catheterization    . Coronary angioplasty with stent placement      History   Social History  . Marital Status: Married    Spouse Name: N/A    Number of Children: N/A  . Years of Education: N/A   Occupational History  . Not on file.   Social History Main Topics  . Smoking status: Former Smoker    Types: Cigarettes    Quit date: 02/28/2008  . Smokeless tobacco: Never Used  . Alcohol Use: No  . Drug Use: No  . Sexual Activity: Not on file   Other Topics Concern  . Not on file   Social History Narrative  . No narrative on file     Filed Vitals:   04/02/13 1436  BP: 143/91  Pulse: 74  Height: 5\' 9"  (1.753 m)  Weight: 170 lb (77.111 kg)    PHYSICAL EXAM General: NAD Neck: No JVD, no thyromegaly or thyroid nodule.  Lungs: Inspiratory wheezes bilaterally with normal  respiratory effort. CV: Nondisplaced PMI.  Heart regular S1/S2, no S3/+S4, no murmur.  No peripheral edema.  No carotid bruit.  Normal pedal pulses.  Abdomen: Soft, nontender, no hepatosplenomegaly, no distention.  Neurologic: Alert and oriented x 3.  Psych: Normal affect. Extremities: No clubbing or cyanosis.   ECG: reviewed and available in electronic records.   Cardiac cath (01-03-13):  Findings:  Ao Pressure: 157/89 (118)  LV Pressure: 155/6/8  There was no signficant gradient across the aortic valve on pullback.  LM: Large vessel. Distal 30%  LAD: Large vessel wraps apex. 40% proximal. Focal stent widely patent. 50% mid. 70-75% mid  Ramus: Small vessel with 70% lesion proximally with bridging segment.  LCX: Large codominant vessel. Mild plaque in AV groove LCX. OM-1 large vessel with long stented area which is widely patent. OM-2 samll vessel OK. Several small PLs without high -grade lesions  RCA: Moderate-sized codominant vessel. Proximal 60-70%. Prior to stent 50%. Long stented section in midsection with focal 40% ISR. Moderate diffuse disease in distal vessel with distal 70%  LV-gram done in the RAO projection: Ejection fraction = 55-60% No RWMA. No MR   Assessment:  1. Diffuse 3v CAD without clear culprit lesion  2. Normal LV  Plan/Discussion:  He has diffuse CAD with several borderline lesions but no clear culprit lesions to explain his CP. Will continue medical therapy. If CP continues would consider Myoview to trt yo localize ischemia or FFR of mid LAD lesion. Films reviewed with Dr. Clifton [name].     ASSESSMENT AND PLAN: 1. CAD: given that he's having recurrences of angina, I will initiate metoprolol tartrate 12.5 mg bid, as his HR is 74 bpm today. If he experiences significant symptomatic bradycardia from this, I would stop this and consider ranolazine. After optimal medical therapy, if he still has angina, I would consider fractional flow reserve of the LAD to determine the  hemodynamic significance of the lesion vs nuclear myocardial perfusion imaging to locate the area of ischemia.  2. HTN: mildly elevated today, and amlodipine was increased to 10 mg daily in 8/14. Although I am starting metoprolol for its antianginal benefit, it may cause some degree of BP reduction.  3. Hyperlipidemia: will check a lipid panel and LFT's. He does have hepatitis C.  Dispo: f/u in 1 month.  Prentice Docker, M.D., F.A.C.C.

## 2013-04-02 NOTE — Patient Instructions (Signed)
   Begin Metoprolol tart 25mg  - 1/2 tab or 12.5mg  twice a day  - new sent to pharm Continue all other medications.   Lab for fasting lipid & liver panel Office will contact with results via phone or letter.   Follow up in  1 month

## 2013-04-03 ENCOUNTER — Other Ambulatory Visit: Payer: Self-pay | Admitting: Cardiovascular Disease

## 2013-04-03 ENCOUNTER — Telehealth (INDEPENDENT_AMBULATORY_CARE_PROVIDER_SITE_OTHER): Payer: Self-pay | Admitting: *Deleted

## 2013-04-03 LAB — LIPID PANEL
Cholesterol: 208 mg/dL — ABNORMAL HIGH (ref 0–200)
HDL: 34 mg/dL — ABNORMAL LOW (ref >39)
LDL Cholesterol: 143 mg/dL — ABNORMAL HIGH (ref 0–99)
Total CHOL/HDL Ratio: 6.1 Ratio
Triglycerides: 156 mg/dL — ABNORMAL HIGH (ref <150)
VLDL: 31 mg/dL (ref 0–40)

## 2013-04-03 LAB — HEPATIC FUNCTION PANEL
AST: 14 U/L (ref 0–37)
Alkaline Phosphatase: 84 U/L (ref 39–117)
Bilirubin, Direct: 0.1 mg/dL (ref 0.0–0.3)
Total Bilirubin: 0.3 mg/dL (ref 0.3–1.2)

## 2013-04-03 NOTE — Telephone Encounter (Signed)
  Procedure: tcs  Reason/Indication:  Hx polyps  Has patient had this procedure before?  Yes, 2011 (EPIC)  If so, when, by whom and where?    Is there a family history of colon cancer?  Not sure  Who?  What age when diagnosed?    Is patient diabetic?   no      Does patient have prosthetic heart valve?  no  Do you have a pacemaker?  no  Has patient ever had endocarditis? no  Has patient had joint replacement within last 12 months?  no  Does patient tend to be constipated or take laxatives? \  Is patient on Coumadin, Plavix and/or Aspirin? yes  Medications: effexor 37.5 mg bid, amlodipine 5 mg daily, asa 81 mg daily, plavix 75 mg daily, alprazolam 0.5 mg bid, omeprazole daily, pravastatin 20 mg daily, hydrocodone 5/325 mg 1 qid prn  Allergies: see EPIC  Medication Adjustment: plavix 5 days, asa 2 days  Procedure date & time: 05/02/13 at 1030

## 2013-04-04 NOTE — Telephone Encounter (Signed)
agree

## 2013-04-05 ENCOUNTER — Telehealth: Payer: Self-pay | Admitting: *Deleted

## 2013-04-05 MED ORDER — PRAVASTATIN SODIUM 40 MG PO TABS: 40.0000 mg | ORAL_TABLET | Freq: Every evening | ORAL | Status: DC

## 2013-04-05 NOTE — Telephone Encounter (Signed)
Message copied by Lesle Chris on Fri Apr 05, 2013  2:46 PM ------      Message from: Prentice Docker A      Created: Thu Apr 04, 2013  5:00 PM       Increase Pravastatin to 40 mg daily and recheck lipids in 3 months. ------

## 2013-04-05 NOTE — Telephone Encounter (Signed)
Notes Recorded by Lesle Chris, LPN on 16/07/958 at 2:40 PM Wife Kathie Rhodes) notified. Will send new rx to [facility]. Will mail order when time to repeat labs.

## 2013-04-16 ENCOUNTER — Encounter (INDEPENDENT_AMBULATORY_CARE_PROVIDER_SITE_OTHER): Payer: Self-pay | Admitting: *Deleted

## 2013-04-25 ENCOUNTER — Encounter (HOSPITAL_COMMUNITY): Payer: Self-pay | Admitting: Pharmacy Technician

## 2013-05-02 ENCOUNTER — Encounter (HOSPITAL_COMMUNITY)
Admission: RE | Disposition: A | Discharge status: Home / Self Care | Payer: Self-pay | Source: Ambulatory Visit | Attending: Internal Medicine

## 2013-05-02 ENCOUNTER — Ambulatory Visit (HOSPITAL_COMMUNITY)
Admission: RE | Admit: 2013-05-02 | Discharge: 2013-05-02 | Disposition: A | Discharge status: Home / Self Care | Payer: BC Managed Care – PPO | Source: Ambulatory Visit | Attending: Internal Medicine | Admitting: Internal Medicine

## 2013-05-02 ENCOUNTER — Ambulatory Visit: Payer: BC Managed Care – PPO | Admitting: Cardiovascular Disease

## 2013-05-02 ENCOUNTER — Encounter (HOSPITAL_COMMUNITY): Payer: Self-pay

## 2013-05-02 DIAGNOSIS — Z860100 Personal history of colon polyps, unspecified: Secondary | ICD-10-CM | POA: Insufficient documentation

## 2013-05-02 DIAGNOSIS — K644 Residual hemorrhoidal skin tags: Secondary | ICD-10-CM

## 2013-05-02 DIAGNOSIS — D126 Benign neoplasm of colon, unspecified: Secondary | ICD-10-CM

## 2013-05-02 DIAGNOSIS — J4489 Other specified chronic obstructive pulmonary disease: Secondary | ICD-10-CM | POA: Insufficient documentation

## 2013-05-02 DIAGNOSIS — I10 Essential (primary) hypertension: Secondary | ICD-10-CM | POA: Insufficient documentation

## 2013-05-02 DIAGNOSIS — J449 Chronic obstructive pulmonary disease, unspecified: Secondary | ICD-10-CM | POA: Insufficient documentation

## 2013-05-02 HISTORY — PX: COLONOSCOPY: SHX5424

## 2013-05-02 SURGERY — COLONOSCOPY
Anesthesia: Moderate Sedation

## 2013-05-02 MED ORDER — STERILE WATER FOR IRRIGATION IR SOLN
Status: DC | PRN
  Administered 2013-05-02: 10:00:00

## 2013-05-02 MED ORDER — SODIUM CHLORIDE 0.9 % IV SOLN
INTRAVENOUS | Status: DC
  Administered 2013-05-02: 1000 mL via INTRAVENOUS

## 2013-05-02 MED ORDER — MIDAZOLAM HCL 5 MG/5ML IJ SOLN
INTRAMUSCULAR | Status: AC
  Filled 2013-05-02: qty 5

## 2013-05-02 MED ORDER — PROMETHAZINE HCL 25 MG/ML IJ SOLN
INTRAMUSCULAR | Status: AC
  Filled 2013-05-02: qty 1

## 2013-05-02 MED ORDER — MEPERIDINE HCL 50 MG/ML IJ SOLN
INTRAMUSCULAR | Status: DC | PRN
  Administered 2013-05-02 (×2): 25 mg via INTRAVENOUS

## 2013-05-02 MED ORDER — MIDAZOLAM HCL 5 MG/5ML IJ SOLN
INTRAMUSCULAR | Status: DC | PRN
  Administered 2013-05-02: 2 mg via INTRAVENOUS
  Administered 2013-05-02 (×2): 3 mg via INTRAVENOUS
  Administered 2013-05-02 (×2): 2 mg via INTRAVENOUS
  Administered 2013-05-02: 3 mg via INTRAVENOUS

## 2013-05-02 MED ORDER — SODIUM CHLORIDE 0.9 % IJ SOLN
INTRAMUSCULAR | Status: AC
  Filled 2013-05-02: qty 10

## 2013-05-02 MED ORDER — MIDAZOLAM HCL 5 MG/5ML IJ SOLN
INTRAMUSCULAR | Status: AC
  Filled 2013-05-02: qty 10

## 2013-05-02 MED ORDER — PROMETHAZINE HCL 25 MG/ML IJ SOLN
INTRAMUSCULAR | Status: DC | PRN
  Administered 2013-05-02 (×2): 12.5 mg via INTRAVENOUS

## 2013-05-02 MED ORDER — MEPERIDINE HCL 50 MG/ML IJ SOLN
INTRAMUSCULAR | Status: AC
  Filled 2013-05-02: qty 1

## 2013-05-02 NOTE — H&P (Signed)
Roy [name] is an 57 y.o. male.   Chief Complaint: Patient's here for colonoscopy. HPI: Patient is 57 year old Caucasian male with history of colonic adenomas. He's had multiple adenomas removed on prior to colonoscopies. First one was in 2000 and in the last exam was in September 2011. He denies abdominal pain change in his bowel habits or rectal bleeding. Family history is negative for colon carcinoma.  Past Medical History  Diagnosis Date  . Hepatitis C    Hepatitis 1C.  [name] CAD (coronary artery disease)     a. Prior history of BMS to OM and RCA, subsequent cutting balloon angioplasty due to ISR in these distributions, also BMS placement with DES to LAD. b. Cath 01/2013: diffuse 3V CAD without clear culprit lesion, normal LVEF.  [name] Hyperlipidemia   . DDD (degenerative disc disease)   . GERD (gastroesophageal reflux disease)   . Reflux   . Abdominal pain, right upper quadrant   . Depression   . COPD (chronic obstructive pulmonary disease)   . Myocardial infarction 2007 ?  [name] Hypertension   . Kidney stones   . Sinus bradycardia     Past Surgical History  Procedure Laterality Date  . Liver biopsy  03/24/2010  . Hernia repair    . Colonoscopy    . Cardiac catheterization    . Coronary angioplasty with stent placement      Family History  Problem Relation Age of Onset  . Diabetes Mother   . Diabetes Brother   . Heart disease Brother   . Diabetes Brother   . Healthy Brother   . Healthy Son   . Healthy Son    Social History:  reports that he quit smoking about 5 years ago. His smoking use included Cigarettes. He smoked 0.00 packs per day. He has never used smokeless tobacco. He reports that he does not drink alcohol or use illicit drugs.  Allergies:  Allergies  Allergen Reactions  . Codeine Itching  . Food     PEACHES  . Imdur [Isosorbide]     Has not tolerated well in the past  . Penicillins     Medications Prior to Admission  Medication Sig Dispense Refill  .  ALPRAZolam (XANAX) 0.5 MG tablet Take 0.5 mg by mouth 2 (two) times daily as needed. As Needed for Anxiety      . amLODipine (NORVASC) 5 MG tablet Take 5 mg by mouth daily.      [name] aspirin 81 MG tablet Take 81 mg by mouth daily.        . clopidogrel (PLAVIX) 75 MG tablet Take 1 tablet (75 mg total) by mouth daily.  30 tablet  6  . HYDROcodone-acetaminophen (NORCO/VICODIN) 5-325 MG per tablet Take 1 tablet by mouth every 6 (six) hours as needed for pain.      . metoprolol tartrate (LOPRESSOR) 25 MG tablet Take 0.5 tablets (12.5 mg total) by mouth 2 (two) times daily.  30 tablet  6  . omeprazole-sodium bicarbonate (ZEGERID) 40-1100 MG per capsule TAKE 1 CAPSULE BY MOUTH AT BEDTIME.  30 capsule  5  . peg 3350 powder (MOVIPREP) 100 G SOLR Take 1 kit (200 g total) by mouth once.  1 kit  0  . pravastatin (PRAVACHOL) 40 MG tablet Take 1 tablet (40 mg total) by mouth every evening.  30 tablet  6  . venlafaxine (EFFEXOR) 37.5 MG tablet Take 37.5 mg by mouth 2 (two) times daily.      . nitroGLYCERIN (NITROSTAT)  0.4 MG SL tablet Place 1 tablet (0.4 mg total) under the tongue every 5 (five) minutes as needed.  25 tablet  3    No results found for this or any previous visit (from the past 48 hour(s)). No results found.  ROS  Blood pressure 142/108, temperature 97.6 F (36.4 C), temperature source Oral, resp. rate 16, height 5\' 9"  (1.753 m), weight 177 lb (80.287 kg), SpO2 99.00%. Physical Exam  Constitutional: He appears well-developed and well-nourished.  HENT:  Mouth/Throat: Oropharynx is clear and moist.  Eyes: Conjunctivae are normal. No scleral icterus.  Neck: No thyromegaly present.  Cardiovascular: Normal rate, regular rhythm and normal heart sounds.   No murmur heard. Respiratory: Effort normal and breath sounds normal.  GI: Soft. He exhibits no distension and no mass. There is no tenderness.  Musculoskeletal: He exhibits no edema.  Lymphadenopathy:    He has no cervical adenopathy.   Neurological: He is alert.  Skin: Skin is warm and dry.     Assessment/Plan History of multiple colonic adenomas. Surveillance colonoscopy.  Adamary Savary U 05/02/2013, 10:34 AM

## 2013-05-02 NOTE — Op Note (Signed)
COLONOSCOPY PROCEDURE REPORT  PATIENT:  Roy [name]  MR#:  [id number] Birthdate:  25-Jul-1955, 57 y.o., male Endoscopist:  Dr. Malissa Hippo, MD Referred By:  Dr. Kirstie Peri, MD Procedure Date: 05/02/2013  Procedure:   Colonoscopy with snare polypectomy and Hemoclip application.  Indications: Patient is 57 year old Caucasian male with history of colonic adenomas. He has had multiple adenomas removed in 2 prior colonoscopies. First one was in 2010 and the second one is in 2011.  Informed Consent:  The procedure and risks were reviewed with the patient and informed consent was obtained.  Medications:  Demerol 50 mg IV Versed 15 mg IV Promethazine 25 mg IV and diluted form.  Description of procedure:  After a digital rectal exam was performed, that colonoscope was advanced from the anus through the rectum and colon to the area of the cecum, ileocecal valve and appendiceal orifice. The cecum was deeply intubated. These structures were well-seen and photographed for the record. From the level of the cecum and ileocecal valve, the scope was slowly and cautiously withdrawn. The mucosal surfaces were carefully surveyed utilizing scope tip to flexion to facilitate fold flattening as needed. The scope was pulled down into the rectum where a thorough exam including retroflexion was performed.  Findings:   Prep satisfactory. 10 mm broad-based cecal polyp snared and 2 hemoclips applied to polypectomy site. Two small polyps removed from ascending colon and submitted together; one via cold biopsy and the second one was cold snared. Normal rectal mucosa. Small hemorrhoids below the dentate line.   Therapeutic/Diagnostic Maneuvers Performed:  See above  Complications:  None  Cecal Withdrawal Time:  29 nutes  Impression:  Examination performed to cecum. 10 mm polyp snared from cecum and 2 hemoclips applied the polypectomy site. Twoall polyps removed from ascending colon and submitted  together(cold biopsy and cold snare). Small external hemorrhoids.  Recommendations:  Standard instructions given. Patient advised not to undergo MRI antral hemoclips of passed. Patient resume clopidogrel in a.m. I will contact patient with biopsy results and further recommendations.  [name]  05/02/2013 11:37 AM  CC: Dr. Kirstie Peri, MD & Dr. Bonnetta Barry ref. provider found

## 2013-05-03 ENCOUNTER — Encounter (HOSPITAL_COMMUNITY): Payer: Self-pay | Admitting: Internal Medicine

## 2013-05-09 ENCOUNTER — Other Ambulatory Visit: Payer: Self-pay

## 2013-05-09 ENCOUNTER — Encounter (INDEPENDENT_AMBULATORY_CARE_PROVIDER_SITE_OTHER): Payer: Self-pay | Admitting: *Deleted

## 2013-05-13 ENCOUNTER — Encounter: Payer: Self-pay | Admitting: Cardiology

## 2013-05-13 ENCOUNTER — Ambulatory Visit (INDEPENDENT_AMBULATORY_CARE_PROVIDER_SITE_OTHER): Payer: BC Managed Care – PPO | Admitting: Cardiovascular Disease

## 2013-05-13 ENCOUNTER — Encounter: Payer: Self-pay | Admitting: Cardiovascular Disease

## 2013-05-13 VITALS — BP 150/76 | HR 61 | Ht 69.0 in | Wt 177.0 lb

## 2013-05-13 DIAGNOSIS — Z79899 Other long term (current) drug therapy: Secondary | ICD-10-CM

## 2013-05-13 DIAGNOSIS — K759 Inflammatory liver disease, unspecified: Secondary | ICD-10-CM

## 2013-05-13 DIAGNOSIS — I209 Angina pectoris, unspecified: Secondary | ICD-10-CM

## 2013-05-13 DIAGNOSIS — I2510 Atherosclerotic heart disease of native coronary artery without angina pectoris: Secondary | ICD-10-CM

## 2013-05-13 DIAGNOSIS — I10 Essential (primary) hypertension: Secondary | ICD-10-CM

## 2013-05-13 DIAGNOSIS — E785 Hyperlipidemia, unspecified: Secondary | ICD-10-CM

## 2013-05-13 MED ORDER — ATORVASTATIN CALCIUM 40 MG PO TABS: 40.0000 mg | ORAL_TABLET | Freq: Every day | ORAL | Status: DC

## 2013-05-13 MED ORDER — RANOLAZINE ER 500 MG PO TB12
500.0000 mg | ORAL_TABLET | Freq: Two times a day (BID) | ORAL | Status: DC
Start: 2013-05-13 — End: 2013-12-12

## 2013-05-13 MED ORDER — METOPROLOL SUCCINATE ER 25 MG PO TB24: 25.0000 mg | ORAL_TABLET | Freq: Every day | ORAL | Status: DC

## 2013-05-13 MED ORDER — AMLODIPINE BESYLATE 10 MG PO TABS: 10.0000 mg | ORAL_TABLET | Freq: Every day | ORAL | Status: DC

## 2013-05-13 NOTE — Patient Instructions (Addendum)
Your physician recommends that you schedule a follow-up appointment in: 2 months with Dr. Purvis Sheffield. This appointment will be scheduled today before you leave.  Your physician has recommended you make the following change in your medication:  Stop: Metoprolol Start: Toprol XL 25 MG once daily, Start: Ranexa 500 MG twice daily Increase: Norvasc 10 MG daily Stop: Pravachol Start: Atorvastatin 40 MG   Continue all other medications the same.   Your physician recommends that you return for lab work in: 3 months (Around 08-13-12) for Fasting liver and lipid.   You can go to the following locations to get lab work done: [facility] [address] Dr. Lysbeth Galas office in [place] [facility].

## 2013-05-13 NOTE — Progress Notes (Signed)
Patient ID: Roy [name], male   DOB: 1956/02/03, 57 y.o.   MRN: [id number]      SUBJECTIVE: Roy [name] has a h/o CAD with stents and recent cardiac catheterization which did not identify a culprit lesion, albeit he had diffuse 3-vessel disease. He has not tolerated Imdur in the past.   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   He had chest pain when I last evaluated him, which lasted 15 minutes, but he laid down and it subsided. He did not take a SL nitro, but he wanted to see if it would resolve spontaneously. The pain radiated into the neck and jaw.   The though after cardiac cath was either to obtain a nuclear myocardial perfusion study to localize the area of ischemia vs FFR of the mid-LAD lesion.   I started low-dose metoprolol at his last visit primarily for anginal relief.  He is supposed to be on Norvasc 10 mg daily as per Roy [name]'s med adjustment, but he is only on 5 mg daily.  He had no chest pain since his last visit with me until this morning. While he was getting ready to go to work, he had substernal burning chest pain which was relieved after 2 SL nitro tablets.  He did say that he thinks it's due to both home and work related stress.  ECG shows normal sinus rhythm, 71 bpm, with no diagnostic ECG changes.    Allergies  Allergen Reactions  . Codeine Itching  . Food     PEACHES  . Imdur [Isosorbide]     Has not tolerated well in the past  . Penicillins     Current Outpatient Prescriptions  Medication Sig Dispense Refill  . ALPRAZolam (XANAX) 0.5 MG tablet Take 0.5 mg by mouth 2 (two) times daily as needed. As Needed for Anxiety      . amLODipine (NORVASC) 5 MG tablet Take 5 mg by mouth daily.      [name] aspirin 81 MG tablet Take 81 mg by mouth daily.        . clopidogrel (PLAVIX) 75 MG tablet Take 1 tablet (75 mg total) by mouth daily.  30 tablet  6  .  HYDROcodone-acetaminophen (NORCO/VICODIN) 5-325 MG per tablet Take 1 tablet by mouth every 6 (six) hours as needed for pain.      . metoprolol tartrate (LOPRESSOR) 25 MG tablet Take 0.5 tablets (12.5 mg total) by mouth 2 (two) times daily.  30 tablet  6  . nitroGLYCERIN (NITROSTAT) 0.4 MG SL tablet Place 1 tablet (0.4 mg total) under the tongue every 5 (five) minutes as needed.  25 tablet  3  . omeprazole-sodium bicarbonate (ZEGERID) 40-1100 MG per capsule TAKE 1 CAPSULE BY MOUTH AT BEDTIME.  30 capsule  5  . pravastatin (PRAVACHOL) 40 MG tablet Take 1 tablet (40 mg total) by mouth every evening.  30 tablet  6  . venlafaxine (EFFEXOR) 37.5 MG tablet Take 37.5 mg by mouth 2 (two) times daily.       No current facility-administered medications for this visit.    Past Medical History  Diagnosis Date  . Hepatitis C    Hepatitis 1C.  [name] CAD (coronary artery disease)     a. Prior history of BMS to OM and RCA, subsequent cutting balloon angioplasty due to ISR in these distributions, also BMS placement with DES  to LAD. b. Cath 01/2013: diffuse 3V CAD without clear culprit lesion, normal LVEF.  [name] Hyperlipidemia   . DDD (degenerative disc disease)   . GERD (gastroesophageal reflux disease)   . Reflux   . Abdominal pain, right upper quadrant   . Depression   . COPD (chronic obstructive pulmonary disease)   . Myocardial infarction 2007 ?  [name] Hypertension   . Kidney stones   . Sinus bradycardia     Past Surgical History  Procedure Laterality Date  . Liver biopsy  03/24/2010  . Hernia repair    . Colonoscopy    . Cardiac catheterization    . Coronary angioplasty with stent placement    . Colonoscopy N/A 05/02/2013    Procedure: COLONOSCOPY;  Surgeon: Malissa Hippo, MD;  Location: [facility];  Service: Endoscopy;  Laterality: N/A;  1030-moved to 1055 Ann to notify pt    History   Social History  . Marital Status: Married    Spouse Name: N/A    Number of Children: N/A  . Years of  Education: N/A   Occupational History  . Not on file.   Social History Main Topics  . Smoking status: Former Smoker    Types: Cigarettes    Quit date: 02/28/2008  . Smokeless tobacco: Never Used  . Alcohol Use: No  . Drug Use: No  . Sexual Activity: Not on file   Other Topics Concern  . Not on file   Social History Narrative  . No narrative on file     Filed Vitals:   05/13/13 1117  Height: 5\' 9"  (1.753 m)  Weight: 177 lb (80.287 kg)    BP 150/76 Pulse 61    PHYSICAL EXAM General: NAD, poor dentition Neck: No JVD, no thyromegaly or thyroid nodule.  Lungs: Clear to auscultation bilaterally with normal respiratory effort. CV: Nondisplaced PMI.  Heart regular S1/S2, no S3/S4, no murmur.  No peripheral edema.  No carotid bruit.  Normal pedal pulses.  Abdomen: Soft, nontender, no hepatosplenomegaly, no distention.  Neurologic: Alert and oriented x 3.  Psych: Normal affect. Extremities: No clubbing or cyanosis.   ECG: reviewed and available in electronic records.      ASSESSMENT AND PLAN: 1. CAD: given that he's having recurrences of angina, I will initiate ranolazine 500 mg bid. I will also increase amlodipine to 10 mg daily, both for anginal relief and for more optimal BP control. He's having difficulty splitting the metoprolol tartrate tablets, so I will switch it to Toprol-XL 25 mg daily. After optimal medical therapy, if he still has angina, I would consider fractional flow reserve of the LAD to determine the hemodynamic significance of the lesion vs nuclear myocardial perfusion imaging to locate the area of ischemia.  2. HTN: mildly elevated today, thus I will increase amlodipine to 10 mg daily.  3. Hyperlipidemia: lipids on 04/04/13 showed marked elevation with LDL 143, HDL 34, TC 208, and TG 156. LFT's showed AST 14 and ALT 41. He does have hepatitis C. Given the overall benefit of atorvastatin, not just with respect to lipids but due to its pleiotropic effects as  well, I will switch pravastatin to atorvastatin 40 mg daily, and recheck both lipids and LFT's in 3 months.  Dispo: f/u 2 months.   Prentice Docker, M.D., F.A.C.C.

## 2013-05-21 ENCOUNTER — Telehealth: Payer: Self-pay | Admitting: Cardiovascular Disease

## 2013-05-21 NOTE — Telephone Encounter (Signed)
Pt wife called and said disregard call she had everything figured out.

## 2013-05-21 NOTE — Telephone Encounter (Signed)
Roy [name] called wanting to know about the changes in patient's medications. States that pharmacy states they have No updated changes with his medicine.

## 2013-05-29 ENCOUNTER — Other Ambulatory Visit: Payer: Self-pay | Admitting: Cardiology

## 2013-06-10 ENCOUNTER — Encounter: Payer: Self-pay | Admitting: *Deleted

## 2013-07-23 ENCOUNTER — Ambulatory Visit (INDEPENDENT_AMBULATORY_CARE_PROVIDER_SITE_OTHER): Payer: BC Managed Care – PPO | Admitting: Cardiovascular Disease

## 2013-07-23 ENCOUNTER — Encounter: Payer: Self-pay | Admitting: Cardiovascular Disease

## 2013-07-23 VITALS — BP 152/86 | HR 68 | Ht 69.0 in | Wt 188.0 lb

## 2013-07-23 DIAGNOSIS — I209 Angina pectoris, unspecified: Secondary | ICD-10-CM

## 2013-07-23 DIAGNOSIS — E785 Hyperlipidemia, unspecified: Secondary | ICD-10-CM

## 2013-07-23 DIAGNOSIS — I2510 Atherosclerotic heart disease of native coronary artery without angina pectoris: Secondary | ICD-10-CM

## 2013-07-23 DIAGNOSIS — I10 Essential (primary) hypertension: Secondary | ICD-10-CM

## 2013-07-23 MED ORDER — LISINOPRIL 5 MG PO TABS: 5.0000 mg | ORAL_TABLET | Freq: Every day | ORAL | Status: DC

## 2013-07-23 NOTE — Patient Instructions (Signed)
Your physician recommends that you schedule a follow-up appointment in: 3 months. You will receive a reminder letter in the mail in about 1 month reminding you to call and schedule your appointment. If you don't receive this letter, please contact our office. Your physician has recommended you make the following change in your medication: Start lisinopril 5 mg daily. Your new prescription has been sent to your pharmacy. All other medications will remain the same. Your physician recommends that you have lab work in 1 week around July 30, 2013 to check your BMET.

## 2013-07-23 NOTE — Progress Notes (Signed)
Patient ID: Roy [name], male   DOB: 1955-08-12, 58 y.o.   MRN: [id number]      SUBJECTIVE: Roy [name] has a h/o CAD with stents and while cardiac catheterization did not identify a culprit lesion, he was noted to have diffuse 3-vessel disease. He has not tolerated Imdur in the past.  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  The though after cardiac cath was either to obtain a nuclear myocardial perfusion study to localize the area of ischemia vs FFR of the mid-LAD lesion.   He was reportedly hospitalized in December 2014 for chest pain at [facility], deemed secondary to chemical pneumonitis. He ruled out for an acute coronary syndrome. He has not been experiencing any chest pain since that time and has not had to use any sublingual nitroglycerin. He is under a lot of stress at work. Workmen's Comp. denied his claim and that his upset him considerably.     Allergies  Allergen Reactions  . Codeine Itching  . Food     PEACHES  . Imdur [Isosorbide]     Has not tolerated well in the past  . Penicillins     Current Outpatient Prescriptions  Medication Sig Dispense Refill  . ALPRAZolam (XANAX) 0.5 MG tablet Take 0.5 mg by mouth 2 (two) times daily as needed. As Needed for Anxiety      . amLODipine (NORVASC) 10 MG tablet Take 1 tablet (10 mg total) by mouth daily.  30 tablet  6  . aspirin 81 MG tablet Take 81 mg by mouth daily.        [name] atorvastatin (LIPITOR) 40 MG tablet Take 1 tablet (40 mg total) by mouth daily.  30 tablet  6  . clopidogrel (PLAVIX) 75 MG tablet TAKE (1) TABLET BY MOUTH ONCE DAILY.  30 tablet  6  . HYDROcodone-acetaminophen (NORCO/VICODIN) 5-325 MG per tablet Take 1 tablet by mouth every 6 (six) hours as needed for pain.      [name] ipratropium-albuterol (DUONEB) 0.5-2.5 (3) MG/3ML SOLN Inhale 3 mLs into the lungs every 4 (four) hours as needed.      . metoprolol succinate (TOPROL  XL) 25 MG 24 hr tablet Take 1 tablet (25 mg total) by mouth daily.  30 tablet  6  . nitroGLYCERIN (NITROSTAT) 0.4 MG SL tablet Place 1 tablet (0.4 mg total) under the tongue every 5 (five) minutes as needed.  25 tablet  3  . omeprazole-sodium bicarbonate (ZEGERID) 40-1100 MG per capsule TAKE 1 CAPSULE BY MOUTH AT BEDTIME.  30 capsule  5  . ranolazine (RANEXA) 500 MG 12 hr tablet Take 1 tablet (500 mg total) by mouth 2 (two) times daily.  60 tablet  6  . venlafaxine (EFFEXOR) 37.5 MG tablet Take 37.5 mg by mouth 2 (two) times daily.       No current facility-administered medications for this visit.    Past Medical History  Diagnosis Date  . Hepatitis C    Hepatitis 1C.  [name] CAD (coronary artery disease)     a. Prior history of BMS to OM and RCA, subsequent cutting balloon angioplasty due to ISR in these distributions, also BMS placement with DES to LAD. b. Cath 01/2013: diffuse 3V CAD without clear culprit lesion, normal LVEF.  [name] Hyperlipidemia   . DDD (degenerative disc disease)   . GERD (gastroesophageal reflux disease)   .  Reflux   . Abdominal pain, right upper quadrant   . Depression   . COPD (chronic obstructive pulmonary disease)   . Myocardial infarction 2007 ?  [name] Hypertension   . Kidney stones   . Sinus bradycardia     Past Surgical History  Procedure Laterality Date  . Liver biopsy  03/24/2010  . Hernia repair    . Colonoscopy    . Cardiac catheterization    . Coronary angioplasty with stent placement    . Colonoscopy N/A 05/02/2013    Procedure: COLONOSCOPY;  Surgeon: Rogene Houston, MD;  Location: [facility];  Service: Endoscopy;  Laterality: N/A;  1030-moved to 1055 Ann to notify pt    History   Social History  . Marital Status: Married    Spouse Name: N/A    Number of Children: N/A  . Years of Education: N/A   Occupational History  . Not on file.   Social History Main Topics  . Smoking status: Former Smoker    Types: Cigarettes    Quit date:  02/28/2008  . Smokeless tobacco: Never Used  . Alcohol Use: No  . Drug Use: No  . Sexual Activity: Not on file   Other Topics Concern  . Not on file   Social History Narrative  . No narrative on file     Filed Vitals:   07/23/13 0853  BP: 152/86  Pulse: 68  Height: 5\' 9"  (1.753 m)  Weight: 188 lb (85.276 kg)  SpO2: 99%    PHYSICAL EXAM General: NAD Neck: No JVD, no thyromegaly or thyroid nodule.  Lungs: Clear to auscultation bilaterally with normal respiratory effort. CV: Nondisplaced PMI.  Heart regular S1/S2, no S3/S4, no murmur.  No peripheral edema.  No carotid bruit.  Normal pedal pulses.  Abdomen: Soft, nontender, no hepatosplenomegaly, no distention.  Neurologic: Alert and oriented x 3.  Psych: Normal affect. Extremities: No clubbing or cyanosis.   ECG: reviewed and available in electronic records.      ASSESSMENT AND PLAN: 1. CAD: symptomatically stable. Continue ranolazine 500 mg bid along withToprol-XL 25 mg daily. After optimal medical therapy, if he still has angina, I would consider fractional flow reserve of the LAD to determine the hemodynamic significance of the lesion vs nuclear myocardial perfusion imaging to locate the area of ischemia.  2. HTN: mildly elevated today. Start lisinopril 5 mg daily and check BMET to assess renal function in one week. 3. Hyperlipidemia: lipids on 06/03/13 showed improvement with LDL 143-->84, HDL 34-->38, TC 208-->145, and TG 156-->117. LFT's showed AST 11 and ALT 30. He does have hepatitis C. Continue atorvastatin 40 mg daily.  Dispo: f/u 3 months.    Kate Sable, M.D., F.A.C.C.

## 2013-07-29 ENCOUNTER — Other Ambulatory Visit (INDEPENDENT_AMBULATORY_CARE_PROVIDER_SITE_OTHER): Payer: Self-pay | Admitting: Internal Medicine

## 2013-08-01 ENCOUNTER — Other Ambulatory Visit (INDEPENDENT_AMBULATORY_CARE_PROVIDER_SITE_OTHER): Payer: Self-pay | Admitting: Internal Medicine

## 2013-08-06 ENCOUNTER — Telehealth: Payer: Self-pay | Admitting: *Deleted

## 2013-08-06 NOTE — Telephone Encounter (Signed)
Patient's wife informed

## 2013-08-06 NOTE — Telephone Encounter (Signed)
Message copied by Merlene Laughter on Tue Aug 06, 2013  2:23 PM ------      Message from: Kate Sable A      Created: Tue Aug 06, 2013  1:03 PM       Normal. ------

## 2013-09-25 ENCOUNTER — Encounter (INDEPENDENT_AMBULATORY_CARE_PROVIDER_SITE_OTHER): Payer: Self-pay | Admitting: *Deleted

## 2013-10-21 ENCOUNTER — Ambulatory Visit: Payer: BC Managed Care – PPO | Admitting: Cardiovascular Disease

## 2013-11-18 ENCOUNTER — Encounter (INDEPENDENT_AMBULATORY_CARE_PROVIDER_SITE_OTHER): Payer: Self-pay | Admitting: Internal Medicine

## 2013-11-18 ENCOUNTER — Other Ambulatory Visit: Payer: Self-pay | Admitting: Cardiovascular Disease

## 2013-11-18 ENCOUNTER — Ambulatory Visit (INDEPENDENT_AMBULATORY_CARE_PROVIDER_SITE_OTHER): Payer: BC Managed Care – PPO | Admitting: Internal Medicine

## 2013-11-18 VITALS — BP 112/60 | HR 64 | Temp 98.0°F | Ht 69.0 in | Wt 191.9 lb

## 2013-11-18 DIAGNOSIS — E039 Hypothyroidism, unspecified: Secondary | ICD-10-CM

## 2013-11-18 DIAGNOSIS — B1920 Unspecified viral hepatitis C without hepatic coma: Secondary | ICD-10-CM

## 2013-11-18 LAB — HEPATIC FUNCTION PANEL
ALBUMIN: 4.5 g/dL (ref 3.5–5.2)
ALT: 14 U/L (ref 0–53)
AST: 11 U/L (ref 0–37)
Alkaline Phosphatase: 90 U/L (ref 39–117)
BILIRUBIN DIRECT: 0.1 mg/dL (ref 0.0–0.3)
BILIRUBIN TOTAL: 0.6 mg/dL (ref 0.2–1.2)
Indirect Bilirubin: 0.5 mg/dL (ref 0.2–1.2)
Total Protein: 7.7 g/dL (ref 6.0–8.3)

## 2013-11-18 NOTE — Progress Notes (Signed)
Subjective:     Patient ID: Roy [name], male   DOB: [birth year], 58 y.o.   MRN: [id number]  HPI Here today for f/u of his Hepatitis C. He is gneotype 1, stage 11 disease and was treated with three drugs for 6 months with SVR. (Pegasys 121mcg, Ribavirin, and Telaprevir). He finished therapy in 2012. HCV RNA was negative in November of 2013. Hx of colonic polyps. Last colonoscopy was in 2014: Biopsy: Cecal polyp is sessile serrated adenoma. Ascending colon polyps are tubular adenomas. Next colonoscopy in 5 years He tells me he is doing. 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  Energy level is okay.  Appetite is good. No weight loss. No abdominal pain.  He usually has a BM daily.  06/22/2013 ALP 79, AST 11, Total bili 0.9, ALT 30.      Review of Systems see hpi Past Medical History  Diagnosis Date  . Hepatitis C    Hepatitis 1C.  [name] CAD (coronary artery disease)     a. Prior history of BMS to OM and RCA, subsequent cutting balloon angioplasty due to ISR in these distributions, also BMS placement with DES to LAD. b. Cath 01/2013: diffuse 3V CAD without clear culprit lesion, normal LVEF.  [name] Hyperlipidemia   . DDD (degenerative disc disease)   . GERD (gastroesophageal reflux disease)   . Reflux   . Abdominal pain, right upper quadrant   . Depression   . COPD (chronic obstructive pulmonary disease)   . Myocardial infarction 2007 ?  [name] Hypertension   . Kidney stones   . Sinus bradycardia     Past Surgical History  Procedure Laterality Date  . Liver biopsy  03/24/2010  . Hernia repair    . Colonoscopy    . Cardiac catheterization    . Coronary angioplasty with stent placement    . Colonoscopy N/A 05/02/2013    Procedure: COLONOSCOPY;  Surgeon: Rogene Houston, MD;  Location: [facility];  Service: Endoscopy;  Laterality: N/A;  1030-moved to 1055 Ann to notify pt    Allergies  Allergen Reactions  . Codeine Itching  . Food     PEACHES  . Imdur [Isosorbide]     Has not  tolerated well in the past  . Penicillins     Current Outpatient Prescriptions on File Prior to Visit  Medication Sig Dispense Refill  . amLODipine (NORVASC) 10 MG tablet Take 1 tablet (10 mg total) by mouth daily.  30 tablet  6  . aspirin 81 MG tablet Take 81 mg by mouth daily.        [name] atorvastatin (LIPITOR) 40 MG tablet Take 1 tablet (40 mg total) by mouth daily.  30 tablet  6  . clopidogrel (PLAVIX) 75 MG tablet TAKE (1) TABLET BY MOUTH ONCE DAILY.  30 tablet  6  . ipratropium-albuterol (DUONEB) 0.5-2.5 (3) MG/3ML SOLN Inhale 3 mLs into the lungs every 4 (four) hours as needed.      [name] lisinopril (PRINIVIL,ZESTRIL) 5 MG tablet Take 1 tablet (5 mg total) by mouth daily.  90 tablet  3  . nitroGLYCERIN (NITROSTAT) 0.4 MG SL tablet Place 1 tablet (0.4 mg total) under the tongue every 5 (five) minutes as needed.  25 tablet  3  . omeprazole-sodium bicarbonate (ZEGERID) 40-1100 MG per capsule TAKE 1 CAPSULE BY MOUTH AT BEDTIME.  30 capsule  5  . ranolazine (RANEXA) 500 MG 12 hr tablet Take 1 tablet (500 mg total) by mouth 2 (two)  times daily.  60 tablet  6  . venlafaxine (EFFEXOR) 37.5 MG tablet Take 37.5 mg by mouth 2 (two) times daily.       No current facility-administered medications on file prior to visit.         Objective:   Physical Exam  Filed Vitals:   11/18/13 0910  BP: 112/60  Pulse: 64  Temp: 98 F (36.7 C)  Height: 5\' 9"  (1.753 m)  Weight: 191 lb 14.4 oz (87.045 kg)   Alert and oriented. Skin warm and dry. Oral mucosa is moist.   . Sclera anicteric, conjunctivae is pink. Thyroid not enlarged. No cervical lymphadenopathy. Lungs clear. Heart regular rate and rhythm.  Abdomen is soft. Bowel sounds are positive. No hepatomegaly. No abdominal masses felt. No tenderness. Abdomen obese  No edema to lower extremities.     Assessment:    Hepatitis C. Last LFTs were normal in December.  Colonic polyps. Next colonoscopy in 5 yrs.     Plan:    OV in 1 yr. If any problems,  please call our office. Hepatic panel, Hep C quaint today

## 2013-11-18 NOTE — Patient Instructions (Signed)
Labs today. OV in 1 yr.  

## 2013-11-19 ENCOUNTER — Other Ambulatory Visit: Payer: Self-pay | Admitting: *Deleted

## 2013-11-19 LAB — HEPATITIS C RNA QUANTITATIVE: HCV QUANT: NOT DETECTED [IU]/mL (ref <15)

## 2013-11-27 ENCOUNTER — Ambulatory Visit (INDEPENDENT_AMBULATORY_CARE_PROVIDER_SITE_OTHER): Payer: BC Managed Care – PPO | Admitting: Cardiovascular Disease

## 2013-11-27 ENCOUNTER — Encounter: Payer: Self-pay | Admitting: *Deleted

## 2013-11-27 ENCOUNTER — Telehealth: Payer: Self-pay | Admitting: Cardiovascular Disease

## 2013-11-27 ENCOUNTER — Other Ambulatory Visit: Payer: Self-pay | Admitting: Cardiovascular Disease

## 2013-11-27 ENCOUNTER — Encounter: Payer: Self-pay | Admitting: Cardiovascular Disease

## 2013-11-27 VITALS — BP 123/86 | HR 96 | Ht 69.0 in | Wt 187.0 lb

## 2013-11-27 DIAGNOSIS — I2510 Atherosclerotic heart disease of native coronary artery without angina pectoris: Secondary | ICD-10-CM

## 2013-11-27 DIAGNOSIS — R5381 Other malaise: Secondary | ICD-10-CM

## 2013-11-27 DIAGNOSIS — R5383 Other fatigue: Secondary | ICD-10-CM

## 2013-11-27 DIAGNOSIS — E785 Hyperlipidemia, unspecified: Secondary | ICD-10-CM

## 2013-11-27 DIAGNOSIS — I200 Unstable angina: Secondary | ICD-10-CM

## 2013-11-27 DIAGNOSIS — I10 Essential (primary) hypertension: Secondary | ICD-10-CM

## 2013-11-27 DIAGNOSIS — K759 Inflammatory liver disease, unspecified: Secondary | ICD-10-CM

## 2013-11-27 NOTE — Telephone Encounter (Signed)
Pt has [place].  No precert required.

## 2013-11-27 NOTE — Patient Instructions (Signed)
Your physician has requested that you have a cardiac catheterization. Cardiac catheterization is used to diagnose and/or treat various heart conditions. Doctors may recommend this procedure for a number of different reasons. The most common reason is to evaluate chest pain. Chest pain can be a symptom of coronary artery disease (CAD), and cardiac catheterization can show whether plaque is narrowing or blocking your heart's arteries. This procedure is also used to evaluate the valves, as well as measure the blood flow and oxygen levels in different parts of your heart. For further information please visit [email or web address]. Please follow instruction sheet, as given. Continue all current medications. Follow up will be after procedure above

## 2013-11-27 NOTE — Telephone Encounter (Signed)
Left heart cath - Thursday, 5/28 - 12:00 - Irish Lack  Checking percert

## 2013-11-27 NOTE — Progress Notes (Signed)
Patient ID: Roy [name], male   DOB: 06/04/1956, 58 y.o.   MRN: [id number]      SUBJECTIVE: Roy [name] has a h/o CAD with stents and hepatitis C. While cardiac catheterization did not identify a culprit lesion, he was noted to have diffuse 3-vessel disease. He has not tolerated Imdur in the past.  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  The though after cardiac cath was either to obtain a nuclear myocardial perfusion study to localize the area of ischemia vs FFR of the mid-LAD lesion.   He was reportedly hospitalized in December 2014 for chest pain at [facility], deemed secondary to chemical pneumonitis. He ruled out for an acute coronary syndrome.   Since his last visit with me a few months ago, he has been having increased fatigue and has been lying around in the bed more. He says, "This is not like me". He has had less strength when performing his duties at work as well. He has had chest pain radiating into the base of his neck and his jaw. He has also had more shortness of breath.     Allergies  Allergen Reactions  . Codeine Itching  . Food     PEACHES  . Imdur [Isosorbide]     Has not tolerated well in the past  . Penicillins     Current Outpatient Prescriptions  Medication Sig Dispense Refill  . amLODipine (NORVASC) 10 MG tablet Take 1 tablet (10 mg total) by mouth daily.  30 tablet  6  . aspirin 81 MG tablet Take 81 mg by mouth daily.        [name] atorvastatin (LIPITOR) 40 MG tablet Take 1 tablet (40 mg total) by mouth daily.  30 tablet  6  . clopidogrel (PLAVIX) 75 MG tablet TAKE (1) TABLET BY MOUTH ONCE DAILY.  30 tablet  6  . furosemide (LASIX) 20 MG tablet Take 20 mg by mouth.      [name] ipratropium-albuterol (DUONEB) 0.5-2.5 (3) MG/3ML SOLN Inhale 3 mLs into the lungs every 4 (four) hours as needed.      [name] levothyroxine (SYNTHROID, LEVOTHROID) 25 MCG tablet Take 25 mcg by mouth daily  before breakfast.      . lisinopril (PRINIVIL,ZESTRIL) 5 MG tablet Take 1 tablet (5 mg total) by mouth daily.  90 tablet  3  . metoprolol succinate (TOPROL-XL) 25 MG 24 hr tablet TAKE 1 TABLET ONCE DAILY.  30 tablet  3  . nitroGLYCERIN (NITROSTAT) 0.4 MG SL tablet Place 1 tablet (0.4 mg total) under the tongue every 5 (five) minutes as needed.  25 tablet  3  . omeprazole-sodium bicarbonate (ZEGERID) 40-1100 MG per capsule TAKE 1 CAPSULE BY MOUTH AT BEDTIME.  30 capsule  5  . potassium chloride (K-DUR) 10 MEQ tablet Take 10 mEq by mouth daily.      . ranolazine (RANEXA) 500 MG 12 hr tablet Take 1 tablet (500 mg total) by mouth 2 (two) times daily.  60 tablet  6  . tiZANidine (ZANAFLEX) 4 MG capsule Take 4 mg by mouth at bedtime.       . traMADol (ULTRAM) 50 MG tablet Take 50 mg by mouth 3 (three) times daily.       [name] venlafaxine (EFFEXOR) 75 MG tablet Take 1 tablet by mouth 2 (two) times daily.       No current  facility-administered medications for this visit.    Past Medical History  Diagnosis Date  . Hepatitis C    Hepatitis 1C.  [name] CAD (coronary artery disease)     a. Prior history of BMS to OM and RCA, subsequent cutting balloon angioplasty due to ISR in these distributions, also BMS placement with DES to LAD. b. Cath 01/2013: diffuse 3V CAD without clear culprit lesion, normal LVEF.  [name] Hyperlipidemia   . DDD (degenerative disc disease)   . GERD (gastroesophageal reflux disease)   . Reflux   . Abdominal pain, right upper quadrant   . Depression   . COPD (chronic obstructive pulmonary disease)   . Myocardial infarction 2007 ?  [name] Hypertension   . Kidney stones   . Sinus bradycardia     Past Surgical History  Procedure Laterality Date  . Liver biopsy  03/24/2010  . Hernia repair    . Colonoscopy    . Cardiac catheterization    . Coronary angioplasty with stent placement    . Colonoscopy N/A 05/02/2013    Procedure: COLONOSCOPY;  Surgeon: Rogene Houston, MD;  Location: [facility];  Service: Endoscopy;  Laterality: N/A;  1030-moved to 1055 Ann to notify pt    History   Social History  . Marital Status: Married    Spouse Name: N/A    Number of Children: N/A  . Years of Education: N/A   Occupational History  . Not on file.   Social History Main Topics  . Smoking status: Former Smoker    Types: Cigarettes    Quit date: 02/28/2008  . Smokeless tobacco: Never Used  . Alcohol Use: No  . Drug Use: No  . Sexual Activity: Not on file   Other Topics Concern  . Not on file   Social History Narrative  . No narrative on file     Filed Vitals:   11/27/13 1037  BP: 123/86  Pulse: 96  Height: 5\' 9"  (1.753 m)  Weight: 187 lb (84.823 kg)    PHYSICAL EXAM General: NAD Neck: No JVD, no thyromegaly. Lungs: Clear to auscultation bilaterally with normal respiratory effort. CV: Nondisplaced PMI.  Regular rate and rhythm, normal S1/S2, no S3/S4, no murmur. No pretibial or periankle edema.  No carotid bruit.  Normal pedal pulses.  Abdomen: Soft, nontender, no hepatosplenomegaly, no distention.  Neurologic: Alert and oriented x 3.  Psych: Normal affect. Extremities: No clubbing or cyanosis.   ECG: reviewed and available in electronic records.      ASSESSMENT AND PLAN: 1. CAD: Given his new symptoms of increasing chest pain, fatigue, and shortness of breath, I will proceed with coronary angiography with FFR of the mid-LAD lesion to determine its hemodynamic significance. Continue ranolazine 500 mg bid along withToprol-XL 25 mg daily.  2. HTN: Well controlled with the addition of lisinopril 5 mg daily at his last visit with me. 3. Hyperlipidemia: Lipids on 06/03/13 showed improvement with LDL 143-->84, HDL 34-->38, TC 208-->145, and TG 156-->117. LFT's on 11/18/13 showed AST 11 and ALT 14. He does have hepatitis C. Continue atorvastatin 40 mg daily.   Dispo: f/u after cath.  Kate Sable, M.D., F.A.C.C.

## 2013-11-28 ENCOUNTER — Encounter (HOSPITAL_COMMUNITY)
Admission: RE | Disposition: A | Discharge status: Home / Self Care | Payer: Self-pay | Source: Ambulatory Visit | Attending: Interventional Cardiology

## 2013-11-28 ENCOUNTER — Other Ambulatory Visit: Payer: Self-pay

## 2013-11-28 ENCOUNTER — Ambulatory Visit (HOSPITAL_COMMUNITY)
Admission: RE | Admit: 2013-11-28 | Discharge: 2013-11-29 | Disposition: A | Discharge status: Home / Self Care | Payer: BC Managed Care – PPO | Source: Ambulatory Visit | Attending: Interventional Cardiology | Admitting: Interventional Cardiology

## 2013-11-28 ENCOUNTER — Encounter (HOSPITAL_COMMUNITY): Payer: Self-pay | Admitting: Interventional Cardiology

## 2013-11-28 DIAGNOSIS — I10 Essential (primary) hypertension: Secondary | ICD-10-CM | POA: Diagnosis present

## 2013-11-28 DIAGNOSIS — Z7982 Long term (current) use of aspirin: Secondary | ICD-10-CM | POA: Insufficient documentation

## 2013-11-28 DIAGNOSIS — Z87891 Personal history of nicotine dependence: Secondary | ICD-10-CM | POA: Insufficient documentation

## 2013-11-28 DIAGNOSIS — Z7902 Long term (current) use of antithrombotics/antiplatelets: Secondary | ICD-10-CM | POA: Insufficient documentation

## 2013-11-28 DIAGNOSIS — J449 Chronic obstructive pulmonary disease, unspecified: Secondary | ICD-10-CM | POA: Insufficient documentation

## 2013-11-28 DIAGNOSIS — F3289 Other specified depressive episodes: Secondary | ICD-10-CM | POA: Insufficient documentation

## 2013-11-28 DIAGNOSIS — Z955 Presence of coronary angioplasty implant and graft: Secondary | ICD-10-CM

## 2013-11-28 DIAGNOSIS — I2510 Atherosclerotic heart disease of native coronary artery without angina pectoris: Secondary | ICD-10-CM | POA: Diagnosis present

## 2013-11-28 DIAGNOSIS — I209 Angina pectoris, unspecified: Secondary | ICD-10-CM | POA: Diagnosis present

## 2013-11-28 DIAGNOSIS — K219 Gastro-esophageal reflux disease without esophagitis: Secondary | ICD-10-CM | POA: Insufficient documentation

## 2013-11-28 DIAGNOSIS — I498 Other specified cardiac arrhythmias: Secondary | ICD-10-CM | POA: Diagnosis present

## 2013-11-28 DIAGNOSIS — I200 Unstable angina: Secondary | ICD-10-CM

## 2013-11-28 DIAGNOSIS — I252 Old myocardial infarction: Secondary | ICD-10-CM | POA: Insufficient documentation

## 2013-11-28 DIAGNOSIS — B1920 Unspecified viral hepatitis C without hepatic coma: Secondary | ICD-10-CM | POA: Insufficient documentation

## 2013-11-28 DIAGNOSIS — Z9861 Coronary angioplasty status: Secondary | ICD-10-CM | POA: Insufficient documentation

## 2013-11-28 DIAGNOSIS — E782 Mixed hyperlipidemia: Secondary | ICD-10-CM | POA: Diagnosis present

## 2013-11-28 DIAGNOSIS — F329 Major depressive disorder, single episode, unspecified: Secondary | ICD-10-CM | POA: Insufficient documentation

## 2013-11-28 DIAGNOSIS — E039 Hypothyroidism, unspecified: Secondary | ICD-10-CM | POA: Diagnosis present

## 2013-11-28 DIAGNOSIS — J4489 Other specified chronic obstructive pulmonary disease: Secondary | ICD-10-CM | POA: Insufficient documentation

## 2013-11-28 HISTORY — DX: Low back pain: M54.5

## 2013-11-28 HISTORY — DX: Other chronic pain: G89.29

## 2013-11-28 HISTORY — DX: Angina pectoris, unspecified: I20.9

## 2013-11-28 HISTORY — DX: Unspecified osteoarthritis, unspecified site: M19.90

## 2013-11-28 HISTORY — DX: Low back pain, unspecified: M54.50

## 2013-11-28 HISTORY — PX: LEFT HEART CATHETERIZATION WITH CORONARY ANGIOGRAM: SHX5451

## 2013-11-28 HISTORY — DX: Unspecified macular degeneration: H35.30

## 2013-11-28 HISTORY — DX: Blindness, one eye, unspecified eye: H54.40

## 2013-11-28 HISTORY — DX: Unspecified viral hepatitis C without hepatic coma: B19.20

## 2013-11-28 HISTORY — DX: Personal history of other medical treatment: Z92.89

## 2013-11-28 LAB — BASIC METABOLIC PANEL
BUN: 17 mg/dL (ref 6–23)
CALCIUM: 9.2 mg/dL (ref 8.4–10.5)
CO2: 28 meq/L (ref 19–32)
CREATININE: 1.06 mg/dL (ref 0.50–1.35)
Chloride: 101 mEq/L (ref 96–112)
GFR calc Af Amer: 88 mL/min — ABNORMAL LOW (ref >90)
GFR calc non Af Amer: 76 mL/min — ABNORMAL LOW (ref >90)
Glucose, Bld: 110 mg/dL — ABNORMAL HIGH (ref 70–99)
Potassium: 4.2 mEq/L (ref 3.7–5.3)
Sodium: 139 mEq/L (ref 137–147)

## 2013-11-28 LAB — CBC
HCT: 36.7 % — ABNORMAL LOW (ref 39.0–52.0)
Hemoglobin: 12.9 g/dL — ABNORMAL LOW (ref 13.0–17.0)
MCH: 32 pg (ref 26.0–34.0)
MCHC: 35.1 g/dL (ref 30.0–36.0)
MCV: 91.1 fL (ref 78.0–100.0)
Platelets: 231 10*3/uL (ref 150–400)
RBC: 4.03 MIL/uL — ABNORMAL LOW (ref 4.22–5.81)
RDW: 13.5 % (ref 11.5–15.5)
WBC: 7.4 10*3/uL (ref 4.0–10.5)

## 2013-11-28 LAB — POCT ACTIVATED CLOTTING TIME
ACTIVATED CLOTTING TIME: 265 s
ACTIVATED CLOTTING TIME: 310 s
Activated Clotting Time: 260 seconds
Activated Clotting Time: 310 seconds

## 2013-11-28 LAB — PROTIME-INR
INR: 0.79 (ref 0.00–1.49)
Prothrombin Time: 10.9 seconds — ABNORMAL LOW (ref 11.6–15.2)

## 2013-11-28 SURGERY — LEFT HEART CATHETERIZATION WITH CORONARY ANGIOGRAM
Anesthesia: LOCAL

## 2013-11-28 MED ORDER — IPRATROPIUM-ALBUTEROL 0.5-2.5 (3) MG/3ML IN SOLN
3.0000 mL | RESPIRATORY_TRACT | Status: DC | PRN
  Filled 2013-11-28: qty 3

## 2013-11-28 MED ORDER — ASPIRIN 81 MG PO CHEW
81.0000 mg | CHEWABLE_TABLET | Freq: Every day | ORAL | Status: DC
  Administered 2013-11-29: 81 mg via ORAL
  Filled 2013-11-28: qty 1

## 2013-11-28 MED ORDER — SODIUM CHLORIDE 0.9 % IV SOLN: 1.0000 mL/kg/h | INTRAVENOUS | Status: AC

## 2013-11-28 MED ORDER — FENTANYL CITRATE 0.05 MG/ML IJ SOLN
INTRAMUSCULAR | Status: AC
  Filled 2013-11-28: qty 2

## 2013-11-28 MED ORDER — ALPRAZOLAM 0.5 MG PO TABS: 0.5000 mg | ORAL_TABLET | Freq: Two times a day (BID) | ORAL | Status: DC | PRN

## 2013-11-28 MED ORDER — VERAPAMIL HCL 2.5 MG/ML IV SOLN
INTRAVENOUS | Status: AC
  Filled 2013-11-28: qty 2

## 2013-11-28 MED ORDER — CLOPIDOGREL BISULFATE 75 MG PO TABS: 75.0000 mg | ORAL_TABLET | Freq: Every day | ORAL | Status: DC

## 2013-11-28 MED ORDER — TIZANIDINE HCL 2 MG PO TABS
4.0000 mg | ORAL_TABLET | Freq: Every day | ORAL | Status: DC
  Administered 2013-11-28: 4 mg via ORAL
  Filled 2013-11-28 (×2): qty 2

## 2013-11-28 MED ORDER — ATORVASTATIN CALCIUM 40 MG PO TABS
40.0000 mg | ORAL_TABLET | Freq: Every day | ORAL | Status: DC
  Administered 2013-11-28 – 2013-11-29 (×2): 40 mg via ORAL
  Filled 2013-11-28 (×2): qty 1

## 2013-11-28 MED ORDER — MIDAZOLAM HCL 2 MG/2ML IJ SOLN
INTRAMUSCULAR | Status: AC
  Filled 2013-11-28: qty 2

## 2013-11-28 MED ORDER — ASPIRIN 81 MG PO CHEW
81.0000 mg | CHEWABLE_TABLET | ORAL | Status: AC
  Administered 2013-11-28: 81 mg via ORAL
  Filled 2013-11-28: qty 1

## 2013-11-28 MED ORDER — SODIUM CHLORIDE 0.9 % IJ SOLN: 3.0000 mL | INTRAMUSCULAR | Status: DC | PRN

## 2013-11-28 MED ORDER — METOPROLOL SUCCINATE ER 25 MG PO TB24
25.0000 mg | ORAL_TABLET | Freq: Every day | ORAL | Status: DC
  Administered 2013-11-29: 09:00:00 25 mg via ORAL
  Filled 2013-11-28: qty 1

## 2013-11-28 MED ORDER — HEPARIN SODIUM (PORCINE) 1000 UNIT/ML IJ SOLN
INTRAMUSCULAR | Status: AC
  Filled 2013-11-28: qty 1

## 2013-11-28 MED ORDER — RANOLAZINE ER 500 MG PO TB12
500.0000 mg | ORAL_TABLET | Freq: Two times a day (BID) | ORAL | Status: DC
  Administered 2013-11-28 – 2013-11-29 (×2): 500 mg via ORAL
  Filled 2013-11-28 (×3): qty 1

## 2013-11-28 MED ORDER — NITROGLYCERIN 0.4 MG SL SUBL: 0.4000 mg | SUBLINGUAL_TABLET | SUBLINGUAL | Status: DC | PRN

## 2013-11-28 MED ORDER — ADENOSINE 12 MG/4ML IV SOLN
16.0000 mL | Freq: Once | INTRAVENOUS | Status: AC
  Administered 2013-11-28: 48 mg via INTRAVENOUS
  Filled 2013-11-28: qty 16

## 2013-11-28 MED ORDER — POTASSIUM CHLORIDE ER 10 MEQ PO TBCR
10.0000 meq | EXTENDED_RELEASE_TABLET | Freq: Every day | ORAL | Status: DC
  Administered 2013-11-29: 10 meq via ORAL
  Filled 2013-11-28: qty 1

## 2013-11-28 MED ORDER — LIDOCAINE HCL (PF) 1 % IJ SOLN
INTRAMUSCULAR | Status: AC
  Filled 2013-11-28: qty 30

## 2013-11-28 MED ORDER — AMLODIPINE BESYLATE 10 MG PO TABS
10.0000 mg | ORAL_TABLET | Freq: Every day | ORAL | Status: DC
  Filled 2013-11-28: qty 1

## 2013-11-28 MED ORDER — ASPIRIN 81 MG PO TABS: 81.0000 mg | ORAL_TABLET | Freq: Every day | ORAL | Status: DC

## 2013-11-28 MED ORDER — CLOPIDOGREL BISULFATE 300 MG PO TABS
ORAL_TABLET | ORAL | Status: AC
  Filled 2013-11-28: qty 1

## 2013-11-28 MED ORDER — NITROGLYCERIN 0.2 MG/ML ON CALL CATH LAB
INTRAVENOUS | Status: AC
  Filled 2013-11-28: qty 1

## 2013-11-28 MED ORDER — HEPARIN (PORCINE) IN NACL 2-0.9 UNIT/ML-% IJ SOLN
INTRAMUSCULAR | Status: AC
  Filled 2013-11-28: qty 1500

## 2013-11-28 MED ORDER — TRAMADOL HCL 50 MG PO TABS
50.0000 mg | ORAL_TABLET | Freq: Three times a day (TID) | ORAL | Status: DC
  Administered 2013-11-28 – 2013-11-29 (×3): 50 mg via ORAL
  Filled 2013-11-28 (×3): qty 1

## 2013-11-28 MED ORDER — VENLAFAXINE HCL 75 MG PO TABS
75.0000 mg | ORAL_TABLET | Freq: Two times a day (BID) | ORAL | Status: DC
  Administered 2013-11-28 – 2013-11-29 (×2): 75 mg via ORAL
  Filled 2013-11-28 (×3): qty 1

## 2013-11-28 MED ORDER — LISINOPRIL 5 MG PO TABS
5.0000 mg | ORAL_TABLET | Freq: Every day | ORAL | Status: DC
  Administered 2013-11-29: 5 mg via ORAL
  Filled 2013-11-28: qty 1

## 2013-11-28 MED ORDER — CLOPIDOGREL BISULFATE 75 MG PO TABS
75.0000 mg | ORAL_TABLET | Freq: Every day | ORAL | Status: DC
  Administered 2013-11-29: 09:00:00 75 mg via ORAL
  Filled 2013-11-28: qty 1

## 2013-11-28 MED ORDER — PANTOPRAZOLE SODIUM 40 MG PO TBEC
40.0000 mg | DELAYED_RELEASE_TABLET | Freq: Every day | ORAL | Status: DC
  Administered 2013-11-29: 09:00:00 40 mg via ORAL
  Filled 2013-11-28: qty 1

## 2013-11-28 MED ORDER — LEVOTHYROXINE SODIUM 25 MCG PO TABS
25.0000 ug | ORAL_TABLET | Freq: Every day | ORAL | Status: DC
  Administered 2013-11-29: 25 ug via ORAL
  Filled 2013-11-28 (×2): qty 1

## 2013-11-28 MED ORDER — SODIUM CHLORIDE 0.9 % IJ SOLN: 3.0000 mL | Freq: Two times a day (BID) | INTRAMUSCULAR | Status: DC

## 2013-11-28 MED ORDER — SODIUM CHLORIDE 0.9 % IV SOLN
250.0000 mL | INTRAVENOUS | Status: DC | PRN
  Administered 2013-11-28: 11:00:00 via INTRAVENOUS

## 2013-11-28 NOTE — Interval H&P Note (Signed)
Cath Lab Visit (complete for each Cath Lab visit)  Clinical Evaluation Leading to the Procedure:   ACS: no  Non-ACS:    Anginal Classification: CCS III  Anti-ischemic medical therapy: Maximal Therapy (2 or more classes of medications)  Non-Invasive Test Results: No non-invasive testing performed  Prior CABG: No previous CABG      History and Physical Interval Note:  11/28/2013 2:35 PM  [name]  has presented today for surgery, with the diagnosis of cad  The various methods of treatment have been discussed with the patient and family. After consideration of risks, benefits and other options for treatment, the patient has consented to  Procedure(s): LEFT HEART CATHETERIZATION WITH CORONARY ANGIOGRAM (N/A) as a surgical intervention .  The patient's history has been reviewed, patient examined, no change in status, stable for surgery.  I have reviewed the patient's chart and labs.  Questions were answered to the patient's satisfaction.     Roy [name]

## 2013-11-28 NOTE — H&P (View-Only) (Signed)
Patient ID: Roy [name], male   DOB: 23-Dec-1955, 58 y.o.   MRN: [id number]      SUBJECTIVE: Roy [name] has a h/o CAD with stents and hepatitis C. While cardiac catheterization did not identify a culprit lesion, he was noted to have diffuse 3-vessel disease. He has not tolerated Imdur in the past.  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  The though after cardiac cath was either to obtain a nuclear myocardial perfusion study to localize the area of ischemia vs FFR of the mid-LAD lesion.   He was reportedly hospitalized in December 2014 for chest pain at [facility], deemed secondary to chemical pneumonitis. He ruled out for an acute coronary syndrome.   Since his last visit with me a few months ago, he has been having increased fatigue and has been lying around in the bed more. He says, "This is not like me". He has had less strength when performing his duties at work as well. He has had chest pain radiating into the base of his neck and his jaw. He has also had more shortness of breath.     Allergies  Allergen Reactions  . Codeine Itching  . Food     PEACHES  . Imdur [Isosorbide]     Has not tolerated well in the past  . Penicillins     Current Outpatient Prescriptions  Medication Sig Dispense Refill  . amLODipine (NORVASC) 10 MG tablet Take 1 tablet (10 mg total) by mouth daily.  30 tablet  6  . aspirin 81 MG tablet Take 81 mg by mouth daily.        [name] atorvastatin (LIPITOR) 40 MG tablet Take 1 tablet (40 mg total) by mouth daily.  30 tablet  6  . clopidogrel (PLAVIX) 75 MG tablet TAKE (1) TABLET BY MOUTH ONCE DAILY.  30 tablet  6  . furosemide (LASIX) 20 MG tablet Take 20 mg by mouth.      [name] ipratropium-albuterol (DUONEB) 0.5-2.5 (3) MG/3ML SOLN Inhale 3 mLs into the lungs every 4 (four) hours as needed.      [name] levothyroxine (SYNTHROID, LEVOTHROID) 25 MCG tablet Take 25 mcg by mouth daily  before breakfast.      . lisinopril (PRINIVIL,ZESTRIL) 5 MG tablet Take 1 tablet (5 mg total) by mouth daily.  90 tablet  3  . metoprolol succinate (TOPROL-XL) 25 MG 24 hr tablet TAKE 1 TABLET ONCE DAILY.  30 tablet  3  . nitroGLYCERIN (NITROSTAT) 0.4 MG SL tablet Place 1 tablet (0.4 mg total) under the tongue every 5 (five) minutes as needed.  25 tablet  3  . omeprazole-sodium bicarbonate (ZEGERID) 40-1100 MG per capsule TAKE 1 CAPSULE BY MOUTH AT BEDTIME.  30 capsule  5  . potassium chloride (K-DUR) 10 MEQ tablet Take 10 mEq by mouth daily.      . ranolazine (RANEXA) 500 MG 12 hr tablet Take 1 tablet (500 mg total) by mouth 2 (two) times daily.  60 tablet  6  . tiZANidine (ZANAFLEX) 4 MG capsule Take 4 mg by mouth at bedtime.       . traMADol (ULTRAM) 50 MG tablet Take 50 mg by mouth 3 (three) times daily.       [name] venlafaxine (EFFEXOR) 75 MG tablet Take 1 tablet by mouth 2 (two) times daily.       No current  facility-administered medications for this visit.    Past Medical History  Diagnosis Date  . Hepatitis C    Hepatitis 1C.  [name] CAD (coronary artery disease)     a. Prior history of BMS to OM and RCA, subsequent cutting balloon angioplasty due to ISR in these distributions, also BMS placement with DES to LAD. b. Cath 01/2013: diffuse 3V CAD without clear culprit lesion, normal LVEF.  [name] Hyperlipidemia   . DDD (degenerative disc disease)   . GERD (gastroesophageal reflux disease)   . Reflux   . Abdominal pain, right upper quadrant   . Depression   . COPD (chronic obstructive pulmonary disease)   . Myocardial infarction 2007 ?  [name] Hypertension   . Kidney stones   . Sinus bradycardia     Past Surgical History  Procedure Laterality Date  . Liver biopsy  03/24/2010  . Hernia repair    . Colonoscopy    . Cardiac catheterization    . Coronary angioplasty with stent placement    . Colonoscopy N/A 05/02/2013    Procedure: COLONOSCOPY;  Surgeon: Rogene Houston, MD;  Location: [facility];  Service: Endoscopy;  Laterality: N/A;  1030-moved to 1055 Ann to notify pt    History   Social History  . Marital Status: Married    Spouse Name: N/A    Number of Children: N/A  . Years of Education: N/A   Occupational History  . Not on file.   Social History Main Topics  . Smoking status: Former Smoker    Types: Cigarettes    Quit date: 02/28/2008  . Smokeless tobacco: Never Used  . Alcohol Use: No  . Drug Use: No  . Sexual Activity: Not on file   Other Topics Concern  . Not on file   Social History Narrative  . No narrative on file     Filed Vitals:   11/27/13 1037  BP: 123/86  Pulse: 96  Height: 5\' 9"  (1.753 m)  Weight: 187 lb (84.823 kg)    PHYSICAL EXAM General: NAD Neck: No JVD, no thyromegaly. Lungs: Clear to auscultation bilaterally with normal respiratory effort. CV: Nondisplaced PMI.  Regular rate and rhythm, normal S1/S2, no S3/S4, no murmur. No pretibial or periankle edema.  No carotid bruit.  Normal pedal pulses.  Abdomen: Soft, nontender, no hepatosplenomegaly, no distention.  Neurologic: Alert and oriented x 3.  Psych: Normal affect. Extremities: No clubbing or cyanosis.   ECG: reviewed and available in electronic records.      ASSESSMENT AND PLAN: 1. CAD: Given his new symptoms of increasing chest pain, fatigue, and shortness of breath, I will proceed with coronary angiography with FFR of the mid-LAD lesion to determine its hemodynamic significance. Continue ranolazine 500 mg bid along withToprol-XL 25 mg daily.  2. HTN: Well controlled with the addition of lisinopril 5 mg daily at his last visit with me. 3. Hyperlipidemia: Lipids on 06/03/13 showed improvement with LDL 143-->84, HDL 34-->38, TC 208-->145, and TG 156-->117. LFT's on 11/18/13 showed AST 11 and ALT 14. He does have hepatitis C. Continue atorvastatin 40 mg daily.   Dispo: f/u after cath.  Kate Sable, M.D., F.A.C.C.

## 2013-11-28 NOTE — CV Procedure (Signed)
PROCEDURE:  Left heart catheterization with selective coronary angiography, left ventriculogram.  FFR of the LAD, PCI of LAD. FFR of the RCA, PCI of the RCA  INDICATIONS:  Angina  The risks, benefits, and details of the procedure were explained to the patient.  The patient verbalized understanding and wanted to proceed.  Informed written consent was obtained.  PROCEDURE TECHNIQUE:  After Xylocaine anesthesia a 54F slender sheath was placed in the right radial artery with a single anterior needle wall stick.  IV heparin was given. Right coronary angiography was done using a Judkins R4 guide catheter.  Left coronary angiography was done using an EBU 3.0 guide catheter.  Left ventriculography was done using a pigtail catheter.  PCI was performed. Please see below for details. A TR band was used for hemostasis.   CONTRAST:  Total of 180 cc.  COMPLICATIONS:  None.    HEMODYNAMICS:  Aortic pressure was 121/65; LV pressure was 123/7; LVEDP 14.  There was no gradient between the left ventricle and aorta.    ANGIOGRAPHIC DATA:   The left main coronary artery is widely patent with mild distal disease.  The left anterior descending artery is a large vessel which wraps around the apex. There is a stent in the mid vessel. Just before the stent, there is a 50-60% stenosis. There is mild in-stent restenosis in the proximal portion of the stent. The distal portion of the stent is widely patent. There several small to medium-size diagonals which are patent. In the more distal portion of the mid vessel, there is a 60% eccentric stenosis. The apical LAD has mild disease but is patent.  The left circumflex artery is a large vessel. The first obtuse marginal is widely patent. There is a ramus which is medium-sized and patent.  The remainder of the circumflex is widely patent with only mild atherosclerosis.  The right coronary artery is a large dominant vessel. In the proximal vessel, there is some moderate  disease, up to 60-70% just before the mid stent. The stent is patent with mild in-stent restenosis. There is an early bifurcation of the posterior lateral artery and posterior descending artery, both of which are patent.  LEFT VENTRICULOGRAM:  Left ventricular angiogram was done in the 30 RAO projection and revealed normal left ventricular wall motion and systolic function with an estimated ejection fraction of 55 %.  LVEDP was 14 mmHg.  INTERVENTIONAL NARRATIVE: IV heparin was used for anticoagulation. An ACT was used to check that the heparin was therapeutic. A CLS 3.0 guiding catheters using his left main. A pressure wire was normalized at the ostium of the left main. Pressure wire was placed across the area disease in the LAD. Resting FFR was 0.92. After IV adenosine, the FFR was 0.76. Intervention was then performed. The more distal lesion in the LAD was ballooned with a 2.5 x 12 balloon. This was stented with a 2.75 x 15 Xience Alpine stent, postdilated with a 3.0 x 12 [place] track balloon.  The same balloon was used to predilate the area in the more proximal portion of the LAD, just before the previously placed stent. A 3.0 x 15 Xience drug-eluting stent was deployed. This was post dilated with a 3.5 x 12 noncompliant balloon. Intra-coronary nitroglycerin was given. There was an excellent angiographic result.  A JR 4 guiding catheter was used to engage the RCA. A pressure wire was placed at the ostium of the RCA and normalized. It was then  advanced across the area disease. The resting FFR was 0.90. After IV adenosine, the FFR was 0.79. The area disease in the proximal RCA was then ballooned with a 2.5 x 20 balloon. A 3.0 x 28 Xience drug-eluting stent was deployed. A 3.5 x 20 noncompliant balloon was used to post dilate the stent. There was an excellent angiographic result. The patient tolerated the procedure well.  IMPRESSIONS:  1. Patent left main coronary artery. 2. Significant disease in the mid  left anterior descending artery by FFR. 2 discrete areas were spot stented. The more proximal area with a 3.0 x 15 drug-eluting stent. The more distal area with a 2.75 x 15 drug-eluting stent, postdilated as above. 3. Mild atherosclerosis in the left circumflex artery and its branches. 4. Significant proximal disease in the right coronary artery by FFR.  This was stented with a 3.0 x 28 drug-eluting stent, postdilated to 3.6 mm in diameter. 5. Normal left ventricular systolic function.  LVEDP 14 mmHg.  Ejection fraction 55 %.  RECOMMENDATION:  Continue dual antiplatelet therapy for at least a year. He will followup with Dr. Bronson Ing.  Continue aggressive secondary prevention. Anticipate discharge tomorrow if there no complications.

## 2013-11-29 ENCOUNTER — Encounter (HOSPITAL_COMMUNITY): Payer: Self-pay | Admitting: Physician Assistant

## 2013-11-29 DIAGNOSIS — I209 Angina pectoris, unspecified: Secondary | ICD-10-CM

## 2013-11-29 DIAGNOSIS — I2510 Atherosclerotic heart disease of native coronary artery without angina pectoris: Secondary | ICD-10-CM

## 2013-11-29 DIAGNOSIS — I200 Unstable angina: Secondary | ICD-10-CM

## 2013-11-29 LAB — CBC
HCT: 34.7 % — ABNORMAL LOW (ref 39.0–52.0)
Hemoglobin: 12.1 g/dL — ABNORMAL LOW (ref 13.0–17.0)
MCH: 31.8 pg (ref 26.0–34.0)
MCHC: 34.9 g/dL (ref 30.0–36.0)
MCV: 91.1 fL (ref 78.0–100.0)
PLATELETS: 211 10*3/uL (ref 150–400)
RBC: 3.81 MIL/uL — ABNORMAL LOW (ref 4.22–5.81)
RDW: 13.8 % (ref 11.5–15.5)
WBC: 8.1 10*3/uL (ref 4.0–10.5)

## 2013-11-29 LAB — BASIC METABOLIC PANEL
BUN: 11 mg/dL (ref 6–23)
CALCIUM: 9.3 mg/dL (ref 8.4–10.5)
CO2: 25 mEq/L (ref 19–32)
Chloride: 104 mEq/L (ref 96–112)
Creatinine, Ser: 0.91 mg/dL (ref 0.50–1.35)
GFR calc Af Amer: >90 mL/min (ref >90)
GFR calc non Af Amer: >90 mL/min (ref >90)
GLUCOSE: 87 mg/dL (ref 70–99)
POTASSIUM: 4.2 meq/L (ref 3.7–5.3)
Sodium: 139 mEq/L (ref 137–147)

## 2013-11-29 MED ORDER — AMLODIPINE BESYLATE 5 MG PO TABS
5.0000 mg | ORAL_TABLET | Freq: Every day | ORAL | Status: DC
  Administered 2013-11-29: 5 mg via ORAL
  Filled 2013-11-29: qty 1

## 2013-11-29 NOTE — Progress Notes (Signed)
Patient Name: Roy [name] Date of Encounter: 11/29/2013     Active Problems:   Other and unspecified angina pectoris    SUBJECTIVE  Denies any chest pain, shortness of breath. A little sore over R radial cath site, however no numbness or tingling or R fingers. Denies any acute issue overnight  CURRENT MEDS . amLODipine  10 mg Oral Daily  . aspirin  81 mg Oral Daily  . atorvastatin  40 mg Oral Daily  . clopidogrel  75 mg Oral Q breakfast  . levothyroxine  25 mcg Oral QAC breakfast  . lisinopril  5 mg Oral Daily  . metoprolol succinate  25 mg Oral Daily  . pantoprazole  40 mg Oral Daily  . potassium chloride  10 mEq Oral Daily  . ranolazine  500 mg Oral BID  . tiZANidine  4 mg Oral QHS  . traMADol  50 mg Oral TID  . venlafaxine  75 mg Oral BID    OBJECTIVE  Filed Vitals:   11/28/13 1900 11/28/13 2026 11/29/13 0012 11/29/13 0530  BP: 104/49 102/67 95/68 134/76  Pulse: 65 50 49 57  Temp:  98.7 F (37.1 C) 98.3 F (36.8 C) 98.4 F (36.9 C)  TempSrc:  Oral Oral Oral  Resp:  18 17 17   Height:      Weight:   187 lb 13.3 oz (85.2 kg)   SpO2: 99% 98% 97% 97%    Intake/Output Summary (Last 24 hours) at 11/29/13 0630 Last data filed at 11/29/13 0533  Gross per 24 hour  Intake  369.6 ml  Output    800 ml  Net -430.4 ml   Filed Weights   11/28/13 1023 11/29/13 0012  Weight: 187 lb (84.823 kg) 187 lb 13.3 oz (85.2 kg)    PHYSICAL EXAM  General: Pleasant, NAD. Neuro: Alert and oriented X 3. Moves all extremities spontaneously. Psych: Normal affect. HEENT:  Normal  Neck: Supple without bruits or JVD. Lungs:  Resp regular and unlabored, CTA. Heart: RRR no s3, s4, or murmurs. Abdomen: Soft, non-tender, non-distended, BS + x 4.  Extremities: No clubbing, cyanosis or edema. DP/PT/Radials 2+ and equal bilaterally. R cath site clean, dry, intact, without significant bleeding or hematoma  Accessory Clinical Findings  CBC  Recent Labs  11/28/13 1043  11/29/13 0430  WBC 7.4 8.1  HGB 12.9* 12.1*  HCT 36.7* 34.7*  MCV 91.1 91.1  PLT 231 932   Basic Metabolic Panel  Recent Labs  11/28/13 1043 11/29/13 0430  NA 139 139  K 4.2 4.2  CL 101 104  CO2 28 25  GLUCOSE 110* 87  BUN 17 11  CREATININE 1.06 0.91  CALCIUM 9.2 9.3   Liver Function Tests No results found for this basename: AST, ALT, ALKPHOS, BILITOT, PROT, ALBUMIN,  in the last 72 hours No results found for this basename: LIPASE, AMYLASE,  in the last 72 hours Cardiac Enzymes No results found for this basename: CKTOTAL, CKMB, CKMBINDEX, TROPONINI,  in the last 72 hours BNP No components found with this basename: POCBNP,  D-Dimer No results found for this basename: DDIMER,  in the last 72 hours Hemoglobin A1C No results found for this basename: HGBA1C,  in the last 72 hours Fasting Lipid Panel No results found for this basename: CHOL, HDL, LDLCALC, TRIG, CHOLHDL, LDLDIRECT,  in the last 72 hours Thyroid Function Tests No results found for this basename: TSH, T4TOTAL, FREET3, T3FREE, THYROIDAB,  in the last 72 hours  TELE  NSR  with HR 40s during sleep, 50s when awake. 4 beats run of NSVT vs AIVR  ECG  Sinus brady with HR in 40s, no significant ST-T wave changes  Radiology/Studies  No results found.  ASSESSMENT AND PLAN  1. Class III angina with known CAD during last cath 01/30/2013  - increasing CP, shortness of breath, on maximal antianginal therapy  - Cath 11/28/2013 EF 55%, significant FFR of mid LAD lesion, DES to mid LAD (FFR 0.92), DES to dist LAD lesion, DES to prox RCA (FFR 0.90)  - DAPT for at least 1 yr (ASA and plaix), continue BB and lipitor  - if no recurrent chest discomfort, can consider discontiue Renexa  - 4 beats run of NSVT vs AIVR overnight, no recurrence, electrolyte normal  - potential discharge today, Dr. Tamala Julian to see   2. H/o CAD  - Cath 01/30/2013 3-v dx, mid LAD lesion 70-75% treated medically   3. HTN  borderline hypotensive  during admission, decrease amlodipine to 5mg  daily  4. Hyperlipidemia   5. Asymptomatic bradycardia on BB  Signed, Almyra Deforest PA

## 2013-11-29 NOTE — Discharge Instructions (Signed)
Angina Pectoris Angina pectoris is extreme discomfort in your chest, neck, or arm. Your doctor may call it just angina. It is caused by a lack of oxygen to your heart wall. It may feel like tightness or heavy pressure. It may feel like a crushing or squeezing pain. Some people say it feels like gas. It may go down your shoulders, back, and arms. Some people have symptoms other than pain. These include:  Tiredness.  Shortness of breath.  Cold sweats.  Feeling sick to your stomach (nausea). There are four types of angina:  Stable angina often lasts the same amount of time each time it happens. Activity, stress, or excitement can bring it on. It often gets better after taking a special medicine called nitroglycerin. This goes under your tongue.  Unstable angina can happen when you are not active or even during sleep. It can suddenly get worse or happen more often. It may not get better after taking the special medicine. It can last up to 30 minutes.  Microvascular angina is more common in women. It may be more severe or last longer than other types.  Prinzmetal angina often happens when you are not active or in the early morning hours. HOME CARE   Only take medicines as told by your doctor.  Stay active or exercise more as told by your doctor.  Limit very hard activity as told by your doctor.  Limit heavy lifting as told by your doctor.  Keep a healthy weight.  Learn about and eat foods that are healthy for your heart.  Do not smoke. GET HELP RIGHT AWAY IF:   You have chest, neck, deep shoulder, or arm pain or discomfort that lasts more than a few minutes.  You have chest, neck, deep shoulder, or arm pain or discomfort that goes away and comes back over and over again.  You have heavy sweating that seems to happen for no reason.  You have shortness of breath or trouble breathing.  Your angina does not get better after a few minutes of rest.  Your angina does not get better  after you take nitroglycerin medicine. These can all be symptoms of a heart attack. Get help right away. Call your local emergency service (911 in [place] Do not  drive yourself to the hospital. Do not  wait to for your symptoms to go away. MAKE SURE YOU:   Understand these instructions.  Will watch your condition.  Will get help right away if you are not doing well or get worse. Document Released: 12/07/2007 Document Revised: 06/06/2012 Document Reviewed: 03/29/2012 [facility] Patient Information 2014 [place], [place].    No driving for 48 hours. No lifting over 5 lbs for 1 week. No sexual activity for 1 week. You may return to work on June 3rd 2015. Keep procedure site clean & dry. If you notice increased pain, swelling, bleeding or pus, call/return!  You may shower, but no soaking baths/hot tubs/pools for 1 week.

## 2013-11-29 NOTE — Progress Notes (Signed)
The patient is asymptomatic this morning. He denies angina. Right radial cath site is unremarkable. Laboratory data is unremarkable. Cardiac exam is normal.  The patient is ready for discharge.

## 2013-11-29 NOTE — Discharge Summary (Signed)
Discharge Summary   Patient ID: Roy [name],  MRN: [id number], DOB/AGE: 58-Mar-1957 58 y.o.  Admit date: 11/28/2013 Discharge date: 11/29/2013  Primary Care Provider: [facility] Primary Cardiologist: Herminio Commons, MD  Discharge Diagnoses Principal Problem:   Angina, class III Active Problems:   CAD, NATIVE VESSEL   HYPERLIPIDEMIA-MIXED   HYPERTENSION, UNSPECIFIED   BRADYCARDIA   Unspecified hypothyroidism   Allergies Allergies  Allergen Reactions  . Codeine Itching  . Imdur [Isosorbide]     Has not tolerated well in the past  . Penicillins Other (See Comments)    Chest pain  . Food Rash    PEACHES    Procedures  PROCEDURE: Left heart catheterization with selective coronary angiography, left ventriculogram. FFR of the LAD, PCI of LAD. FFR of the RCA, PCI of the RCA  IMPRESSIONS:  1. Patent left main coronary artery. 2. Significant disease in the mid left anterior descending artery by FFR. 2 discrete areas were spot stented. The more proximal area with a 3.0 x 15 drug-eluting stent. The more distal area with a 2.75 x 15 drug-eluting stent, postdilated as above. 3. Mild atherosclerosis in the left circumflex artery and its branches. 4. Significant proximal disease in the right coronary artery by FFR. This was stented with a 3.0 x 28 drug-eluting stent, postdilated to 3.6 mm in diameter. 5. Normal left ventricular systolic function. LVEDP 14 mmHg. Ejection fraction 55 %. 6.  RECOMMENDATION: Continue dual antiplatelet therapy for at least a year. He will followup with Dr. Bronson Ing. Continue aggressive secondary prevention. Anticipate discharge tomorrow if there no complications.    Hospital Course  The patient is a 58 year old Caucasian male with past medical history significant for coronary disease s/p cardiac catheterization in July 2014 which revealed diffuse triple vessel disease in his native vessel, however no clear culprit lesion. He was placed on  medical management at that time for 70-75% mid LAD lesion. Per patient, he was recently hospitalized in December 2014 for chest pain at [facility]. The chest pain was deemed secondary to chemical pneumonitis.   Patient was seen by his primary cardiologist, Dr. Bronson Ing on 11/27/2013, during which visit, he complained of having increasing fatigue along with increasing exertional shortness of breath. Given his previous history of residual mid LAD disease, he was referred for cardiac catheterization with FFR. Cardiac catheterization performed on 11/28/2013 which showed preserved EF 55%, mid LAD lesion with abnormal FFR of 0.92 which was treated with DES. There was another lesion in the distal LAD which was treated with DES. Proximal RCA lesion with abnormal FFR was also treated with DES.   Patient was seen in the morning of 11/29/2013, at which time he denies any significant chest discomfort, shortness of breath, or dizziness. Radial cath site appears clean, dry, and intact with no significant bleeding or hematoma. He had a 4 beat run ventricular rhythm on telemetry overnight. There were no other ventricular ectopy and electrolytes were normal. This was reviewed by Dr. Tamala Julian as well. Patient has seen by cardiac rehabilitation nurse on 5/29. Per Dr. Tamala Julian, patient is stable for discharge at this time. Appointment has been scheduled with Dr. Louellen Molder on 12/12/2013 at 3:40pm. Repeated emphasis has been placed on compliance with dual antiplatelet medication.     Discharge Vitals Blood pressure 141/80, pulse 51, temperature 97.3 F (36.3 C), temperature source Oral, resp. rate 16, height 5\' 9"  (1.753 m), weight 187 lb 13.3 oz (85.2 kg), SpO2 97.00%.  Filed Weights   11/28/13 1023 11/29/13 0012  Weight: 187 lb (84.823 kg) 187 lb 13.3 oz (85.2 kg)    Labs  CBC  Recent Labs  11/28/13 1043 11/29/13 0430  WBC 7.4 8.1  HGB 12.9* 12.1*  HCT 36.7* 34.7*  MCV 91.1 91.1  PLT 231 505   Basic  Metabolic Panel  Recent Labs  11/28/13 1043 11/29/13 0430  NA 139 139  K 4.2 4.2  CL 101 104  CO2 28 25  GLUCOSE 110* 87  BUN 17 11  CREATININE 1.06 0.91  CALCIUM 9.2 9.3    Disposition  Pt is being discharged home today in good condition.  Follow-up Plans & Appointments      Follow-up Information   Follow up with Herminio Commons, MD On 12/12/2013. (Follow up with Dr. Raliegh Ip on 12/12/2013 at 3:40 pm)    Specialty:  Cardiology   Contact information:   [address] [place] [address] [phone]       Discharge Medications    Medication List         ALPRAZolam 0.5 MG tablet  Commonly known as:  XANAX  Take 0.5 mg by mouth 2 (two) times daily as needed for anxiety.     amLODipine 10 MG tablet  Commonly known as:  NORVASC  Take 1 tablet (10 mg total) by mouth daily.     aspirin 81 MG tablet  Take 81 mg by mouth daily.     atorvastatin 40 MG tablet  Commonly known as:  LIPITOR  Take 1 tablet (40 mg total) by mouth daily.     clopidogrel 75 MG tablet  Commonly known as:  PLAVIX  Take 75 mg by mouth daily with breakfast.     furosemide 20 MG tablet  Commonly known as:  LASIX  Take 20 mg by mouth daily.     ipratropium-albuterol 0.5-2.5 (3) MG/3ML Soln  Commonly known as:  DUONEB  Inhale 3 mLs into the lungs every 4 (four) hours as needed.     levothyroxine 25 MCG tablet  Commonly known as:  SYNTHROID, LEVOTHROID  Take 25 mcg by mouth daily before breakfast.     lisinopril 5 MG tablet  Commonly known as:  PRINIVIL,ZESTRIL  Take 1 tablet (5 mg total) by mouth daily.     metoprolol succinate 25 MG 24 hr tablet  Commonly known as:  TOPROL-XL  Take 25 mg by mouth daily.     nitroGLYCERIN 0.4 MG SL tablet  Commonly known as:  NITROSTAT  Place 1 tablet (0.4 mg total) under the tongue every 5 (five) minutes as needed.     omeprazole-sodium bicarbonate 40-1100 MG per capsule  Commonly known as:  ZEGERID  Take 1 capsule by mouth every  evening.     potassium chloride 10 MEQ tablet  Commonly known as:  K-DUR  Take 10 mEq by mouth daily.     ranolazine 500 MG 12 hr tablet  Commonly known as:  RANEXA  Take 1 tablet (500 mg total) by mouth 2 (two) times daily.     tiZANidine 4 MG capsule  Commonly known as:  ZANAFLEX  Take 4 mg by mouth at bedtime.     traMADol 50 MG tablet  Commonly known as:  ULTRAM  Take 50 mg by mouth 3 (three) times daily.     venlafaxine 75 MG tablet  Commonly known as:  EFFEXOR  Take 75 mg by mouth 2 (two) times daily.         Duration of Discharge  Encounter   Greater than 30 minutes including physician time.  Hilbert Corrigan PA 11/29/2013, 10:10 AM

## 2013-11-29 NOTE — Progress Notes (Signed)
CARDIAC REHAB PHASE I   PRE:  Rate/Rhythm: 58 SB    BP: sitting 141/80    SaO2:   MODE:  Ambulation: 700 ft   POST:  Rate/Rhythm: 81 SR    BP: sitting 146/98, recheck after 10 min 137/82    SaO2:    Tolerated well, feels much better. Ed completed and pt interested in [facility] in [place]. Will send referral.  [place], ACSM 11/29/2013 9:28 AM

## 2013-11-29 NOTE — Progress Notes (Signed)
TR BAND REMOVAL  LOCATION:    right radial  DEFLATED PER PROTOCOL:    yes  TIME BAND OFF / DRESSING APPLIED:    20:00   SITE UPON ARRIVAL:    Level 0  SITE AFTER BAND REMOVAL:    Level 0  REVERSE ALLEN'S TEST:     positive  CIRCULATION SENSATION AND MOVEMENT:    Within Normal Limits   yes  COMMENTS:    

## 2013-11-30 NOTE — Discharge Summary (Signed)
He is doing well after the stent procedure and is being discharged. The instructions and meds are as directed. He will f/u with Dr. Bronson Ing.[name]

## 2013-12-12 ENCOUNTER — Encounter: Payer: Self-pay | Admitting: Cardiovascular Disease

## 2013-12-12 ENCOUNTER — Ambulatory Visit (INDEPENDENT_AMBULATORY_CARE_PROVIDER_SITE_OTHER): Payer: BC Managed Care – PPO | Admitting: Cardiovascular Disease

## 2013-12-12 VITALS — BP 115/77 | HR 65 | Ht 69.0 in | Wt 188.0 lb

## 2013-12-12 DIAGNOSIS — I10 Essential (primary) hypertension: Secondary | ICD-10-CM

## 2013-12-12 DIAGNOSIS — I2510 Atherosclerotic heart disease of native coronary artery without angina pectoris: Secondary | ICD-10-CM

## 2013-12-12 DIAGNOSIS — Z955 Presence of coronary angioplasty implant and graft: Secondary | ICD-10-CM

## 2013-12-12 DIAGNOSIS — Z9861 Coronary angioplasty status: Secondary | ICD-10-CM

## 2013-12-12 DIAGNOSIS — E785 Hyperlipidemia, unspecified: Secondary | ICD-10-CM

## 2013-12-12 DIAGNOSIS — Z79899 Other long term (current) drug therapy: Secondary | ICD-10-CM

## 2013-12-12 NOTE — Patient Instructions (Signed)
   Stop Ranexa Continue all other medications.   Your physician wants you to follow up in: 6 months.  You will receive a reminder letter in the mail one-two months in advance.  If you don't receive a letter, please call our office to schedule the follow up appointment

## 2013-12-12 NOTE — Progress Notes (Signed)
Patient ID: Roy [name], male   DOB: [birth year], 58 y.o.   MRN: [id number]      SUBJECTIVE: The patient is here to followup after undergoing coronary angiography and multivessel stent placement. He had drug-eluting stents placed to the proximal and distal LAD as well as the proximal RCA. He is feeling better. He denies chest pain. His energy levels are gradually improving.    Allergies  Allergen Reactions  . Codeine Itching  . Imdur [Isosorbide]     Has not tolerated well in the past  . Penicillins Other (See Comments)    Chest pain  . Food Rash    PEACHES    Current Outpatient Prescriptions  Medication Sig Dispense Refill  . ALPRAZolam (XANAX) 0.5 MG tablet Take 0.5 mg by mouth 2 (two) times daily as needed for anxiety.       [name] amLODipine (NORVASC) 10 MG tablet Take 1 tablet (10 mg total) by mouth daily.  30 tablet  6  . aspirin 81 MG tablet Take 81 mg by mouth daily.        [name] atorvastatin (LIPITOR) 40 MG tablet Take 1 tablet (40 mg total) by mouth daily.  30 tablet  6  . clopidogrel (PLAVIX) 75 MG tablet Take 75 mg by mouth daily with breakfast.      . furosemide (LASIX) 20 MG tablet Take 20 mg by mouth daily.       [name] ipratropium-albuterol (DUONEB) 0.5-2.5 (3) MG/3ML SOLN Inhale 3 mLs into the lungs every 4 (four) hours as needed.      [name] levothyroxine (SYNTHROID, LEVOTHROID) 25 MCG tablet Take 25 mcg by mouth daily before breakfast.      . lisinopril (PRINIVIL,ZESTRIL) 5 MG tablet Take 1 tablet (5 mg total) by mouth daily.  90 tablet  3  . metoprolol succinate (TOPROL-XL) 25 MG 24 hr tablet Take 25 mg by mouth daily.      . nitroGLYCERIN (NITROSTAT) 0.4 MG SL tablet Place 1 tablet (0.4 mg total) under the tongue every 5 (five) minutes as needed.  25 tablet  3  . omeprazole-sodium bicarbonate (ZEGERID) 40-1100 MG per capsule Take 1 capsule by mouth every evening.      . potassium chloride (K-DUR) 10 MEQ tablet Take 10 mEq by mouth daily.      . ranolazine (RANEXA) 500 MG 12 hr  tablet Take 1 tablet (500 mg total) by mouth 2 (two) times daily.  60 tablet  6  . tiZANidine (ZANAFLEX) 4 MG capsule Take 4 mg by mouth at bedtime.       . traMADol (ULTRAM) 50 MG tablet Take 50 mg by mouth 3 (three) times daily.       [name] venlafaxine (EFFEXOR) 75 MG tablet Take 75 mg by mouth 2 (two) times daily.        No current facility-administered medications for this visit.    Past Medical History  Diagnosis Date  . CAD (coronary artery disease)     a. Prior history of BMS to OM and RCA, subsequent cutting balloon angioplasty due to ISR in these distributions, also BMS placement with DES to LAD. b. Cath 01/2013: diffuse 3V CAD without clear culprit lesion, normal LVEF, mid LAD lesion noted, treat medically. c. Cath 11/28/2013 DES to mid LAD lesion with abnormal FFR, DES to distal LAD lesion, DES to prox RCA lesion with abnormal FFR  . Hyperlipidemia   . GERD (gastroesophageal reflux disease)   . Abdominal pain, right upper quadrant   .  Depression   . COPD (chronic obstructive pulmonary disease)   . Hypertension   . Sinus bradycardia   . Other and unspecified angina pectoris   . Kidney stones     "no surgeries"  . Macular degeneration of both eyes     "already blind in my left eye"  . Blind left eye   . Myocardial infarction ~ 2007   . History of blood transfusion     "when I was taking tx for hepatitis"  . Hepatitis C C    "had the tx and I'm free of it"  . DDD (degenerative disc disease)   . Arthritis     "all my joints" (11/28/2013)  . Chronic lower back pain     Past Surgical History  Procedure Laterality Date  . Liver biopsy  03/24/2010  . Colonoscopy    . Colonoscopy N/A 05/02/2013    Procedure: COLONOSCOPY;  Surgeon: Rogene Houston, MD;  Location: [facility];  Service: Endoscopy;  Laterality: N/A;  1030-moved to 1055 Ann to notify pt  . Inguinal hernia repair Bilateral ~ 2000  . Cardiac catheterization      "I've had a few without intervention"  . Coronary  angioplasty with stent placement   2007; 2008; 11/28/2013    "2 + 2; + 3"    History   Social History  . Marital Status: Married    Spouse Name: N/A    Number of Children: N/A  . Years of Education: N/A   Occupational History  . Not on file.   Social History Main Topics  . Smoking status: Former Smoker -- 1.50 packs/day for 35 years    Types: Cigarettes    Quit date: 02/27/2006  . Smokeless tobacco: Never Used  . Alcohol Use: Yes  . Drug Use: Yes     Comment: 11/28/2013 "did qthing in my teens and 20's; none since"  . Sexual Activity: Yes   Other Topics Concern  . Not on file   Social History Narrative  . No narrative on file     Filed Vitals:   12/12/13 1526  BP: 115/77  Pulse: 65  Height: 5\' 9"  (1.753 m)  Weight: 188 lb (85.276 kg)    PHYSICAL EXAM General: NAD Neck: No JVD, no thyromegaly. Lungs: Clear to auscultation bilaterally with normal respiratory effort. CV: Nondisplaced PMI.  Regular rate and rhythm, normal S1/S2, no S3/S4, no murmur. No pretibial or periankle edema.  No carotid bruit.  Normal pedal pulses.  Abdomen: Soft, nontender, no hepatosplenomegaly, no distention.  Neurologic: Alert and oriented x 3.  Psych: Normal affect. Extremities: No clubbing or cyanosis.   ECG: reviewed and available in electronic records.      ASSESSMENT AND PLAN: 1. CAD: Symptoms have improved considerably after multivessel stenting. Will discontinue Ranexa. Continue aspirin, Plavix, Lipitor, and Toprol-XL. 2. HTN: Well controlled with the addition of lisinopril 5 mg daily.  3. Hyperlipidemia: Lipids on 06/03/13 showed improvement with LDL 143-->84, HDL 34-->38, TC 208-->145, and TG 156-->117. LFT's on 11/18/13 showed AST 11 and ALT 14. He does have hepatitis C. Continue atorvastatin 40 mg daily.   Dispo: f/u 6 months.  Kate Sable, M.D., F.A.C.C.

## 2013-12-21 ENCOUNTER — Other Ambulatory Visit: Payer: Self-pay | Admitting: Cardiovascular Disease

## 2013-12-28 ENCOUNTER — Other Ambulatory Visit: Payer: Self-pay | Admitting: Cardiovascular Disease

## 2014-01-07 ENCOUNTER — Other Ambulatory Visit: Payer: Self-pay | Admitting: Cardiovascular Disease

## 2014-02-28 ENCOUNTER — Other Ambulatory Visit (INDEPENDENT_AMBULATORY_CARE_PROVIDER_SITE_OTHER): Payer: Self-pay | Admitting: Internal Medicine

## 2014-03-17 ENCOUNTER — Other Ambulatory Visit: Payer: Self-pay | Admitting: Cardiovascular Disease

## 2014-06-12 ENCOUNTER — Encounter (HOSPITAL_COMMUNITY): Payer: Self-pay | Admitting: Internal Medicine

## 2014-06-30 ENCOUNTER — Other Ambulatory Visit: Payer: Self-pay | Admitting: Cardiovascular Disease

## 2014-07-03 ENCOUNTER — Encounter: Payer: Self-pay | Admitting: Cardiovascular Disease

## 2014-07-03 ENCOUNTER — Ambulatory Visit (INDEPENDENT_AMBULATORY_CARE_PROVIDER_SITE_OTHER): Payer: BC Managed Care – PPO | Admitting: Cardiovascular Disease

## 2014-07-03 VITALS — BP 121/75 | HR 60 | Ht 69.0 in | Wt 194.0 lb

## 2014-07-03 DIAGNOSIS — I2510 Atherosclerotic heart disease of native coronary artery without angina pectoris: Secondary | ICD-10-CM

## 2014-07-03 DIAGNOSIS — R5383 Other fatigue: Secondary | ICD-10-CM

## 2014-07-03 DIAGNOSIS — E785 Hyperlipidemia, unspecified: Secondary | ICD-10-CM

## 2014-07-03 DIAGNOSIS — Z955 Presence of coronary angioplasty implant and graft: Secondary | ICD-10-CM

## 2014-07-03 DIAGNOSIS — K759 Inflammatory liver disease, unspecified: Secondary | ICD-10-CM

## 2014-07-03 DIAGNOSIS — I10 Essential (primary) hypertension: Secondary | ICD-10-CM

## 2014-07-03 MED ORDER — METOPROLOL SUCCINATE ER 25 MG PO TB24: 12.5000 mg | ORAL_TABLET | Freq: Every day | ORAL | Status: DC

## 2014-07-03 NOTE — Patient Instructions (Signed)
   Decrease Toprol XL to 12.5mg  twice a day   Continue all other medications.   Your physician wants you to follow up in: 6 months.  You will receive a reminder letter in the mail one-two months in advance.  If you don't receive a letter, please call our office to schedule the follow up appointment

## 2014-07-03 NOTE — Progress Notes (Signed)
Patient ID: Roy [name], male   DOB: 1955-09-24, 58 y.o.   MRN: [id number]      SUBJECTIVE: The patient returns for follow-up of coronary artery disease with multivessel stenting, hypertension, and hyperlipidemia. He denies chest pain, palpitations, orthopnea, and shortness of breath. He feels like he's lost his "get up and go" to some degree, as he fatigue slightly earlier than he used to. He has not had to use any SL nitroglycerin since his last visit with me.   Review of Systems: As per "subjective", otherwise negative.  Allergies  Allergen Reactions  . Codeine Itching  . Imdur [Isosorbide]     Has not tolerated well in the past  . Penicillins Other (See Comments)    Chest pain  . Food Rash    PEACHES    Current Outpatient Prescriptions  Medication Sig Dispense Refill  . ALPRAZolam (XANAX) 0.5 MG tablet Take 0.5 mg by mouth 2 (two) times daily as needed for anxiety.     [name] amLODipine (NORVASC) 10 MG tablet TAKE 1 TABLET ONCE DAILY. 30 tablet 6  . aspirin 81 MG tablet Take 81 mg by mouth daily.      [name] atorvastatin (LIPITOR) 40 MG tablet TAKE 1 TABLET ONCE DAILY. 30 tablet 6  . clopidogrel (PLAVIX) 75 MG tablet Take 75 mg by mouth daily with breakfast.    . clopidogrel (PLAVIX) 75 MG tablet TAKE 1 TABLET ONCE DAILY. 30 tablet 6  . furosemide (LASIX) 20 MG tablet Take 20 mg by mouth daily.     [name] ipratropium-albuterol (DUONEB) 0.5-2.5 (3) MG/3ML SOLN Inhale 3 mLs into the lungs every 4 (four) hours as needed.    [name] levothyroxine (SYNTHROID, LEVOTHROID) 25 MCG tablet Take 25 mcg by mouth daily before breakfast.    . lisinopril (PRINIVIL,ZESTRIL) 5 MG tablet TAKE (1) TABLET BY MOUTH ONCE DAILY. 90 tablet 0  . metoprolol succinate (TOPROL-XL) 25 MG 24 hr tablet Take 25 mg by mouth daily.    . metoprolol succinate (TOPROL-XL) 25 MG 24 hr tablet TAKE 1 TABLET ONCE DAILY. 30 tablet 6  . nitroGLYCERIN (NITROSTAT) 0.4 MG SL tablet Place 1 tablet (0.4 mg total) under the tongue every 5  (five) minutes as needed. 25 tablet 3  . omeprazole-sodium bicarbonate (ZEGERID) 40-1100 MG per capsule Take 1 capsule by mouth every evening.    [name] omeprazole-sodium bicarbonate (ZEGERID) 40-1100 MG per capsule TAKE 1 CAPSULE BY MOUTH AT BEDTIME. 30 capsule 5  . potassium chloride (K-DUR) 10 MEQ tablet Take 10 mEq by mouth daily.    [name] tiZANidine (ZANAFLEX) 4 MG capsule Take 4 mg by mouth at bedtime.     . traMADol (ULTRAM) 50 MG tablet Take 50 mg by mouth 3 (three) times daily.     [name] venlafaxine (EFFEXOR) 75 MG tablet Take 75 mg by mouth 2 (two) times daily.      No current facility-administered medications for this visit.    Past Medical History  Diagnosis Date  . CAD (coronary artery disease)     a. Prior history of BMS to OM and RCA, subsequent cutting balloon angioplasty due to ISR in these distributions, also BMS placement with DES to LAD. b. Cath 01/2013: diffuse 3V CAD without clear culprit lesion, normal LVEF, mid LAD lesion noted, treat medically. c. Cath 11/28/2013 DES to mid LAD lesion with abnormal FFR, DES to distal LAD lesion, DES to prox RCA lesion with abnormal FFR  . Hyperlipidemia   . GERD (gastroesophageal reflux disease)   .  Abdominal pain, right upper quadrant   . Depression   . COPD (chronic obstructive pulmonary disease)   . Hypertension   . Sinus bradycardia   . Other and unspecified angina pectoris   . Kidney stones     "no surgeries"  . Macular degeneration of both eyes     "already blind in my left eye"  . Blind left eye   . Myocardial infarction ~ 2007   . History of blood transfusion     "when I was taking tx for hepatitis"  . Hepatitis C C    "had the tx and I'm free of it"  . DDD (degenerative disc disease)   . Arthritis     "all my joints" (11/28/2013)  . Chronic lower back pain     Past Surgical History  Procedure Laterality Date  . Liver biopsy  03/24/2010  . Colonoscopy    . Colonoscopy N/A 05/02/2013    Procedure: COLONOSCOPY;  Surgeon:  Rogene Houston, MD;  Location: [facility];  Service: Endoscopy;  Laterality: N/A;  1030-moved to 1055 Ann to notify pt  . Inguinal hernia repair Bilateral ~ 2000  . Cardiac catheterization      "I've had a few without intervention"  . Coronary angioplasty with stent placement   2007; 2008; 11/28/2013    "2 + 2; + 3"  . Left heart catheterization with coronary angiogram N/A 01/30/2013    Procedure: LEFT HEART CATHETERIZATION WITH CORONARY ANGIOGRAM;  Surgeon: Jolaine Artist, MD;  Location: [facility] CATH LAB;  Service: Cardiovascular;  Laterality: N/A;  . Left heart catheterization with coronary angiogram N/A 11/28/2013    Procedure: LEFT HEART CATHETERIZATION WITH CORONARY ANGIOGRAM;  Surgeon: Jettie Booze, MD;  Location: [facility] CATH LAB;  Service: Cardiovascular;  Laterality: N/A;    History   Social History  . Marital Status: Married    Spouse Name: N/A    Number of Children: N/A  . Years of Education: N/A   Occupational History  . Not on file.   Social History Main Topics  . Smoking status: Former Smoker -- 1.50 packs/day for 35 years    Types: Cigarettes    Quit date: 02/27/2006  . Smokeless tobacco: Never Used  . Alcohol Use: Yes  . Drug Use: Yes     Comment: 11/28/2013 "did qthing in my teens and 20's; none since"  . Sexual Activity: Yes   Other Topics Concern  . Not on file   Social History Narrative    BP 121/75  Pulse 60  SpO2 95%  Weight 194 lb (87.998 kg) Height 5\' 9"  (1.753 m)    PHYSICAL EXAM General: NAD, poor dentition HEENT: Normal. Neck: No JVD, no thyromegaly. Lungs: Clear to auscultation bilaterally with normal respiratory effort. CV: Nondisplaced PMI.  Regular rate and rhythm, normal S1/S2, no S3/S4, no murmur. No pretibial or periankle edema.  No carotid bruit.  Normal pedal pulses.  Abdomen: Soft, nontender, no distention.  Neurologic: Alert and oriented x 3.  Psych: Normal affect. Skin: Normal. Musculoskeletal: Normal range of motion, no  gross deformities. Extremities: No clubbing or cyanosis.   ECG: Most recent ECG reviewed.      ASSESSMENT AND PLAN: 1. CAD: Stable ischemic heart disease with multivessel stenting. Continue aspirin, Plavix, and Lipitor. I will reduce dose of Toprol-XL to 12.5 mg daily to see if this improves his symptoms of fatigue, given resting HR of 60 bpm. 2. Essential HTN: Well controlled on current therapy which includes lisinopril  5 mg daily. No changes. 3. Hyperlipidemia: Lipids on 06/03/13 showed improvement with LDL 143-->84, HDL 34-->38, TC 208-->145, and TG 156-->117. LFT's on 11/18/13 showed AST 11 and ALT 14. He does have hepatitis C. Continue atorvastatin 40 mg daily. I will repeat lipids and LFT's if not done recently by PCP.  Dispo: f/u 6 months.   Kate Sable, M.D., F.A.C.C.

## 2014-07-10 ENCOUNTER — Encounter: Payer: Self-pay | Admitting: *Deleted

## 2014-07-14 ENCOUNTER — Other Ambulatory Visit (INDEPENDENT_AMBULATORY_CARE_PROVIDER_SITE_OTHER): Payer: Self-pay | Admitting: Internal Medicine

## 2014-07-14 MED ORDER — OMEPRAZOLE 40 MG PO CPDR: 40.0000 mg | DELAYED_RELEASE_CAPSULE | Freq: Every day | ORAL | Status: DC

## 2014-07-14 NOTE — Telephone Encounter (Signed)
Rx fr Omeprazole 40mg  sent to his pharmacy. Zegrid was not available from Doctor, hospital per [employer]

## 2014-08-14 ENCOUNTER — Encounter (INDEPENDENT_AMBULATORY_CARE_PROVIDER_SITE_OTHER): Payer: Self-pay | Admitting: *Deleted

## 2014-08-18 ENCOUNTER — Other Ambulatory Visit: Payer: Self-pay | Admitting: Cardiovascular Disease

## 2014-10-13 ENCOUNTER — Other Ambulatory Visit: Payer: Self-pay | Admitting: Cardiovascular Disease

## 2014-10-21 ENCOUNTER — Other Ambulatory Visit: Payer: Self-pay | Admitting: Cardiovascular Disease

## 2014-11-10 ENCOUNTER — Encounter: Payer: Self-pay | Admitting: Cardiovascular Disease

## 2014-11-13 ENCOUNTER — Other Ambulatory Visit: Payer: Self-pay | Admitting: Cardiovascular Disease

## 2014-11-25 ENCOUNTER — Ambulatory Visit (INDEPENDENT_AMBULATORY_CARE_PROVIDER_SITE_OTHER): Payer: Self-pay | Admitting: Internal Medicine

## 2014-12-03 ENCOUNTER — Encounter (INDEPENDENT_AMBULATORY_CARE_PROVIDER_SITE_OTHER): Payer: Self-pay | Admitting: *Deleted

## 2014-12-08 ENCOUNTER — Ambulatory Visit: Payer: Self-pay | Admitting: Cardiovascular Disease

## 2015-01-29 ENCOUNTER — Ambulatory Visit (INDEPENDENT_AMBULATORY_CARE_PROVIDER_SITE_OTHER): Payer: BLUE CROSS/BLUE SHIELD | Admitting: Cardiovascular Disease

## 2015-01-29 ENCOUNTER — Encounter: Payer: Self-pay | Admitting: *Deleted

## 2015-01-29 ENCOUNTER — Encounter: Payer: Self-pay | Admitting: Cardiovascular Disease

## 2015-01-29 VITALS — BP 120/70 | HR 62 | Ht 69.0 in | Wt 181.0 lb

## 2015-01-29 DIAGNOSIS — E785 Hyperlipidemia, unspecified: Secondary | ICD-10-CM

## 2015-01-29 DIAGNOSIS — Z01818 Encounter for other preprocedural examination: Secondary | ICD-10-CM

## 2015-01-29 DIAGNOSIS — R5383 Other fatigue: Secondary | ICD-10-CM | POA: Diagnosis not present

## 2015-01-29 DIAGNOSIS — R0602 Shortness of breath: Secondary | ICD-10-CM | POA: Diagnosis not present

## 2015-01-29 DIAGNOSIS — I10 Essential (primary) hypertension: Secondary | ICD-10-CM

## 2015-01-29 DIAGNOSIS — K759 Inflammatory liver disease, unspecified: Secondary | ICD-10-CM

## 2015-01-29 DIAGNOSIS — I25118 Atherosclerotic heart disease of native coronary artery with other forms of angina pectoris: Secondary | ICD-10-CM | POA: Diagnosis not present

## 2015-01-29 NOTE — Patient Instructions (Signed)
Your physician wants you to follow-up in: [place] You will receive a reminder letter in the mail two months in advance. If you don't receive a letter, please call our office to schedule the follow-up appointment.  Your physician recommends that you continue on your current medications as directed. Please refer to the Current Medication list given to you today.  Your physician has requested that you have a lexiscan myoview. For further information please visit [email or web address]. Please follow instruction sheet, as given.  Your physician recommends that you return for lab work [place] FAST FOR LAB WORK   Thank you for choosing [facility]!!

## 2015-01-29 NOTE — Progress Notes (Signed)
Patient ID: Roy [name], male   DOB: 01-Feb-1956, 59 y.o.   MRN: [id number]      SUBJECTIVE: The patient returns for follow-up of coronary artery disease with multivessel stenting, hypertension, and hyperlipidemia. He is being scheduled to undergo lumbar spinal surgery for lumbar scoliosis and spondylosis with plans for interbody fusions. He has not had any recent noninvasive cardiac testing.  He denies exertional chest pain. He gets short of breath when walking up hills and does get fatigued. He works as a Holiday representative.  No tobacco use. ECG performed in the office today demonstrates normal sinus rhythm with no ischemic ST segment or T-wave abnormalities.   Review of Systems: As per "subjective", otherwise negative.  Allergies  Allergen Reactions  . Codeine Itching  . Imdur [Isosorbide]     Has not tolerated well in the past  . Penicillins Other (See Comments)    Chest pain  . Food Rash    PEACHES    Current Outpatient Prescriptions  Medication Sig Dispense Refill  . amLODipine (NORVASC) 10 MG tablet TAKE (1) TABLET BY MOUTH ONCE DAILY. 30 tablet 3  . aspirin 81 MG tablet Take 81 mg by mouth daily.      [name] atorvastatin (LIPITOR) 40 MG tablet TAKE (1) TABLET BY MOUTH ONCE DAILY. 30 tablet 6  . clopidogrel (PLAVIX) 75 MG tablet TAKE (1) TABLET BY MOUTH ONCE DAILY. 30 tablet 6  . escitalopram (LEXAPRO) 10 MG tablet Take 10 mg by mouth daily.    . furosemide (LASIX) 20 MG tablet Take 20 mg by mouth daily.     [name] levothyroxine (SYNTHROID, LEVOTHROID) 25 MCG tablet Take 25 mcg by mouth daily before breakfast.    . lisinopril (PRINIVIL,ZESTRIL) 5 MG tablet TAKE 1 TABLET ONCE DAILY. 30 tablet 6  . metoprolol succinate (TOPROL-XL) 25 MG 24 hr tablet Take 25 mg by mouth daily.    . nitroGLYCERIN (NITROSTAT) 0.4 MG SL tablet Place 1 tablet (0.4 mg total) under the tongue every 5 (five) minutes as needed. 25 tablet 3  . omeprazole (PRILOSEC) 40 MG capsule Take 1 capsule (40 mg total) by  mouth daily. 30 capsule 5  . potassium chloride (K-DUR) 10 MEQ tablet Take 10 mEq by mouth daily.     No current facility-administered medications for this visit.    Past Medical History  Diagnosis Date  . CAD (coronary artery disease)     a. Prior history of BMS to OM and RCA, subsequent cutting balloon angioplasty due to ISR in these distributions, also BMS placement with DES to LAD. b. Cath 01/2013: diffuse 3V CAD without clear culprit lesion, normal LVEF, mid LAD lesion noted, treat medically. c. Cath 11/28/2013 DES to mid LAD lesion with abnormal FFR, DES to distal LAD lesion, DES to prox RCA lesion with abnormal FFR  . Hyperlipidemia   . GERD (gastroesophageal reflux disease)   . Abdominal pain, right upper quadrant   . Depression   . COPD (chronic obstructive pulmonary disease)   . Hypertension   . Sinus bradycardia   . Other and unspecified angina pectoris   . Kidney stones     "no surgeries"  . Macular degeneration of both eyes     "already blind in my left eye"  . Blind left eye   . Myocardial infarction ~ 2007   . History of blood transfusion     "when I was taking tx for hepatitis"  . Hepatitis C C    "had the tx and  I'm free of it"  . DDD (degenerative disc disease)   . Arthritis     "all my joints" (11/28/2013)  . Chronic lower back pain     Past Surgical History  Procedure Laterality Date  . Liver biopsy  03/24/2010  . Colonoscopy    . Colonoscopy N/A 05/02/2013    Procedure: COLONOSCOPY;  Surgeon: Rogene Houston, MD;  [facility];  Service: Endoscopy;  Laterality: N/A;  1030-moved to 1055 Ann to notify pt  . Inguinal hernia repair Bilateral ~ 2000  . Cardiac catheterization      "I've had a few without intervention"  . Coronary angioplasty with stent placement   2007; 2008; 11/28/2013    "2 + 2; + 3"  . Left heart catheterization with coronary angiogram N/A 01/30/2013    Procedure: LEFT HEART CATHETERIZATION WITH CORONARY ANGIOGRAM;  Surgeon:  Jolaine Artist, MD;  Location: [facility] CATH LAB;  Service: Cardiovascular;  Laterality: N/A;  . Left heart catheterization with coronary angiogram N/A 11/28/2013    Procedure: LEFT HEART CATHETERIZATION WITH CORONARY ANGIOGRAM;  Surgeon: Jettie Booze, MD;  Location: [facility] CATH LAB;  Service: Cardiovascular;  Laterality: N/A;    History   Social History  . Marital Status: Married    Spouse Name: N/A  . Number of Children: N/A  . Years of Education: N/A   Occupational History  . Not on file.   Social History Main Topics  . Smoking status: Former Smoker -- 1.50 packs/day for 35 years    Types: Cigarettes    Start date: 07/20/1971    Quit date: 02/27/2006  . Smokeless tobacco: Never Used  . Alcohol Use: No  . Drug Use: No     Comment: 11/28/2013 "did qthing in my teens and 20's; none since"  . Sexual Activity: Yes   Other Topics Concern  . Not on file   Social History Narrative     Filed Vitals:   01/29/15 1503  BP: 120/70  Pulse: 62  Height: 5\' 9"  (1.753 m)  Weight: 181 lb (82.101 kg)  SpO2: 95%    PHYSICAL EXAM General: NAD HEENT: Poor dentition. Neck: No JVD, no thyromegaly. Lungs: Clear to auscultation bilaterally with normal respiratory effort. CV: Nondisplaced PMI.  Regular rate and rhythm, normal S1/S2, no S3/S4, no murmur. No pretibial or periankle edema.  No carotid bruit.   Abdomen: Soft, nontender, no distention.  Neurologic: Alert and oriented x 3.  Psych: Normal affect. Skin: Normal. Musculoskeletal: No gross deformities. Extremities: No clubbing or cyanosis.   ECG: Most recent ECG reviewed.      ASSESSMENT AND PLAN: 1. CAD/preoperative risk stratification: Stable ischemic heart disease with multivessel stenting. Will obtain a Lexiscan Cardiolite for ischemic assessment given upcoming surgery for more accurate preoperative risk stratification. Has fatigue and shortness of breath when walking uphill. Continue aspirin, Plavix, metoprolol  succinate, and Lipitor.  2. Essential HTN: Well controlled on current therapy which includes lisinopril 5 mg daily. No changes.  3. Hyperlipidemia: Will obtain copy of lipid panel. He has hepatitis C. Continue atorvastatin 40 mg daily.   Dispo: f/u 6 months.   Kate Sable, M.D., F.A.C.C.

## 2015-02-05 ENCOUNTER — Encounter (HOSPITAL_COMMUNITY)
Admission: RE | Admit: 2015-02-05 | Discharge: 2015-02-05 | Disposition: A | Discharge status: Home / Self Care | Payer: Medicaid Other | Source: Ambulatory Visit | Attending: Cardiovascular Disease | Admitting: Cardiovascular Disease

## 2015-02-05 ENCOUNTER — Inpatient Hospital Stay (HOSPITAL_COMMUNITY): Admission: RE | Admit: 2015-02-05 | Payer: BLUE CROSS/BLUE SHIELD | Source: Ambulatory Visit

## 2015-02-05 ENCOUNTER — Encounter (HOSPITAL_COMMUNITY): Payer: Self-pay

## 2015-02-05 DIAGNOSIS — Z01818 Encounter for other preprocedural examination: Secondary | ICD-10-CM | POA: Diagnosis present

## 2015-02-05 DIAGNOSIS — R0602 Shortness of breath: Secondary | ICD-10-CM | POA: Insufficient documentation

## 2015-02-05 DIAGNOSIS — I25118 Atherosclerotic heart disease of native coronary artery with other forms of angina pectoris: Secondary | ICD-10-CM | POA: Insufficient documentation

## 2015-02-05 LAB — NM MYOCAR MULTI W/SPECT W/WALL MOTION / EF
CHL CUP NUCLEAR SDS: 4
CHL CUP RESTING HR STRESS: 57 {beats}/min
CSEPPHR: 88 {beats}/min
LV dias vol: 108 mL
LV sys vol: 57 mL
RATE: 0.31
SRS: 0
SSS: 4
TID: 1.11

## 2015-02-05 MED ORDER — TECHNETIUM TC 99M SESTAMIBI GENERIC - CARDIOLITE
10.0000 | Freq: Once | INTRAVENOUS | Status: AC | PRN
  Administered 2015-02-05: 10 via INTRAVENOUS

## 2015-02-05 MED ORDER — TECHNETIUM TC 99M SESTAMIBI - CARDIOLITE
30.0000 | Freq: Once | INTRAVENOUS | Status: AC | PRN
  Administered 2015-02-05: 11:00:00 30 via INTRAVENOUS

## 2015-02-05 MED ORDER — SODIUM CHLORIDE 0.9 % IJ SOLN
INTRAMUSCULAR | Status: AC
  Administered 2015-02-05: 10 mL via INTRAVENOUS
  Filled 2015-02-05: qty 3

## 2015-02-05 MED ORDER — REGADENOSON 0.4 MG/5ML IV SOLN
INTRAVENOUS | Status: AC
  Administered 2015-02-05: 0.4 mg via INTRAVENOUS
  Filled 2015-02-05: qty 5

## 2015-02-06 ENCOUNTER — Telehealth: Payer: Self-pay | Admitting: *Deleted

## 2015-02-06 NOTE — Telephone Encounter (Signed)
Called patient to give results. No answer. Left message on voicemail to call back

## 2015-02-06 NOTE — Telephone Encounter (Signed)
-----   Message from Herminio Commons, MD sent at 02/05/2015  4:12 PM EDT ----- Low risk study.

## 2015-03-13 ENCOUNTER — Encounter: Payer: Self-pay | Admitting: *Deleted

## 2015-05-27 ENCOUNTER — Other Ambulatory Visit: Payer: Self-pay | Admitting: *Deleted

## 2015-05-27 MED ORDER — NITROGLYCERIN 0.4 MG SL SUBL: 0.4000 mg | SUBLINGUAL_TABLET | SUBLINGUAL | Status: AC | PRN

## 2015-06-08 ENCOUNTER — Other Ambulatory Visit: Payer: Self-pay | Admitting: Cardiovascular Disease

## 2015-06-15 ENCOUNTER — Other Ambulatory Visit: Payer: Self-pay | Admitting: Cardiovascular Disease

## 2015-07-30 ENCOUNTER — Ambulatory Visit (INDEPENDENT_AMBULATORY_CARE_PROVIDER_SITE_OTHER): Payer: Medicaid Other | Admitting: Cardiovascular Disease

## 2015-07-30 ENCOUNTER — Encounter: Payer: Self-pay | Admitting: Cardiovascular Disease

## 2015-07-30 VITALS — BP 138/92 | HR 66 | Ht 69.0 in | Wt 192.0 lb

## 2015-07-30 DIAGNOSIS — I10 Essential (primary) hypertension: Secondary | ICD-10-CM | POA: Diagnosis not present

## 2015-07-30 DIAGNOSIS — I25118 Atherosclerotic heart disease of native coronary artery with other forms of angina pectoris: Secondary | ICD-10-CM

## 2015-07-30 DIAGNOSIS — E785 Hyperlipidemia, unspecified: Secondary | ICD-10-CM

## 2015-07-30 MED ORDER — ATORVASTATIN CALCIUM 80 MG PO TABS: 80.0000 mg | ORAL_TABLET | Freq: Every day | ORAL | Status: DC

## 2015-07-30 NOTE — Addendum Note (Signed)
Addended by: Laurine Blazer on: 07/30/2015 10:48 AM   Modules accepted: Orders

## 2015-07-30 NOTE — Patient Instructions (Signed)
   Increase Lipitor to 80mg  daily - printed script given today. Continue all other medications.   Lab for liver function - due in 1 month.  Office will contact with results via phone or letter.   Your physician wants you to follow up in: 6 months.  You will receive a reminder letter in the mail one-two months in advance.  If you don't receive a letter, please call our office to schedule the follow up appointment

## 2015-07-30 NOTE — Progress Notes (Signed)
Patient ID: Roy [name], male   DOB: [birth year], 60 y.o.   MRN: [id number]      SUBJECTIVE: The patient returns for follow-up of coronary artery disease with multivessel stenting, hypertension, and hyperlipidemia. Low risk nuclear stress test on 02/05/15 with no ischemia.  Denies chest pain. Still has some pain and soreness with regards to back surgery in 2016. Has some mild exertional dyspnea which is unchanged. Denies leg swelling.  Review of Systems: As per "subjective", otherwise negative.  Allergies  Allergen Reactions  . Codeine Itching  . Imdur [Isosorbide]     Has not tolerated well in the past  . Penicillins Other (See Comments)    Chest pain  . Food Rash    PEACHES    Current Outpatient Prescriptions  Medication Sig Dispense Refill  . amLODipine (NORVASC) 10 MG tablet TAKE 1 TABLET ONCE DAILY. 30 tablet 6  . aspirin 81 MG tablet Take 81 mg by mouth daily.      [name] atorvastatin (LIPITOR) 40 MG tablet TAKE (1) TABLET BY MOUTH ONCE DAILY. 30 tablet 6  . clopidogrel (PLAVIX) 75 MG tablet TAKE (1) TABLET BY MOUTH ONCE DAILY. 30 tablet 6  . escitalopram (LEXAPRO) 10 MG tablet Take 10 mg by mouth daily.    . furosemide (LASIX) 20 MG tablet Take 20 mg by mouth daily.     [name] gabapentin (NEURONTIN) 300 MG capsule Take 600 mg by mouth 3 (three) times daily.    [name] levothyroxine (SYNTHROID, LEVOTHROID) 25 MCG tablet Take 25 mcg by mouth daily before breakfast.    . lisinopril (PRINIVIL,ZESTRIL) 5 MG tablet TAKE 1 TABLET ONCE DAILY. 30 tablet 6  . metoprolol succinate (TOPROL-XL) 25 MG 24 hr tablet Take 25 mg by mouth daily.    . nitroGLYCERIN (NITROSTAT) 0.4 MG SL tablet Place 1 tablet (0.4 mg total) under the tongue every 5 (five) minutes x 3 doses as needed. 25 tablet 3  . omeprazole (PRILOSEC) 40 MG capsule Take 1 capsule (40 mg total) by mouth daily. 30 capsule 5  . potassium chloride (K-DUR) 10 MEQ tablet Take 10 mEq by mouth daily.     No current facility-administered  medications for this visit.    Past Medical History  Diagnosis Date  . CAD (coronary artery disease)     a. Prior history of BMS to OM and RCA, subsequent cutting balloon angioplasty due to ISR in these distributions, also BMS placement with DES to LAD. b. Cath 01/2013: diffuse 3V CAD without clear culprit lesion, normal LVEF, mid LAD lesion noted, treat medically. c. Cath 11/28/2013 DES to mid LAD lesion with abnormal FFR, DES to distal LAD lesion, DES to prox RCA lesion with abnormal FFR  . Hyperlipidemia   . GERD (gastroesophageal reflux disease)   . Abdominal pain, right upper quadrant   . Depression   . COPD (chronic obstructive pulmonary disease) ([place])   . Hypertension   . Sinus bradycardia   . Other and unspecified angina pectoris   . Kidney stones     "no surgeries"  . Macular degeneration of both eyes     "already blind in my left eye"  . Blind left eye   . Myocardial infarction ([place]) ~ 2007   . History of blood transfusion     "when I was taking tx for hepatitis"  . Hepatitis C C    "had the tx and I'm free of it"  . DDD (degenerative disc disease)   . Arthritis     "  all my joints" (11/28/2013)  . Chronic lower back pain     Past Surgical History  Procedure Laterality Date  . Liver biopsy  03/24/2010  . Colonoscopy    . Colonoscopy N/A 05/02/2013    Procedure: COLONOSCOPY;  Surgeon: Rogene Houston, MD;  [facility];  Service: Endoscopy;  Laterality: N/A;  1030-moved to 1055 Ann to notify pt  . Inguinal hernia repair Bilateral ~ 2000  . Cardiac catheterization      "I've had a few without intervention"  . Coronary angioplasty with stent placement   2007; 2008; 11/28/2013    "2 + 2; + 3"  . Left heart catheterization with coronary angiogram N/A 01/30/2013    Procedure: LEFT HEART CATHETERIZATION WITH CORONARY ANGIOGRAM;  Surgeon: Jolaine Artist, MD;  Location: [facility] CATH LAB;  Service: Cardiovascular;  Laterality: N/A;  . Left heart catheterization with  coronary angiogram N/A 11/28/2013    Procedure: LEFT HEART CATHETERIZATION WITH CORONARY ANGIOGRAM;  Surgeon: Jettie Booze, MD;  Location: [facility] CATH LAB;  Service: Cardiovascular;  Laterality: N/A;    Social History   Social History  . Marital Status: Legally Separated    Spouse Name: N/A  . Number of Children: N/A  . Years of Education: N/A   Occupational History  . Not on file.   Social History Main Topics  . Smoking status: Former Smoker -- 1.50 packs/day for 35 years    Types: Cigarettes    Start date: 07/20/1971    Quit date: 02/27/2006  . Smokeless tobacco: Never Used  . Alcohol Use: No  . Drug Use: No     Comment: 11/28/2013 "did qthing in my teens and 20's; none since"  . Sexual Activity: Yes   Other Topics Concern  . Not on file   Social History Narrative     Filed Vitals:   07/30/15 1016  BP: 138/92  Pulse: 66  Height: 5\' 9"  (1.753 m)  Weight: 192 lb (87.091 kg)  SpO2: 98%    PHYSICAL EXAM General: NAD HEENT: Poor dentition. Neck: No JVD, no thyromegaly. Lungs: Clear to auscultation bilaterally with normal respiratory effort. CV: Nondisplaced PMI.  Regular rate and rhythm, normal S1/S2, no S3/S4, no murmur. No pretibial or periankle edema.  No carotid bruit.   Abdomen: Soft, nontender, no distention.  Neurologic: Alert and oriented x 3.  Psych: Normal affect. Skin: Normal. Musculoskeletal: No gross deformities. Extremities: No clubbing or cyanosis.   ECG: Most recent ECG reviewed.      ASSESSMENT AND PLAN: 1. CAD/preoperative risk stratification: Stable ischemic heart disease with multivessel stenting. Low risk nuclear stress test on 02/05/15 with no ischemia.  Continue aspirin, Plavix, metoprolol succinate, and Lipitor (increase to 80 mg).  2. Essential HTN: Mild elevation of DBP on current therapy which includes lisinopril 5 mg daily. No changes.  3. Hyperlipidemia: LDL 110 on 11/11/14. Has hep C. Will increase Lipitor to 80 mg and check  LFT's in one month.  Dispo: f/u 6 months.  Kate Sable, M.D., F.A.C.C.

## 2015-08-20 ENCOUNTER — Other Ambulatory Visit (INDEPENDENT_AMBULATORY_CARE_PROVIDER_SITE_OTHER): Payer: Self-pay | Admitting: Internal Medicine

## 2015-09-11 ENCOUNTER — Encounter: Payer: Self-pay | Admitting: *Deleted

## 2015-09-24 ENCOUNTER — Encounter: Payer: Self-pay | Admitting: Cardiovascular Disease

## 2015-09-24 ENCOUNTER — Ambulatory Visit (INDEPENDENT_AMBULATORY_CARE_PROVIDER_SITE_OTHER): Payer: Medicaid Other | Admitting: Cardiovascular Disease

## 2015-09-24 VITALS — BP 120/80 | HR 65 | Ht 69.0 in | Wt 176.0 lb

## 2015-09-24 DIAGNOSIS — R55 Syncope and collapse: Secondary | ICD-10-CM | POA: Diagnosis not present

## 2015-09-24 DIAGNOSIS — E785 Hyperlipidemia, unspecified: Secondary | ICD-10-CM

## 2015-09-24 DIAGNOSIS — I10 Essential (primary) hypertension: Secondary | ICD-10-CM

## 2015-09-24 DIAGNOSIS — I25118 Atherosclerotic heart disease of native coronary artery with other forms of angina pectoris: Secondary | ICD-10-CM

## 2015-09-24 DIAGNOSIS — Z955 Presence of coronary angioplasty implant and graft: Secondary | ICD-10-CM

## 2015-09-24 DIAGNOSIS — I209 Angina pectoris, unspecified: Secondary | ICD-10-CM

## 2015-09-24 MED ORDER — METOPROLOL SUCCINATE ER 25 MG PO TB24: 12.5000 mg | ORAL_TABLET | Freq: Every day | ORAL | Status: DC

## 2015-09-24 NOTE — Patient Instructions (Signed)
Your physician has recommended that you wear a 30 day event monitor. Event monitors are medical devices that record the heart's electrical activity. Doctors most often [place] these monitors to diagnose arrhythmias. Arrhythmias are problems with the speed or rhythm of the heartbeat. The monitor is a small, portable device. You can wear one while you do your normal daily activities. This is usually used to diagnose what is causing palpitations/syncope (passing out). Labs for CBC, BMET - orders given today. Office will contact with results via phone or letter.   Decrease Toprol XL to 12.5mg  daily  Continue all other medications.   Follow up in  6 weeks

## 2015-09-24 NOTE — Progress Notes (Signed)
Patient ID: Roy [name], male   DOB: 1956/04/07, 60 y.o.   MRN: [id number]      SUBJECTIVE: The patient presents for evaluation of syncope. When I saw him on 07/30/15 he was doing well and was symptomatically stable. He has a history of coronary artery disease with multivessel stenting, hypertension, and hyperlipidemia. Low risk nuclear stress test on 02/05/15 with no ischemia.  He tells me he has had 3 or 4 episodes of chest pain in the past few weeks, each episode lasting 2-3 minutes but quickly subsides in less than a minute if one sublingual nitroglycerin tablet is taken. He denies headaches. He describes an episode in September 2016 of passing out after watching TV. He had been standing with a friend and then sat down and is uncertain how long he passed out for. He said when he had his stitches out after back surgery he nearly passed out.   Most recently, he had been eating in a restaurant and having a sandwich and drinking tea and felt a "cloud come over him". He went out to his truck and then passed out. The next day he felt sick and stayed in bed but has felt well ever since then. He has occasional episodes of shortness of breath lasting 20-30 seconds.  Review of Systems: As per "subjective", otherwise negative.  Allergies  Allergen Reactions  . Codeine Itching  . Imdur [Isosorbide Nitrate]     Has not tolerated well in the past  . Penicillins Other (See Comments)    Chest pain  . Food Rash    PEACHES    Current Outpatient Prescriptions  Medication Sig Dispense Refill  . amLODipine (NORVASC) 10 MG tablet TAKE 1 TABLET ONCE DAILY. 30 tablet 6  . aspirin 81 MG tablet Take 81 mg by mouth daily.      [name] atorvastatin (LIPITOR) 40 MG tablet Take 80 mg by mouth daily.     . clopidogrel (PLAVIX) 75 MG tablet TAKE (1) TABLET BY MOUTH ONCE DAILY. 30 tablet 6  . dexlansoprazole (DEXILANT) 60 MG capsule Take 60 mg by mouth daily.    [name] escitalopram (LEXAPRO) 10 MG tablet Take 10 mg by mouth  daily.    . furosemide (LASIX) 20 MG tablet Take 20 mg by mouth daily.     [name] gabapentin (NEURONTIN) 300 MG capsule Take 600 mg by mouth 3 (three) times daily.    [name] levothyroxine (SYNTHROID, LEVOTHROID) 25 MCG tablet Take 25 mcg by mouth daily before breakfast.    . lisinopril (PRINIVIL,ZESTRIL) 5 MG tablet TAKE 1 TABLET ONCE DAILY. 30 tablet 6  . metoprolol succinate (TOPROL-XL) 25 MG 24 hr tablet Take 25 mg by mouth daily.    . nitroGLYCERIN (NITROSTAT) 0.4 MG SL tablet Place 1 tablet (0.4 mg total) under the tongue every 5 (five) minutes x 3 doses as needed. 25 tablet 3  . oxyCODONE-acetaminophen (PERCOCET/ROXICET) 5-325 MG tablet Take 1-2 tablets by mouth every 6 (six) hours as needed for severe pain.    . potassium chloride (K-DUR) 10 MEQ tablet Take 10 mEq by mouth daily.     No current facility-administered medications for this visit.    Past Medical History  Diagnosis Date  . CAD (coronary artery disease)     a. Prior history of BMS to OM and RCA, subsequent cutting balloon angioplasty due to ISR in these distributions, also BMS placement with DES to LAD. b. Cath 01/2013: diffuse 3V CAD without clear culprit lesion, normal LVEF, mid LAD  lesion noted, treat medically. c. Cath 11/28/2013 DES to mid LAD lesion with abnormal FFR, DES to distal LAD lesion, DES to prox RCA lesion with abnormal FFR  . Hyperlipidemia   . GERD (gastroesophageal reflux disease)   . Abdominal pain, right upper quadrant   . Depression   . COPD (chronic obstructive pulmonary disease) ([place])   . Hypertension   . Sinus bradycardia   . Other and unspecified angina pectoris   . Kidney stones     "no surgeries"  . Macular degeneration of both eyes     "already blind in my left eye"  . Blind left eye   . Myocardial infarction ([place]) ~ 2007   . History of blood transfusion     "when I was taking tx for hepatitis"  . Hepatitis C C    "had the tx and I'm free of it"  . DDD (degenerative disc disease)   . Arthritis       "all my joints" (11/28/2013)  . Chronic lower back pain     Past Surgical History  Procedure Laterality Date  . Liver biopsy  03/24/2010  . Colonoscopy    . Colonoscopy N/A 05/02/2013    Procedure: COLONOSCOPY;  Surgeon: Rogene Houston, MD;  [facility];  Service: Endoscopy;  Laterality: N/A;  1030-moved to 1055 Ann to notify pt  . Inguinal hernia repair Bilateral ~ 2000  . Cardiac catheterization      "I've had a few without intervention"  . Coronary angioplasty with stent placement   2007; 2008; 11/28/2013    "2 + 2; + 3"  . Left heart catheterization with coronary angiogram N/A 01/30/2013    Procedure: LEFT HEART CATHETERIZATION WITH CORONARY ANGIOGRAM;  Surgeon: Jolaine Artist, MD;  Location[facility] CATH LAB;  Service: Cardiovascular;  Laterality: N/A;  . Left heart catheterization with coronary angiogram N/A 11/28/2013    Procedure: LEFT HEART CATHETERIZATION WITH CORONARY ANGIOGRAM;  Surgeon: Jettie Booze, MD;  Location: [facility] CATH LAB;  Service: Cardiovascular;  Laterality: N/A;    Social History   Social History  . Marital Status: Legally Separated    Spouse Name: N/A  . Number of Children: N/A  . Years of Education: N/A   Occupational History  . Not on file.   Social History Main Topics  . Smoking status: Former Smoker -- 1.50 packs/day for 35 years    Types: Cigarettes    Start date: 07/20/1971    Quit date: 02/27/2006  . Smokeless tobacco: Never Used  . Alcohol Use: No  . Drug Use: No     Comment: 11/28/2013 "did qthing in my teens and 20's; none since"  . Sexual Activity: Yes   Other Topics Concern  . Not on file   Social History Narrative     Filed Vitals:   09/24/15 0942 09/24/15 0943 09/24/15 0945 09/24/15 0948  BP: 135/79 143/74 142/81 120/80  Pulse: 59 60 62 65  Height:      Weight:      SpO2:    99%    PHYSICAL EXAM General: NAD HEENT: Poor dentition. Neck: No JVD, no thyromegaly. Lungs: Clear to auscultation bilaterally  with normal respiratory effort. CV: Nondisplaced PMI. Regular rate and rhythm, normal S1/S2, no S3/S4, no murmur. No pretibial or periankle edema. No carotid bruit.  Abdomen: Soft, nontender, no distention.  Neurologic: Alert and oriented x 3.  Psych: Normal affect. Skin: Normal. Musculoskeletal: No gross deformities. Extremities: No clubbing or cyanosis.  ECG: Most recent ECG reviewed.      ASSESSMENT AND PLAN: 1. Syncope and chest pain in context of CAD: Has a history of multivessel stenting. Low risk nuclear stress test on 02/05/15 with no ischemia.  Continue aspirin, Plavix, metoprolol succinate (reduce to 12.5 mg daily), and Lipitor. Will obtain event monitor but would not rule out the possibility of coronary angiography.   2. Essential HTN: Mild elevation on current therapy which includes lisinopril 5 mg daily. No changes.  3. Hyperlipidemia: LDL 110 on 11/11/14. Has hep C. Will increase Lipitor to 80 mg and check LFT's in one month.  4. Sycnope: Normal orthostatics. Will obtain a 30 day event monitor, but if this is normal I would pursue coronary angiography. Would also consider an implantable loop recorder if the aforementioned studies are normal. Will obtain basic metabolic panel and CBC.  Dispo: f/u 6 weeks.   Kate Sable, M.D., F.A.C.C.

## 2015-09-30 ENCOUNTER — Ambulatory Visit (INDEPENDENT_AMBULATORY_CARE_PROVIDER_SITE_OTHER): Payer: Medicaid Other

## 2015-09-30 DIAGNOSIS — R55 Syncope and collapse: Secondary | ICD-10-CM

## 2015-10-01 ENCOUNTER — Encounter: Payer: Self-pay | Admitting: *Deleted

## 2015-10-15 ENCOUNTER — Encounter: Payer: Self-pay | Admitting: *Deleted

## 2015-11-03 ENCOUNTER — Telehealth: Payer: Self-pay | Admitting: *Deleted

## 2015-11-03 NOTE — Telephone Encounter (Signed)
Patient came into clinic to return call - asked that you call back

## 2015-11-03 NOTE — Telephone Encounter (Signed)
-----   Message from [place] sent at 11/02/2015  9:15 AM EDT -----   ----- Message -----    From: Herminio Commons, MD    Sent: 11/02/2015   9:07 AM      To: Massie Maroon, CMA  Normal.

## 2015-11-03 NOTE — Telephone Encounter (Signed)
Notes Recorded by Laurine Blazer, LPN on [id number] at [phone] AM Left message to return call.

## 2015-11-03 NOTE — Telephone Encounter (Signed)
LM on VM of normal results and to call with any questions.

## 2015-11-09 ENCOUNTER — Ambulatory Visit (INDEPENDENT_AMBULATORY_CARE_PROVIDER_SITE_OTHER): Payer: Medicaid Other | Admitting: Cardiovascular Disease

## 2015-11-09 ENCOUNTER — Encounter: Payer: Self-pay | Admitting: Cardiovascular Disease

## 2015-11-09 VITALS — BP 113/74 | HR 87 | Ht 69.0 in | Wt 179.0 lb

## 2015-11-09 DIAGNOSIS — I209 Angina pectoris, unspecified: Secondary | ICD-10-CM | POA: Diagnosis not present

## 2015-11-09 DIAGNOSIS — I25118 Atherosclerotic heart disease of native coronary artery with other forms of angina pectoris: Secondary | ICD-10-CM | POA: Diagnosis not present

## 2015-11-09 DIAGNOSIS — I10 Essential (primary) hypertension: Secondary | ICD-10-CM

## 2015-11-09 DIAGNOSIS — R55 Syncope and collapse: Secondary | ICD-10-CM

## 2015-11-09 DIAGNOSIS — Z955 Presence of coronary angioplasty implant and graft: Secondary | ICD-10-CM

## 2015-11-09 DIAGNOSIS — E785 Hyperlipidemia, unspecified: Secondary | ICD-10-CM

## 2015-11-09 DIAGNOSIS — K759 Inflammatory liver disease, unspecified: Secondary | ICD-10-CM

## 2015-11-09 NOTE — Progress Notes (Signed)
Patient ID: Roy [name], male   DOB: September 27, 1955, 60 y.o.   MRN: [id number]      SUBJECTIVE: The patient returns for follow-up after undergoing cardiovascular testing performed for the evaluation of syncope. Event monitoring demonstrated sinus rhythm with occasional sinus bradycardia which was insignificant. There were no sinus pauses.  He has a history of coronary artery disease with multivessel stenting, hypertension, and hyperlipidemia. Low risk nuclear stress test on 02/05/15 with no ischemia.  There were no significant abnormalities on basic metabolic panel or CBC.  He has not had any recurrences of syncope nor chest pain.    Review of Systems: As per "subjective", otherwise negative.  Allergies  Allergen Reactions  . Codeine Itching  . Imdur [Isosorbide Nitrate]     Has not tolerated well in the past  . Penicillins Other (See Comments)    Chest pain  . Food Rash    PEACHES    Current Outpatient Prescriptions  Medication Sig Dispense Refill  . amLODipine (NORVASC) 10 MG tablet TAKE 1 TABLET ONCE DAILY. 30 tablet 6  . aspirin 81 MG tablet Take 81 mg by mouth daily.      [name] atorvastatin (LIPITOR) 40 MG tablet Take 40 mg by mouth daily.     . clopidogrel (PLAVIX) 75 MG tablet TAKE (1) TABLET BY MOUTH ONCE DAILY. 30 tablet 6  . escitalopram (LEXAPRO) 10 MG tablet Take 10 mg by mouth daily.    . furosemide (LASIX) 20 MG tablet Take 20 mg by mouth daily.     [name] gabapentin (NEURONTIN) 300 MG capsule Take 300 mg by mouth 3 (three) times daily.     [name] levothyroxine (SYNTHROID, LEVOTHROID) 50 MCG tablet Take 50 mcg by mouth daily before breakfast.    . metoprolol succinate (TOPROL-XL) 25 MG 24 hr tablet Take 0.5 tablets (12.5 mg total) by mouth daily.    . nitroGLYCERIN (NITROSTAT) 0.4 MG SL tablet Place 1 tablet (0.4 mg total) under the tongue every 5 (five) minutes x 3 doses as needed. 25 tablet 3  . omeprazole (PRILOSEC) 40 MG capsule Take 40 mg by mouth daily.    [name]  oxyCODONE-acetaminophen (PERCOCET/ROXICET) 5-325 MG tablet Take 1-2 tablets by mouth every 6 (six) hours as needed for severe pain.    . potassium chloride (K-DUR) 10 MEQ tablet Take 10 mEq by mouth daily.     No current facility-administered medications for this visit.    Past Medical History  Diagnosis Date  . CAD (coronary artery disease)     a. Prior history of BMS to OM and RCA, subsequent cutting balloon angioplasty due to ISR in these distributions, also BMS placement with DES to LAD. b. Cath 01/2013: diffuse 3V CAD without clear culprit lesion, normal LVEF, mid LAD lesion noted, treat medically. c. Cath 11/28/2013 DES to mid LAD lesion with abnormal FFR, DES to distal LAD lesion, DES to prox RCA lesion with abnormal FFR  . Hyperlipidemia   . GERD (gastroesophageal reflux disease)   . Abdominal pain, right upper quadrant   . Depression   . COPD (chronic obstructive pulmonary disease) ([place])   . Hypertension   . Sinus bradycardia   . Other and unspecified angina pectoris   . Kidney stones     "no surgeries"  . Macular degeneration of both eyes     "already blind in my left eye"  . Blind left eye   . Myocardial infarction ([place]) ~ 2007   . History of blood transfusion     "  when I was taking tx for hepatitis"  . Hepatitis C C    "had the tx and I'm free of it"  . DDD (degenerative disc disease)   . Arthritis     "all my joints" (11/28/2013)  . Chronic lower back pain     Past Surgical History  Procedure Laterality Date  . Liver biopsy  03/24/2010  . Colonoscopy    . Colonoscopy N/A 05/02/2013    Procedure: COLONOSCOPY;  Surgeon: Rogene Houston, MD;  Location: [facility];  Service: Endoscopy;  Laterality: N/A;  1030-moved to 1055 Ann to notify pt  . Inguinal hernia repair Bilateral ~ 2000  . Cardiac catheterization      "I've had a few without intervention"  . Coronary angioplasty with stent placement   2007; 2008; 11/28/2013    "2 + 2; + 3"  . Left heart catheterization  with coronary angiogram N/A 01/30/2013    Procedure: LEFT HEART CATHETERIZATION WITH CORONARY ANGIOGRAM;  Surgeon: Jolaine Artist, MD;  Location: [facility] CATH LAB;  Service: Cardiovascular;  Laterality: N/A;  . Left heart catheterization with coronary angiogram N/A 11/28/2013    Procedure: LEFT HEART CATHETERIZATION WITH CORONARY ANGIOGRAM;  Surgeon: Jettie Booze, MD;  Location: [facility] CATH LAB;  Service: Cardiovascular;  Laterality: N/A;    Social History   Social History  . Marital Status: Legally Separated    Spouse Name: N/A  . Number of Children: N/A  . Years of Education: N/A   Occupational History  . Not on file.   Social History Main Topics  . Smoking status: Former Smoker -- 1.50 packs/day for 35 years    Types: Cigarettes    Start date: 07/20/1971    Quit date: 02/27/2006  . Smokeless tobacco: Never Used  . Alcohol Use: No  . Drug Use: No     Comment: 11/28/2013 "did qthing in my teens and 20's; none since"  . Sexual Activity: Yes   Other Topics Concern  . Not on file   Social History Narrative     Filed Vitals:   11/09/15 1114  BP: 113/74  Pulse: 87  Height: 5\' 9"  (1.753 m)  Weight: 179 lb (81.194 kg)    PHYSICAL EXAM General: NAD HEENT: Normal. Neck: No JVD, no thyromegaly. Lungs: Bilateral rhonchi, no rales. CV: Nondisplaced PMI.  Regular rate and rhythm, normal S1/S2, no S3/S4, no murmur. No pretibial or periankle edema.    Abdomen: Soft, nontender, no distention.  Neurologic: Alert and oriented.  Psych: Normal affect. Skin: Normal. Musculoskeletal: No gross deformities.    ECG: Most recent ECG reviewed.      ASSESSMENT AND PLAN: 1. Syncope and chest pain in context of CAD: No recurrences. Has a history of multivessel stenting. Low risk nuclear stress test on 02/05/15 with no ischemia.  Continue aspirin, Plavix, metoprolol succinate, and Lipitor. If he has recurrences, I would not rule out the possibility of coronary angiography.  2.  Essential HTN: No longer on lisinopril 5 mg daily. Controlled today, elevated at last visit. No changes.  3. Hyperlipidemia: LDL 110 on 11/11/14. Has hep C. Continue Lipitor 80 mg. Transaminases normal in 10/2015. Will repeat in 3 months.  4. Syncope: No recurrences. Event monitoring demonstrated sinus rhythm with occasional sinus bradycardia which was insignificant. There were no sinus pauses. If he has recurrences, I would not rule out the possibility of coronary angiography and would consider an implantable loop recorder.  Dispo: f/u 3 months.  Kate Sable, M.D., F.A.C.C.

## 2015-11-09 NOTE — Patient Instructions (Signed)
Continue all current medications. Follow up in  3 months 

## 2016-01-07 ENCOUNTER — Other Ambulatory Visit: Payer: Self-pay | Admitting: Cardiovascular Disease

## 2016-02-01 ENCOUNTER — Other Ambulatory Visit: Payer: Self-pay | Admitting: Cardiovascular Disease

## 2016-02-08 ENCOUNTER — Ambulatory Visit: Payer: Medicaid Other | Admitting: Cardiovascular Disease

## 2016-02-23 ENCOUNTER — Encounter (INDEPENDENT_AMBULATORY_CARE_PROVIDER_SITE_OTHER): Payer: Self-pay | Admitting: Ophthalmology

## 2016-03-17 ENCOUNTER — Ambulatory Visit: Payer: Medicaid Other | Admitting: Cardiovascular Disease

## 2016-03-21 ENCOUNTER — Encounter (INDEPENDENT_AMBULATORY_CARE_PROVIDER_SITE_OTHER): Payer: Medicaid Other | Admitting: Ophthalmology

## 2016-03-21 DIAGNOSIS — H35033 Hypertensive retinopathy, bilateral: Secondary | ICD-10-CM

## 2016-03-21 DIAGNOSIS — H34832 Tributary (branch) retinal vein occlusion, left eye, with macular edema: Secondary | ICD-10-CM

## 2016-03-21 DIAGNOSIS — H4311 Vitreous hemorrhage, right eye: Secondary | ICD-10-CM

## 2016-03-21 DIAGNOSIS — H353122 Nonexudative age-related macular degeneration, left eye, intermediate dry stage: Secondary | ICD-10-CM

## 2016-03-21 DIAGNOSIS — H34811 Central retinal vein occlusion, right eye, with macular edema: Secondary | ICD-10-CM

## 2016-03-21 DIAGNOSIS — H43813 Vitreous degeneration, bilateral: Secondary | ICD-10-CM

## 2016-03-21 DIAGNOSIS — I10 Essential (primary) hypertension: Secondary | ICD-10-CM | POA: Diagnosis not present

## 2016-04-15 ENCOUNTER — Ambulatory Visit (INDEPENDENT_AMBULATORY_CARE_PROVIDER_SITE_OTHER): Payer: Medicaid Other | Admitting: Cardiovascular Disease

## 2016-04-15 ENCOUNTER — Encounter: Payer: Self-pay | Admitting: Cardiovascular Disease

## 2016-04-15 VITALS — BP 150/80 | HR 52 | Ht 69.0 in | Wt 182.2 lb

## 2016-04-15 DIAGNOSIS — I2089 Other forms of angina pectoris: Secondary | ICD-10-CM

## 2016-04-15 DIAGNOSIS — I208 Other forms of angina pectoris: Secondary | ICD-10-CM

## 2016-04-15 DIAGNOSIS — E782 Mixed hyperlipidemia: Secondary | ICD-10-CM

## 2016-04-15 DIAGNOSIS — I25118 Atherosclerotic heart disease of native coronary artery with other forms of angina pectoris: Secondary | ICD-10-CM

## 2016-04-15 DIAGNOSIS — Z955 Presence of coronary angioplasty implant and graft: Secondary | ICD-10-CM

## 2016-04-15 DIAGNOSIS — I10 Essential (primary) hypertension: Secondary | ICD-10-CM | POA: Diagnosis not present

## 2016-04-15 DIAGNOSIS — E7800 Pure hypercholesterolemia, unspecified: Secondary | ICD-10-CM

## 2016-04-15 DIAGNOSIS — R55 Syncope and collapse: Secondary | ICD-10-CM

## 2016-04-15 DIAGNOSIS — K759 Inflammatory liver disease, unspecified: Secondary | ICD-10-CM

## 2016-04-15 NOTE — Patient Instructions (Addendum)
Medication Instructions:  Continue all current medications.  Labwork:  Lipids - order given today.   Reminder:  Nothing to eat or drink after 12 midnight prior to labs.    Office will contact with results via phone or letter.    Testing/Procedures: none  Follow-Up: Your physician wants you to follow up in: 6 months.  You will receive a reminder letter in the mail one-two months in advance.  If you don't receive a letter, please call our office to schedule the follow up appointment   Any Other Special Instructions Will Be Listed Below (If Applicable).  If you need a refill on your cardiac medications before your next appointment, please call your pharmacy.  

## 2016-04-15 NOTE — Progress Notes (Signed)
SUBJECTIVE: The patient returns for follow-up after undergoing cardiovascular testing performed for the evaluation of syncope.  Event monitoring demonstrated sinus rhythm with occasional sinus bradycardia which was insignificant. There were no sinus pauses.  He has a history of coronary artery disease with multivessel stenting, hypertension, and hyperlipidemia. Low risk nuclear stress test on 02/05/15 with no ischemia.  He has not had any recurrences of syncope.  He had one episode of chest pain which was retrosternal and radiated into his neck which he felt was due to indigestion but was relieved with one nitroglycerin tablet. He has occasional chest pains lasting 2-3 seconds. He has not taken his medications today.  Review of Systems: As per "subjective", otherwise negative.  Allergies  Allergen Reactions  . Codeine Itching  . Imdur [Isosorbide Nitrate]     Has not tolerated well in the past  . Penicillins Other (See Comments)    Chest pain  . Food Rash    PEACHES    Current Outpatient Prescriptions  Medication Sig Dispense Refill  . aspirin 81 MG tablet Take 81 mg by mouth daily.      [name] atorvastatin (LIPITOR) 40 MG tablet Take 40 mg by mouth daily.     . clopidogrel (PLAVIX) 75 MG tablet TAKE (1) TABLET BY MOUTH ONCE DAILY. 30 tablet 6  . escitalopram (LEXAPRO) 10 MG tablet Take 10 mg by mouth daily.    . furosemide (LASIX) 20 MG tablet Take 20 mg by mouth daily.     [name] gabapentin (NEURONTIN) 300 MG capsule Take 300 mg by mouth 3 (three) times daily.     [name] levothyroxine (SYNTHROID, LEVOTHROID) 50 MCG tablet Take 50 mcg by mouth daily before breakfast.    . metoprolol succinate (TOPROL-XL) 25 MG 24 hr tablet Take 0.5 tablets (12.5 mg total) by mouth daily. 30 tablet 3  . nitroGLYCERIN (NITROSTAT) 0.4 MG SL tablet Place 1 tablet (0.4 mg total) under the tongue every 5 (five) minutes x 3 doses as needed. 25 tablet 3  . omeprazole (PRILOSEC) 40 MG capsule Take 40 mg by mouth  daily.    [name] oxyCODONE-acetaminophen (PERCOCET) 10-325 MG tablet Take 1 tablet by mouth every 6 (six) hours as needed for pain.    . potassium chloride (K-DUR) 10 MEQ tablet Take 10 mEq by mouth daily.     No current facility-administered medications for this visit.     Past Medical History:  Diagnosis Date  . Abdominal pain, right upper quadrant   . Arthritis    "all my joints" (11/28/2013)  . Blind left eye   . CAD (coronary artery disease)    a. Prior history of BMS to OM and RCA, subsequent cutting balloon angioplasty due to ISR in these distributions, also BMS placement with DES to LAD. b. Cath 01/2013: diffuse 3V CAD without clear culprit lesion, normal LVEF, mid LAD lesion noted, treat medically. c. Cath 11/28/2013 DES to mid LAD lesion with abnormal FFR, DES to distal LAD lesion, DES to prox RCA lesion with abnormal FFR  . Chronic lower back pain   . COPD (chronic obstructive pulmonary disease) ([place])   . DDD (degenerative disc disease)   . Depression   . GERD (gastroesophageal reflux disease)   . Hepatitis C C   "had the tx and I'm free of it"  . History of blood transfusion    "when I was taking tx for hepatitis"  . Hyperlipidemia   . Hypertension   . Kidney stones    "  no surgeries"  . Macular degeneration of both eyes    "already blind in my left eye"  . Myocardial infarction ~ 2007   . Other and unspecified angina pectoris   . Sinus bradycardia     Past Surgical History:  Procedure Laterality Date  . CARDIAC CATHETERIZATION     "I've had a few without intervention"  . COLONOSCOPY    . COLONOSCOPY N/A 05/02/2013   Procedure: COLONOSCOPY;  Surgeon: Rogene Houston, MD;  Location: [facility];  Service: Endoscopy;  Laterality: N/A;  1030-moved to 1055 Ann to notify pt  . CORONARY ANGIOPLASTY WITH STENT PLACEMENT   2007; 2008; 11/28/2013   "2 + 2; + 3"  . INGUINAL HERNIA REPAIR Bilateral ~ 2000  . LEFT HEART CATHETERIZATION WITH CORONARY ANGIOGRAM N/A 01/30/2013    Procedure: LEFT HEART CATHETERIZATION WITH CORONARY ANGIOGRAM;  Surgeon: Jolaine Artist, MD;  Location: [facility] CATH LAB;  Service: Cardiovascular;  Laterality: N/A;  . LEFT HEART CATHETERIZATION WITH CORONARY ANGIOGRAM N/A 11/28/2013   Procedure: LEFT HEART CATHETERIZATION WITH CORONARY ANGIOGRAM;  Surgeon: Jettie Booze, MD;  Location: [facility] CATH LAB;  Service: Cardiovascular;  Laterality: N/A;  . LIVER BIOPSY  03/24/2010    Social History   Social History  . Marital status: Legally Separated    Spouse name: N/A  . Number of children: N/A  . Years of education: N/A   Occupational History  . Not on file.   Social History Main Topics  . Smoking status: Former Smoker    Packs/day: 1.50    Years: 35.00    Types: Cigarettes    Start date: 07/20/1971    Quit date: 02/27/2006  . Smokeless tobacco: Never Used  . Alcohol use No  . Drug use: No     Comment: 11/28/2013 "did qthing in my teens and 20's; none since"  . Sexual activity: Yes   Other Topics Concern  . Not on file   Social History Narrative  . No narrative on file     Vitals:   04/15/16 1323  BP: (!) 150/80  Pulse: (!) 52  SpO2: 97%  Weight: 182 lb 3.2 oz (82.6 kg)  Height: 5\' 9"  (1.753 m)    PHYSICAL EXAM General: NAD HEENT: Poor dentition Neck: No JVD, no thyromegaly. Lungs: Clear to auscultation bilaterally with normal respiratory effort. CV: Nondisplaced PMI.  Bradycardic, regular rhythm, normal S1/S2, no S3/S4, no murmur. No pretibial or periankle edema.  No carotid bruit.   Abdomen: Soft, nontender, no distention.  Neurologic: Alert and oriented.  Psych: Normal affect. Skin: Normal. Musculoskeletal: No gross deformities.    ECG: Most recent ECG reviewed.      ASSESSMENT AND PLAN: 1. Syncope and chest pain in context of CAD: No recurrences. Has a history of multivessel stenting. Low risk nuclear stress test on 02/05/15 with no ischemia.  Continue aspirin, Plavix, metoprolol succinate, and  Lipitor. If he has recurrences, I would not rule out the possibility of coronary angiography.  2. Essential HTN: Elevated. Did not take meds today and recently had coffee. Will monitor.  3. Hyperlipidemia: LDL 110 on 11/11/14. Has hep C. Continue Lipitor 80 mg. Transaminases normal in 10/2015. Will repeat lipids.  4. Syncope: No recurrences. Event monitoring demonstrated sinus rhythm with occasional sinus bradycardia which was insignificant. There were no sinus pauses. If he has recurrences, I would not rule out the possibility of coronary angiography and would consider an implantable loop recorder.  Dispo: f/u 6 months.  Kate Sable, M.D., F.A.C.C.

## 2016-07-05 ENCOUNTER — Encounter: Payer: Self-pay | Admitting: *Deleted

## 2017-03-21 ENCOUNTER — Encounter (INDEPENDENT_AMBULATORY_CARE_PROVIDER_SITE_OTHER): Payer: Self-pay | Admitting: Internal Medicine

## 2017-03-27 ENCOUNTER — Ambulatory Visit (INDEPENDENT_AMBULATORY_CARE_PROVIDER_SITE_OTHER): Payer: Self-pay | Admitting: Internal Medicine

## 2017-03-27 ENCOUNTER — Other Ambulatory Visit (INDEPENDENT_AMBULATORY_CARE_PROVIDER_SITE_OTHER): Payer: Self-pay | Admitting: Internal Medicine

## 2017-03-27 ENCOUNTER — Encounter (INDEPENDENT_AMBULATORY_CARE_PROVIDER_SITE_OTHER): Payer: Self-pay | Admitting: Internal Medicine

## 2017-03-27 ENCOUNTER — Encounter (INDEPENDENT_AMBULATORY_CARE_PROVIDER_SITE_OTHER): Payer: Self-pay | Admitting: *Deleted

## 2017-03-27 ENCOUNTER — Telehealth (INDEPENDENT_AMBULATORY_CARE_PROVIDER_SITE_OTHER): Payer: Self-pay | Admitting: *Deleted

## 2017-03-27 VITALS — BP 100/60 | HR 64 | Temp 97.6°F | Ht 69.0 in | Wt 175.1 lb

## 2017-03-27 DIAGNOSIS — K625 Hemorrhage of anus and rectum: Secondary | ICD-10-CM | POA: Insufficient documentation

## 2017-03-27 DIAGNOSIS — R195 Other fecal abnormalities: Secondary | ICD-10-CM

## 2017-03-27 MED ORDER — PEG 3350-KCL-NA BICARB-NACL 420 G PO SOLR: 4000.0000 mL | Freq: Once | ORAL | 0 refills | Status: AC

## 2017-03-27 NOTE — Telephone Encounter (Signed)
Patient needs trilyte 

## 2017-03-27 NOTE — Patient Instructions (Signed)
Colonoscopy. The risks of bleeding, perforation and infection were reviewed with patient.  

## 2017-03-27 NOTE — Progress Notes (Addendum)
Subjective:    Patient ID: Roy [name], male    DOB: 02-18-1956, 61 y.o.   MRN: [id number]  HPI  Referred by Dr. Manuella Ghazi for positive stool card.  Appetite is good. No weight loss. Has a BM daily.  No melena or BRRB. Hx of colonic adenomas.  Cardiac stents and maintained of Plavix and ASA.   Last colonoscopy was in October of 2015. Hx of colonic adenomas. Multiple adenomas removed on 2 prior colonoscopies.  Findings:   Prep satisfactory. 10 mm broad-based cecal polyp snared and 2 hemoclips applied to polypectomy site. Two small polyps removed from ascending colon and submitted together; one via cold biopsy and the second one was cold snared. Normal rectal mucosa. Small hemorrhoids below the dentate line. Biopsy: Cecal polyp sessile serrated adenoma. Ascending colon polyps are tubular adenoma. Next colonoscopy in 5 yrs.    02/28/2017 Hemoglobin 12.4   Review of Systems Past Medical History:  Diagnosis Date  . Abdominal pain, right upper quadrant   . Arthritis    "all my joints" (11/28/2013)  . Blind left eye   . CAD (coronary artery disease)    a. Prior history of BMS to OM and RCA, subsequent cutting balloon angioplasty due to ISR in these distributions, also BMS placement with DES to LAD. b. Cath 01/2013: diffuse 3V CAD without clear culprit lesion, normal LVEF, mid LAD lesion noted, treat medically. c. Cath 11/28/2013 DES to mid LAD lesion with abnormal FFR, DES to distal LAD lesion, DES to prox RCA lesion with abnormal FFR  . Chronic lower back pain   . COPD (chronic obstructive pulmonary disease) ([place])   . DDD (degenerative disc disease)   . Depression   . GERD (gastroesophageal reflux disease)   . Hepatitis C C   "had the tx and I'm free of it"  . History of blood transfusion    "when I was taking tx for hepatitis"  . Hyperlipidemia   . Hypertension   . Kidney stones    "no surgeries"  . Macular degeneration of both eyes    "already blind in my left eye"  . Myocardial  infarction ([place]) ~ 2007   . Other and unspecified angina pectoris   . Sinus bradycardia     Past Surgical History:  Procedure Laterality Date  . CARDIAC CATHETERIZATION     "I've had a few without intervention"  . COLONOSCOPY    . COLONOSCOPY N/A 05/02/2013   Procedure: COLONOSCOPY;  Surgeon: Rogene Houston, MD;  [facility]: [facility];  Service: Endoscopy;  Laterality: N/A;  1030-moved to 1055 Ann to notify pt  . CORONARY ANGIOPLASTY WITH STENT PLACEMENT   2007; 2008; 11/28/2013   "2 + 2; + 3"  . INGUINAL HERNIA REPAIR Bilateral ~ 2000  . LEFT HEART CATHETERIZATION WITH CORONARY ANGIOGRAM N/A 01/30/2013   Procedure: LEFT HEART CATHETERIZATION WITH CORONARY ANGIOGRAM;  Surgeon: Jolaine Artist, MD;  Location: [facility] CATH LAB;  Service: Cardiovascular;  Laterality: N/A;  . LEFT HEART CATHETERIZATION WITH CORONARY ANGIOGRAM N/A 11/28/2013   Procedure: LEFT HEART CATHETERIZATION WITH CORONARY ANGIOGRAM;  Surgeon: Jettie Booze, MD;  Location: [facility] CATH LAB;  Service: Cardiovascular;  Laterality: N/A;  . LIVER BIOPSY  03/24/2010    Allergies  Allergen Reactions  . Codeine Itching  . Imdur [Isosorbide Nitrate]     Has not tolerated well in the past  . Penicillins Other (See Comments)    Chest pain  . Food Rash    PEACHES  Current Outpatient Prescriptions on File Prior to Visit  Medication Sig Dispense Refill  . aspirin 81 MG tablet Take 81 mg by mouth daily.      . clopidogrel (PLAVIX) 75 MG tablet TAKE (1) TABLET BY MOUTH ONCE DAILY. 30 tablet 6  . escitalopram (LEXAPRO) 10 MG tablet Take 10 mg by mouth daily.    . furosemide (LASIX) 20 MG tablet Take 20 mg by mouth daily.     [name] gabapentin (NEURONTIN) 300 MG capsule Take 300 mg by mouth 3 (three) times daily.     [name] levothyroxine (SYNTHROID, LEVOTHROID) 50 MCG tablet Take 50 mcg by mouth daily before breakfast.    . metoprolol succinate (TOPROL-XL) 25 MG 24 hr tablet Take 0.5 tablets (12.5 mg total) by mouth daily. 30 tablet 3   . nitroGLYCERIN (NITROSTAT) 0.4 MG SL tablet Place 1 tablet (0.4 mg total) under the tongue every 5 (five) minutes x 3 doses as needed. 25 tablet 3  . oxyCODONE-acetaminophen (PERCOCET) 10-325 MG tablet Take 1 tablet by mouth every 6 (six) hours as needed for pain.    . potassium chloride (K-DUR) 10 MEQ tablet Take 10 mEq by mouth daily.     No current facility-administered medications on file prior to visit.         Objective:   Physical Exam Blood pressure 100/60, pulse 64, temperature 97.6 F (36.4 C), height 5\' 9"  (1.753 m), weight 175 lb 1.6 oz (79.4 kg).  Alert and oriented. Skin warm and dry. Oral mucosa is moist.   . Sclera anicteric, conjunctivae is pink. Thyroid not enlarged. No cervical lymphadenopathy. Lungs clear. Heart regular rate and rhythm.  Abdomen is soft. Bowel sounds are positive. No hepatomegaly. No abdominal masses felt. No tenderness.  No edema to lower extremities.       Assessment & Plan:  + hemocult. Colonic neoplasm, AVM , Hemorrhoid  needs to be ruled out.   Colonoscopy.

## 2017-04-12 ENCOUNTER — Encounter (INDEPENDENT_AMBULATORY_CARE_PROVIDER_SITE_OTHER): Payer: Self-pay | Admitting: *Deleted

## 2017-04-12 ENCOUNTER — Ambulatory Visit: Payer: Self-pay | Admitting: Cardiovascular Disease

## 2017-06-06 ENCOUNTER — Encounter (INDEPENDENT_AMBULATORY_CARE_PROVIDER_SITE_OTHER): Payer: Self-pay | Admitting: *Deleted

## 2017-06-06 ENCOUNTER — Telehealth (INDEPENDENT_AMBULATORY_CARE_PROVIDER_SITE_OTHER): Payer: Self-pay | Admitting: *Deleted

## 2017-06-06 NOTE — Telephone Encounter (Signed)
Patient aware, new instructions mailed to patient

## 2017-06-06 NOTE — Telephone Encounter (Signed)
Patient scheduled for colonoscopy 07/06/17, needs to stop Plavix 5 days and ASA 2 days -- please advise if ok tos top, thanks

## 2017-06-06 NOTE — Telephone Encounter (Signed)
I haven't evaluated him in over a year. He has a history of multivessel stenting. Can hold Plavix but would prefer to continue ASA if possible.

## 2017-06-06 NOTE — Telephone Encounter (Signed)
Please advise 

## 2017-07-06 ENCOUNTER — Ambulatory Visit (HOSPITAL_COMMUNITY)
Admission: RE | Admit: 2017-07-06 | Discharge: 2017-07-06 | Disposition: A | Discharge status: Home / Self Care | Payer: Medicare Other | Source: Ambulatory Visit | Attending: Internal Medicine | Admitting: Internal Medicine

## 2017-07-06 ENCOUNTER — Encounter (HOSPITAL_COMMUNITY)
Admission: RE | Disposition: A | Discharge status: Home / Self Care | Payer: Self-pay | Source: Ambulatory Visit | Attending: Internal Medicine

## 2017-07-06 ENCOUNTER — Encounter (HOSPITAL_COMMUNITY): Payer: Self-pay | Admitting: *Deleted

## 2017-07-06 ENCOUNTER — Other Ambulatory Visit: Payer: Self-pay

## 2017-07-06 DIAGNOSIS — Z91018 Allergy to other foods: Secondary | ICD-10-CM | POA: Insufficient documentation

## 2017-07-06 DIAGNOSIS — K625 Hemorrhage of anus and rectum: Secondary | ICD-10-CM | POA: Insufficient documentation

## 2017-07-06 DIAGNOSIS — J449 Chronic obstructive pulmonary disease, unspecified: Secondary | ICD-10-CM | POA: Insufficient documentation

## 2017-07-06 DIAGNOSIS — H3530 Unspecified macular degeneration: Secondary | ICD-10-CM | POA: Insufficient documentation

## 2017-07-06 DIAGNOSIS — Z87891 Personal history of nicotine dependence: Secondary | ICD-10-CM | POA: Insufficient documentation

## 2017-07-06 DIAGNOSIS — E785 Hyperlipidemia, unspecified: Secondary | ICD-10-CM | POA: Diagnosis not present

## 2017-07-06 DIAGNOSIS — K5730 Diverticulosis of large intestine without perforation or abscess without bleeding: Secondary | ICD-10-CM | POA: Insufficient documentation

## 2017-07-06 DIAGNOSIS — Z888 Allergy status to other drugs, medicaments and biological substances status: Secondary | ICD-10-CM | POA: Insufficient documentation

## 2017-07-06 DIAGNOSIS — Z7982 Long term (current) use of aspirin: Secondary | ICD-10-CM | POA: Diagnosis not present

## 2017-07-06 DIAGNOSIS — H5462 Unqualified visual loss, left eye, normal vision right eye: Secondary | ICD-10-CM | POA: Insufficient documentation

## 2017-07-06 DIAGNOSIS — K219 Gastro-esophageal reflux disease without esophagitis: Secondary | ICD-10-CM | POA: Diagnosis not present

## 2017-07-06 DIAGNOSIS — Z86010 Personal history of colonic polyps: Secondary | ICD-10-CM

## 2017-07-06 DIAGNOSIS — Z8249 Family history of ischemic heart disease and other diseases of the circulatory system: Secondary | ICD-10-CM | POA: Diagnosis not present

## 2017-07-06 DIAGNOSIS — D125 Benign neoplasm of sigmoid colon: Secondary | ICD-10-CM

## 2017-07-06 DIAGNOSIS — Z800 Family history of malignant neoplasm of digestive organs: Secondary | ICD-10-CM | POA: Insufficient documentation

## 2017-07-06 DIAGNOSIS — Z885 Allergy status to narcotic agent status: Secondary | ICD-10-CM | POA: Diagnosis not present

## 2017-07-06 DIAGNOSIS — D123 Benign neoplasm of transverse colon: Secondary | ICD-10-CM

## 2017-07-06 DIAGNOSIS — F329 Major depressive disorder, single episode, unspecified: Secondary | ICD-10-CM | POA: Diagnosis not present

## 2017-07-06 DIAGNOSIS — Z955 Presence of coronary angioplasty implant and graft: Secondary | ICD-10-CM | POA: Insufficient documentation

## 2017-07-06 DIAGNOSIS — M1990 Unspecified osteoarthritis, unspecified site: Secondary | ICD-10-CM | POA: Diagnosis not present

## 2017-07-06 DIAGNOSIS — Z880 Allergy status to penicillin: Secondary | ICD-10-CM | POA: Insufficient documentation

## 2017-07-06 DIAGNOSIS — K6389 Other specified diseases of intestine: Secondary | ICD-10-CM

## 2017-07-06 DIAGNOSIS — I252 Old myocardial infarction: Secondary | ICD-10-CM | POA: Diagnosis not present

## 2017-07-06 DIAGNOSIS — I2510 Atherosclerotic heart disease of native coronary artery without angina pectoris: Secondary | ICD-10-CM | POA: Insufficient documentation

## 2017-07-06 DIAGNOSIS — Z7902 Long term (current) use of antithrombotics/antiplatelets: Secondary | ICD-10-CM | POA: Insufficient documentation

## 2017-07-06 DIAGNOSIS — R195 Other fecal abnormalities: Secondary | ICD-10-CM

## 2017-07-06 DIAGNOSIS — I10 Essential (primary) hypertension: Secondary | ICD-10-CM | POA: Insufficient documentation

## 2017-07-06 DIAGNOSIS — Z79899 Other long term (current) drug therapy: Secondary | ICD-10-CM | POA: Insufficient documentation

## 2017-07-06 DIAGNOSIS — Z87442 Personal history of urinary calculi: Secondary | ICD-10-CM | POA: Insufficient documentation

## 2017-07-06 DIAGNOSIS — Z8619 Personal history of other infectious and parasitic diseases: Secondary | ICD-10-CM | POA: Diagnosis not present

## 2017-07-06 HISTORY — PX: POLYPECTOMY: SHX5525

## 2017-07-06 HISTORY — PX: COLONOSCOPY: SHX5424

## 2017-07-06 SURGERY — COLONOSCOPY
Anesthesia: Moderate Sedation

## 2017-07-06 MED ORDER — SODIUM CHLORIDE 0.9 % IV SOLN
INTRAVENOUS | Status: DC
  Administered 2017-07-06: 11:00:00 via INTRAVENOUS

## 2017-07-06 MED ORDER — PROMETHAZINE HCL 25 MG/ML IJ SOLN
INTRAMUSCULAR | Status: DC | PRN
  Administered 2017-07-06: 12.5 mg via INTRAVENOUS

## 2017-07-06 MED ORDER — MEPERIDINE HCL 50 MG/ML IJ SOLN
INTRAMUSCULAR | Status: DC | PRN
Start: 2017-07-06 — End: 2017-07-06
  Administered 2017-07-06 (×4): 25 mg via INTRAVENOUS

## 2017-07-06 MED ORDER — PROMETHAZINE HCL 25 MG/ML IJ SOLN
INTRAMUSCULAR | Status: AC
  Filled 2017-07-06: qty 1

## 2017-07-06 MED ORDER — MEPERIDINE HCL 50 MG/ML IJ SOLN
INTRAMUSCULAR | Status: AC
  Filled 2017-07-06: qty 1

## 2017-07-06 MED ORDER — SODIUM CHLORIDE 0.9% FLUSH
INTRAVENOUS | Status: AC
  Filled 2017-07-06: qty 10

## 2017-07-06 MED ORDER — MIDAZOLAM HCL 5 MG/5ML IJ SOLN
INTRAMUSCULAR | Status: DC | PRN
  Administered 2017-07-06 (×5): 2 mg via INTRAVENOUS

## 2017-07-06 MED ORDER — MIDAZOLAM HCL 5 MG/5ML IJ SOLN
INTRAMUSCULAR | Status: AC
  Filled 2017-07-06: qty 10

## 2017-07-06 MED ORDER — STERILE WATER FOR IRRIGATION IR SOLN
Status: DC | PRN
  Administered 2017-07-06: 12:00:00

## 2017-07-06 NOTE — Op Note (Signed)
[facility] Patient Name: Roy [name] Procedure Date: 07/06/2017 10:58 AM MRN: [id number] Date of Birth: August 11, 1955 Attending MD: Hildred Laser , MD CSN: [id number] Age: 62 Admit Type: Outpatient Procedure:                Colonoscopy Indications:              Heme positive stool, history of colonic adenomas. Providers:                Hildred Laser, MD, Jeanann Lewandowsky. Sharon Seller, RN, Aram Candela Referring MD:             Monico Blitz, MD Medicines:                Promethazine 12.5 mg IV, Meperidine 100 mg IV,                            Midazolam 10 mg IV Complications:            No immediate complications. Estimated Blood Loss:     Estimated blood loss was minimal. Procedure:                Pre-Anesthesia Assessment:                           - Prior to the procedure, a History and Physical                            was performed, and patient medications and                            allergies were reviewed. The patient's tolerance of                            previous anesthesia was also reviewed. The risks                            and benefits of the procedure and the sedation                            options and risks were discussed with the patient.                            All questions were answered, and informed consent                            was obtained. Prior Anticoagulants: The patient                            last took aspirin 7 days and Xarelto (rivaroxaban)                            7 days prior to the procedure. ASA Grade  Assessment: III - A patient with severe systemic                            disease. After reviewing the risks and benefits,                            the patient was deemed in satisfactory condition to                            undergo the procedure.                           After obtaining informed consent, the colonoscope                            was passed under direct  vision. Throughout the                            procedure, the patient's blood pressure, pulse, and                            oxygen saturations were monitored continuously. The                            [id number] ([id number]) scope was introduced through                            the anus and advanced to the the cecum, identified                            by appendiceal orifice and ileocecal valve. The                            colonoscopy was performed without difficulty. The                            patient tolerated the procedure well. The quality                            of the bowel preparation was adequate. The                            ileocecal valve, appendiceal orifice, and rectum                            were photographed. Scope In: 11:56:03 AM Scope Out: 12:21:22 PM Scope Withdrawal Time: 0 hours 13 minutes 15 seconds  Total Procedure Duration: 0 hours 25 minutes 19 seconds  Findings:      The perianal and digital rectal examinations were normal.      A diffuse area of mild melanosis was found in the entire colon.      Two sessile and semi-pedunculated polyps were found in the proximal       sigmoid colon and proximal transverse colon. The polyps were 3 to 6 mm  in size. These polyps were removed with a cold snare. Resection and       retrieval were complete. The pathology specimen was placed into Bottle       Number 1.      A 8 mm polyp was found in the distal sigmoid colon. The polyp was       semi-pedunculated. The polyp was removed with a hot snare. Resection and       retrieval were complete. The pathology specimen was placed into Bottle       Number 2.      A few small-mouthed diverticula were found in the sigmoid colon.      The retroflexed view of the distal rectum and anal verge was normal and       showed no anal or rectal abnormalities. Impression:               - Melanosis in the colon.                           - Two 3 to 6 mm polyps in the  proximal sigmoid                            colon and in the proximal transverse colon, removed                            with a cold snare. Resected and retrieved.                           - One 8 mm polyp in the distal sigmoid colon,                            removed with a hot snare. Resected and retrieved.                           - Diverticulosis in the sigmoid colon. Moderate Sedation:      Moderate (conscious) sedation was administered by the endoscopy nurse       and supervised by the endoscopist. The following parameters were       monitored: oxygen saturation, heart rate, blood pressure, CO2       capnography and response to care. Total physician intraservice time was       38 minutes. Recommendation:           - Patient has a contact number available for                            emergencies. The signs and symptoms of potential                            delayed complications were discussed with the                            patient. Return to normal activities tomorrow.                            Written discharge instructions were provided to the  patient.                           - Resume previous diet today.                           - Continue present medications.                           - Resume Aspirin on 07/10/2017 at prior dose.                           - Resume Plavix (clopidogrel) at prior dose on                            07/10/2017.                           - Await pathology results.                           - Repeat colonoscopy in 5 years for surveillance. Procedure Code(s):        --- Professional ---                           [phone], Colonoscopy, flexible; with removal of                            tumor(s), polyp(s), or other lesion(s) by snare                            technique                           99152, Moderate sedation services provided by the                            same physician or other qualified health  care                            professional performing the diagnostic or                            therapeutic service that the sedation supports,                            requiring the presence of an independent trained                            observer to assist in the monitoring of the                            patient's level of consciousness and physiological                            status; initial 15 minutes of intraservice time,  patient age 39 years or older                           [phone], Moderate sedation services; each additional                            15 minutes intraservice time                           [phone], Moderate sedation services; each additional                            15 minutes intraservice time Diagnosis Code(s):        --- Professional ---                           K63.89, Other specified diseases of intestine                           D12.5, Benign neoplasm of sigmoid colon                           D12.3, Benign neoplasm of transverse colon (hepatic                            flexure or splenic flexure)                           R19.5, Other fecal abnormalities                           K57.30, Diverticulosis of large intestine without                            perforation or abscess without bleeding CPT copyright 2016 American Medical Association. All rights reserved. The codes documented in this report are preliminary and upon coder review may  be revised to meet current compliance requirements. Hildred Laser, MD Hildred Laser, MD 07/06/2017 12:34:12 PM This report has been signed electronically. Number of Addenda: 0

## 2017-07-06 NOTE — H&P (Signed)
Roy [name] is an 62 y.o. male.   Chief Complaint: Patient is here for colonoscopy. HPI: She is 62 year old Caucasian male who has history of multiple colonic polyps who was noted to have heme positive stool.  Last exam was in October 2014 with removal of cecal sessile serrated polyp along with 2 tubular adenomas.  He was noted to have heme positive stool by his PCP.  He denies melena or rectal bleeding.  He has good appetite and his weight stable.  He is on low-dose aspirin and Plavix which are both on hold. Family history significant for CRC in 2 paternal uncles who had colon carcinoma.  1 of them was younger than 91.  Past Medical History:  Diagnosis Date  . Abdominal pain, right upper quadrant   . Arthritis    "all my joints" (11/28/2013)  . Blind left eye   . CAD (coronary artery disease)    a. Prior history of BMS to OM and RCA, subsequent cutting balloon angioplasty due to ISR in these distributions, also BMS placement with DES to LAD. b. Cath 01/2013: diffuse 3V CAD without clear culprit lesion, normal LVEF, mid LAD lesion noted, treat medically. c. Cath 11/28/2013 DES to mid LAD lesion with abnormal FFR, DES to distal LAD lesion, DES to prox RCA lesion with abnormal FFR  . Chronic lower back pain   . COPD (chronic obstructive pulmonary disease) ([place])   . DDD (degenerative disc disease)   . Depression   . GERD (gastroesophageal reflux disease)   . Hepatitis C C   "had the tx and I'm free of it"  . History of blood transfusion    "when I was taking tx for hepatitis"  . Hyperlipidemia   . Hypertension   . Kidney stones    "no surgeries"  . Macular degeneration of both eyes    "already blind in my left eye"  . Myocardial infarction ([place]) ~ 2007   . Other and unspecified angina pectoris   . Sinus bradycardia     Past Surgical History:  Procedure Laterality Date  . CARDIAC CATHETERIZATION     "I've had a few without intervention"  . COLONOSCOPY    . COLONOSCOPY N/A  05/02/2013   Procedure: COLONOSCOPY;  Surgeon: Rogene Houston, MD;  Location: [facility];  Service: Endoscopy;  Laterality: N/A;  1030-moved to 1055 Ann to notify pt  . CORONARY ANGIOPLASTY WITH STENT PLACEMENT   2007; 2008; 11/28/2013   "2 + 2; + 3"  . INGUINAL HERNIA REPAIR Bilateral ~ 2000  . LEFT HEART CATHETERIZATION WITH CORONARY ANGIOGRAM N/A 01/30/2013   Procedure: LEFT HEART CATHETERIZATION WITH CORONARY ANGIOGRAM;  Surgeon: Jolaine Artist, MD;  Location: [facility] CATH LAB;  Service: Cardiovascular;  Laterality: N/A;  . LEFT HEART CATHETERIZATION WITH CORONARY ANGIOGRAM N/A 11/28/2013   Procedure: LEFT HEART CATHETERIZATION WITH CORONARY ANGIOGRAM;  Surgeon: Jettie Booze, MD;  Location: [facility] CATH LAB;  Service: Cardiovascular;  Laterality: N/A;  . LIVER BIOPSY  03/24/2010    Family History  Problem Relation Age of Onset  . Diabetes Mother   . Diabetes Brother   . Heart disease Brother   . Diabetes Brother   . Healthy Brother   . Healthy Son   . Healthy Son    Social History:  reports that he quit smoking about 11 years ago. His smoking use included cigarettes. He started smoking about 45 years ago. He has a 52.50 pack-year smoking history. he has never used smokeless  tobacco. He reports that he does not drink alcohol or use drugs.  Allergies:  Allergies  Allergen Reactions  . Codeine Itching  . Imdur [Isosorbide Nitrate]     Has not tolerated well in the past  . Penicillins Other (See Comments)    Chest pain Has patient had a PCN reaction causing immediate rash, facial/tongue/throat swelling, SOB or lightheadedness with hypotension: yes Has patient had a PCN reaction causing severe rash involving mucus membranes or skin necrosis: no Has patient had a PCN reaction that required hospitalization: no Has patient had a PCN reaction occurring within the last 10 years: yes If all of the above answers are "NO", then may proceed with Cephalosporin use.   . Food Rash     PEACHES    Medications Prior to Admission  Medication Sig Dispense Refill  . carvedilol (COREG) 6.25 MG tablet Take 6.25 mg by mouth 2 (two) times daily with a meal.    . escitalopram (LEXAPRO) 10 MG tablet Take 10 mg by mouth daily.    . furosemide (LASIX) 20 MG tablet Take 20 mg by mouth daily.     [name] gabapentin (NEURONTIN) 300 MG capsule Take 300 mg by mouth 3 (three) times daily.     [name] levothyroxine (SYNTHROID, LEVOTHROID) 50 MCG tablet Take 50 mcg by mouth daily before breakfast.    . lovastatin (MEVACOR) 20 MG tablet Take 20 mg by mouth at bedtime.    . metoprolol succinate (TOPROL-XL) 25 MG 24 hr tablet Take 0.5 tablets (12.5 mg total) by mouth daily. 30 tablet 3  . oxyCODONE-acetaminophen (PERCOCET) 10-325 MG tablet Take 1 tablet by mouth every 6 (six) hours as needed for pain.    . potassium chloride (K-DUR) 10 MEQ tablet Take 10 mEq by mouth daily.    . tamsulosin (FLOMAX) 0.4 MG CAPS capsule Take 0.4 mg by mouth.    [name] aspirin 81 MG tablet Take 81 mg by mouth daily.      . clopidogrel (PLAVIX) 75 MG tablet TAKE (1) TABLET BY MOUTH ONCE DAILY. 30 tablet 6  . nitroGLYCERIN (NITROSTAT) 0.4 MG SL tablet Place 1 tablet (0.4 mg total) under the tongue every 5 (five) minutes x 3 doses as needed. 25 tablet 3    No results found for this or any previous visit (from the past 48 hour(s)). No results found.  ROS  Blood pressure (!) 155/87, pulse 65, temperature 97.7 F (36.5 C), temperature source Oral, resp. rate 13, height 5\' 9"  (1.753 m), weight 180 lb (81.6 kg), SpO2 100 %. Physical Exam  Constitutional: He appears well-developed and well-nourished.  HENT:  Mouth/Throat: Oropharynx is clear and moist.  Eyes: Conjunctivae are normal. No scleral icterus.  Neck: No thyromegaly present.  Cardiovascular: Normal rate, regular rhythm and normal heart sounds.  No murmur heard. Respiratory: Effort normal and breath sounds normal.  GI: Soft. He exhibits no distension and no mass. There is  no tenderness.  Musculoskeletal: He exhibits no edema.  Lymphadenopathy:    He has no cervical adenopathy.  Neurological: He is alert.  Skin: Skin is warm and dry.     Assessment/Plan History of colonic adenomas. Heme positive stools. Diagnostic colonoscopy.  Hildred Laser, MD 07/06/2017, 11:38 AM

## 2017-07-06 NOTE — Discharge Instructions (Signed)
Resume aspirin and clopidogrel on 07/10/2017. Resume other medications and diet as before. No driving for 24 hours. Physician will call with biopsy results. Next colonoscopy in 5 years.   Colonoscopy, Adult, Care After This sheet gives you information about how to care for yourself after your procedure. Your health care provider may also give you more specific instructions. If you have problems or questions, contact your health care provider. What can I expect after the procedure? After the procedure, it is common to have:  A small amount of blood in your stool for 24 hours after the procedure.  Some gas.  Mild abdominal cramping or bloating.  Follow these instructions at home: General instructions   For the first 24 hours after the procedure: ? Do not drive or use machinery. ? Do not sign important documents. ? Do not drink alcohol. ? Do your regular daily activities at a slower pace than normal. ? Eat soft, easy-to-digest foods. ? Rest often.  Take over-the-counter or prescription medicines only as told by your health care provider.  It is up to you to get the results of your procedure. Ask your health care provider, or the department performing the procedure, when your results will be ready. Relieving cramping and bloating  Try walking around when you have cramps or feel bloated.  Apply heat to your abdomen as told by your health care provider. Use a heat source that your health care provider recommends, such as a moist heat pack or a heating pad. ? Place a towel between your skin and the heat source. ? Leave the heat on for 20-30 minutes. ? Remove the heat if your skin turns bright red. This is especially important if you are unable to feel pain, heat, or cold. You may have a greater risk of getting burned. Eating and drinking  Drink enough fluid to keep your urine clear or pale yellow.  Resume your normal diet as instructed by your health care provider. Avoid heavy or  fried foods that are hard to digest.  Avoid drinking alcohol for as long as instructed by your health care provider. Contact a health care provider if:  You have blood in your stool 2-3 days after the procedure. Get help right away if:  You have more than a small spotting of blood in your stool.  You pass large blood clots in your stool.  Your abdomen is swollen.  You have nausea or vomiting.  You have a fever.  You have increasing abdominal pain that is not relieved with medicine. This information is not intended to replace advice given to you by your health care provider. Make sure you discuss any questions you have with your health care provider. Document Released: 02/02/2004 Document Revised: 03/14/2016 Document Reviewed: 09/01/2015 Elsevier Interactive Patient Education  2018 [employer].   Diverticulosis Diverticulosis is a condition that develops when small pouches (diverticula) form in the wall of the large intestine (colon). The colon is where water is absorbed and stool is formed. The pouches form when the inside layer of the colon pushes through weak spots in the outer layers of the colon. You may have a few pouches or many of them. What are the causes? The cause of this condition is not known. What increases the risk? The following factors may make you more likely to develop this condition:  Being older than age 62. Your risk for this condition increases with age. Diverticulosis is rare among people younger than age 10. By age 29, 57  people have it.  Eating a low-fiber diet.  Having frequent constipation.  Being overweight.  Not getting enough exercise.  Smoking.  Taking over-the-counter pain medicines, like aspirin and ibuprofen.  Having a family history of diverticulosis.  What are the signs or symptoms? In most people, there are no symptoms of this condition. If you do have symptoms, they may include:  Bloating.  Cramps in the  abdomen.  Constipation or diarrhea.  Pain in the lower left side of the abdomen.  How is this diagnosed? This condition is most often diagnosed during an exam for other colon problems. Because diverticulosis usually has no symptoms, it often cannot be diagnosed independently. This condition may be diagnosed by:  Using a flexible scope to examine the colon (colonoscopy).  Taking an X-ray of the colon after dye has been put into the colon (barium enema).  Doing a CT scan.  How is this treated? You may not need treatment for this condition if you have never developed an infection related to diverticulosis. If you have had an infection before, treatment may include:  Eating a high-fiber diet. This may include eating more fruits, vegetables, and grains.  Taking a fiber supplement.  Taking a live bacteria supplement (probiotic).  Taking medicine to relax your colon.  Taking antibiotic medicines.  Follow these instructions at home:  Drink 6-8 glasses of water or more each day to prevent constipation.  Try not to strain when you have a bowel movement.  If you have had an infection before: ? Eat more fiber as directed by your health care provider or your diet and nutrition specialist (dietitian). ? Take a fiber supplement or probiotic, if your health care provider approves.  Take over-the-counter and prescription medicines only as told by your health care provider.  If you were prescribed an antibiotic, take it as told by your health care provider. Do not stop taking the antibiotic even if you start to feel better.  Keep all follow-up visits as told by your health care provider. This is important. Contact a health care provider if:  You have pain in your abdomen.  You have bloating.  You have cramps.  You have not had a bowel movement in 3 days. Get help right away if:  Your pain gets worse.  Your bloating becomes very bad.  You have a fever or chills, and your  symptoms suddenly get worse.  You vomit.  You have bowel movements that are bloody or black.  You have bleeding from your rectum. Summary  Diverticulosis is a condition that develops when small pouches (diverticula) form in the wall of the large intestine (colon).  You may have a few pouches or many of them.  This condition is most often diagnosed during an exam for other colon problems.  If you have had an infection related to diverticulosis, treatment may include increasing the fiber in your diet, taking supplements, or taking medicines. This information is not intended to replace advice given to you by your health care provider. Make sure you discuss any questions you have with your health care provider. Document Released: 03/17/2004 Document Revised: 05/09/2016 Document Reviewed: 05/09/2016 Elsevier Interactive Patient Education  2017 [place].   Colon Polyps Polyps are tissue growths inside the body. Polyps can grow in many places, including the large intestine (colon). A polyp may be a round bump or a mushroom-shaped growth. You could have one polyp or several. Most colon polyps are noncancerous (benign). However, some colon polyps can become  cancerous over time. What are the causes? The exact cause of colon polyps is not known. What increases the risk? This condition is more likely to develop in people who:  Have a family history of colon cancer or colon polyps.  Are older than 37 or older than 45 if they are African American.  Have inflammatory bowel disease, such as ulcerative colitis or Crohn disease.  Are overweight.  Smoke cigarettes.  Do not get enough exercise.  Drink too much alcohol.  Eat a diet that is: ? High in fat and red meat. ? Low in fiber.  Had childhood cancer that was treated with abdominal radiation.  What are the signs or symptoms? Most polyps do not cause symptoms. If you have symptoms, they may include:  Blood coming from your  rectum when having a bowel movement.  Blood in your stool.The stool may look dark red or black.  A change in bowel habits, such as constipation or diarrhea.  How is this diagnosed? This condition is diagnosed with a colonoscopy. This is a procedure that uses a lighted, flexible scope to look at the inside of your colon. How is this treated? Treatment for this condition involves removing any polyps that are found. Those polyps will then be tested for cancer. If cancer is found, your health care provider will talk to you about options for colon cancer treatment. Follow these instructions at home: Diet  Eat plenty of fiber, such as fruits, vegetables, and whole grains.  Eat foods that are high in calcium and vitamin D, such as milk, cheese, yogurt, eggs, liver, fish, and broccoli.  Limit foods high in fat, red meats, and processed meats, such as hot dogs, sausage, bacon, and lunch meats.  Maintain a healthy weight, or lose weight if recommended by your health care provider. General instructions  Do not smoke cigarettes.  Do not drink alcohol excessively.  Keep all follow-up visits as told by your health care provider. This is important. This includes keeping regularly scheduled colonoscopies. Talk to your health care provider about when you need a colonoscopy.  Exercise every day or as told by your health care provider. Contact a health care provider if:  You have new or worsening bleeding during a bowel movement.  You have new or increased blood in your stool.  You have a change in bowel habits.  You unexpectedly lose weight. This information is not intended to replace advice given to you by your health care provider. Make sure you discuss any questions you have with your health care provider. Document Released: 03/16/2004 Document Revised: 11/26/2015 Document Reviewed: 05/11/2015 Elsevier Interactive Patient Education  [employer].

## 2017-07-10 ENCOUNTER — Encounter (HOSPITAL_COMMUNITY): Payer: Self-pay | Admitting: Internal Medicine

## 2017-08-22 DIAGNOSIS — Z981 Arthrodesis status: Secondary | ICD-10-CM | POA: Diagnosis not present

## 2017-08-22 DIAGNOSIS — M47816 Spondylosis without myelopathy or radiculopathy, lumbar region: Secondary | ICD-10-CM | POA: Diagnosis not present

## 2017-08-22 DIAGNOSIS — Q763 Congenital scoliosis due to congenital bony malformation: Secondary | ICD-10-CM | POA: Diagnosis not present

## 2017-08-22 DIAGNOSIS — M4126 Other idiopathic scoliosis, lumbar region: Secondary | ICD-10-CM | POA: Diagnosis not present

## 2017-08-31 DIAGNOSIS — Z9889 Other specified postprocedural states: Secondary | ICD-10-CM | POA: Diagnosis not present

## 2017-08-31 DIAGNOSIS — M545 Low back pain: Secondary | ICD-10-CM | POA: Diagnosis not present

## 2017-08-31 DIAGNOSIS — Z981 Arthrodesis status: Secondary | ICD-10-CM | POA: Diagnosis not present

## 2017-08-31 DIAGNOSIS — M47816 Spondylosis without myelopathy or radiculopathy, lumbar region: Secondary | ICD-10-CM | POA: Diagnosis not present

## 2017-09-05 ENCOUNTER — Telehealth: Payer: Self-pay | Admitting: Cardiovascular Disease

## 2017-09-05 NOTE — Telephone Encounter (Signed)
Numerous attempts to contact patient with recall letters. Unable to reach by telephone. with no success.  Roy [name] [[id number]] 04/15/2016 1:53 PM New [10]    [System] 07/04/2016 11:04 PM Notification Sent [20]   Roy [name] [[id number]] 03/16/2017 9:19 AM Notification Sent [20]   Roy [name] [[id number]] 03/16/2017 12:44 PM Scheduled/Linked [30]   Acquanetta Chain, LPN [[id number]] 77/02/2422 3:27 PM Notification Sent [20]   Roy [name] [[id number]] 09/05/2017 11:52 AM Notification Sent [20]

## 2017-09-21 DIAGNOSIS — M4126 Other idiopathic scoliosis, lumbar region: Secondary | ICD-10-CM | POA: Diagnosis not present

## 2017-09-21 DIAGNOSIS — M47816 Spondylosis without myelopathy or radiculopathy, lumbar region: Secondary | ICD-10-CM | POA: Diagnosis not present

## 2017-09-21 DIAGNOSIS — M7062 Trochanteric bursitis, left hip: Secondary | ICD-10-CM | POA: Diagnosis not present

## 2017-10-02 DIAGNOSIS — M7062 Trochanteric bursitis, left hip: Secondary | ICD-10-CM | POA: Diagnosis not present

## 2017-10-02 DIAGNOSIS — M25552 Pain in left hip: Secondary | ICD-10-CM | POA: Diagnosis not present

## 2017-10-18 DIAGNOSIS — M47817 Spondylosis without myelopathy or radiculopathy, lumbosacral region: Secondary | ICD-10-CM | POA: Diagnosis not present

## 2017-10-18 DIAGNOSIS — G894 Chronic pain syndrome: Secondary | ICD-10-CM | POA: Diagnosis not present

## 2017-10-18 DIAGNOSIS — M4126 Other idiopathic scoliosis, lumbar region: Secondary | ICD-10-CM | POA: Diagnosis not present

## 2017-10-18 DIAGNOSIS — M47816 Spondylosis without myelopathy or radiculopathy, lumbar region: Secondary | ICD-10-CM | POA: Diagnosis not present

## 2017-11-08 DIAGNOSIS — M4126 Other idiopathic scoliosis, lumbar region: Secondary | ICD-10-CM | POA: Diagnosis not present

## 2017-11-08 DIAGNOSIS — M47816 Spondylosis without myelopathy or radiculopathy, lumbar region: Secondary | ICD-10-CM | POA: Diagnosis not present

## 2018-01-09 DIAGNOSIS — G4700 Insomnia, unspecified: Secondary | ICD-10-CM | POA: Diagnosis not present

## 2018-01-09 DIAGNOSIS — Z6827 Body mass index (BMI) 27.0-27.9, adult: Secondary | ICD-10-CM | POA: Diagnosis not present

## 2018-01-09 DIAGNOSIS — R69 Illness, unspecified: Secondary | ICD-10-CM | POA: Diagnosis not present

## 2018-01-09 DIAGNOSIS — M549 Dorsalgia, unspecified: Secondary | ICD-10-CM | POA: Diagnosis not present

## 2018-01-09 DIAGNOSIS — Z299 Encounter for prophylactic measures, unspecified: Secondary | ICD-10-CM | POA: Diagnosis not present

## 2018-01-09 DIAGNOSIS — Z79899 Other long term (current) drug therapy: Secondary | ICD-10-CM | POA: Diagnosis not present

## 2018-01-09 DIAGNOSIS — I10 Essential (primary) hypertension: Secondary | ICD-10-CM | POA: Diagnosis not present

## 2018-01-11 DIAGNOSIS — R69 Illness, unspecified: Secondary | ICD-10-CM | POA: Diagnosis not present

## 2018-01-11 DIAGNOSIS — I509 Heart failure, unspecified: Secondary | ICD-10-CM | POA: Diagnosis not present

## 2018-01-11 DIAGNOSIS — Z6827 Body mass index (BMI) 27.0-27.9, adult: Secondary | ICD-10-CM | POA: Diagnosis not present

## 2018-01-11 DIAGNOSIS — J449 Chronic obstructive pulmonary disease, unspecified: Secondary | ICD-10-CM | POA: Diagnosis not present

## 2018-01-11 DIAGNOSIS — Z299 Encounter for prophylactic measures, unspecified: Secondary | ICD-10-CM | POA: Diagnosis not present

## 2018-01-11 DIAGNOSIS — I10 Essential (primary) hypertension: Secondary | ICD-10-CM | POA: Diagnosis not present

## 2018-01-17 DIAGNOSIS — M47816 Spondylosis without myelopathy or radiculopathy, lumbar region: Secondary | ICD-10-CM | POA: Diagnosis not present

## 2018-01-17 DIAGNOSIS — G8929 Other chronic pain: Secondary | ICD-10-CM | POA: Diagnosis not present

## 2018-02-26 DIAGNOSIS — E039 Hypothyroidism, unspecified: Secondary | ICD-10-CM | POA: Diagnosis not present

## 2018-02-26 DIAGNOSIS — Z79899 Other long term (current) drug therapy: Secondary | ICD-10-CM | POA: Diagnosis not present

## 2018-02-26 DIAGNOSIS — Z299 Encounter for prophylactic measures, unspecified: Secondary | ICD-10-CM | POA: Diagnosis not present

## 2018-02-26 DIAGNOSIS — Z1331 Encounter for screening for depression: Secondary | ICD-10-CM | POA: Diagnosis not present

## 2018-02-26 DIAGNOSIS — Z0000 Encounter for general adult medical examination without abnormal findings: Secondary | ICD-10-CM | POA: Diagnosis not present

## 2018-02-26 DIAGNOSIS — Z125 Encounter for screening for malignant neoplasm of prostate: Secondary | ICD-10-CM | POA: Diagnosis not present

## 2018-02-26 DIAGNOSIS — I509 Heart failure, unspecified: Secondary | ICD-10-CM | POA: Diagnosis not present

## 2018-02-26 DIAGNOSIS — Z7189 Other specified counseling: Secondary | ICD-10-CM | POA: Diagnosis not present

## 2018-02-26 DIAGNOSIS — E7800 Pure hypercholesterolemia, unspecified: Secondary | ICD-10-CM | POA: Diagnosis not present

## 2018-02-26 DIAGNOSIS — Z1211 Encounter for screening for malignant neoplasm of colon: Secondary | ICD-10-CM | POA: Diagnosis not present

## 2018-02-26 DIAGNOSIS — Z6827 Body mass index (BMI) 27.0-27.9, adult: Secondary | ICD-10-CM | POA: Diagnosis not present

## 2018-03-12 DIAGNOSIS — I2510 Atherosclerotic heart disease of native coronary artery without angina pectoris: Secondary | ICD-10-CM | POA: Diagnosis not present

## 2018-04-15 DIAGNOSIS — R69 Illness, unspecified: Secondary | ICD-10-CM | POA: Diagnosis not present

## 2018-04-23 DIAGNOSIS — Z8619 Personal history of other infectious and parasitic diseases: Secondary | ICD-10-CM | POA: Diagnosis not present

## 2018-04-23 DIAGNOSIS — N1330 Unspecified hydronephrosis: Secondary | ICD-10-CM | POA: Diagnosis not present

## 2018-04-23 DIAGNOSIS — Z87891 Personal history of nicotine dependence: Secondary | ICD-10-CM | POA: Diagnosis not present

## 2018-04-23 DIAGNOSIS — N201 Calculus of ureter: Secondary | ICD-10-CM | POA: Diagnosis not present

## 2018-04-23 DIAGNOSIS — R112 Nausea with vomiting, unspecified: Secondary | ICD-10-CM | POA: Diagnosis not present

## 2018-04-23 DIAGNOSIS — N132 Hydronephrosis with renal and ureteral calculous obstruction: Secondary | ICD-10-CM | POA: Diagnosis not present

## 2018-04-23 DIAGNOSIS — R109 Unspecified abdominal pain: Secondary | ICD-10-CM | POA: Diagnosis not present

## 2018-04-23 DIAGNOSIS — Z955 Presence of coronary angioplasty implant and graft: Secondary | ICD-10-CM | POA: Diagnosis not present

## 2018-04-23 DIAGNOSIS — Z87442 Personal history of urinary calculi: Secondary | ICD-10-CM | POA: Diagnosis not present

## 2018-04-23 DIAGNOSIS — I252 Old myocardial infarction: Secondary | ICD-10-CM | POA: Diagnosis not present

## 2018-04-23 DIAGNOSIS — I700 Atherosclerosis of aorta: Secondary | ICD-10-CM | POA: Diagnosis not present

## 2018-04-23 DIAGNOSIS — I10 Essential (primary) hypertension: Secondary | ICD-10-CM | POA: Diagnosis not present

## 2018-04-23 DIAGNOSIS — N202 Calculus of kidney with calculus of ureter: Secondary | ICD-10-CM | POA: Diagnosis not present

## 2018-04-23 DIAGNOSIS — R1031 Right lower quadrant pain: Secondary | ICD-10-CM | POA: Diagnosis not present

## 2018-04-24 DIAGNOSIS — N201 Calculus of ureter: Secondary | ICD-10-CM | POA: Diagnosis not present

## 2018-05-03 DIAGNOSIS — N200 Calculus of kidney: Secondary | ICD-10-CM | POA: Diagnosis not present

## 2018-05-07 DIAGNOSIS — J45909 Unspecified asthma, uncomplicated: Secondary | ICD-10-CM | POA: Diagnosis not present

## 2018-05-07 DIAGNOSIS — N201 Calculus of ureter: Secondary | ICD-10-CM | POA: Diagnosis not present

## 2018-05-07 DIAGNOSIS — I10 Essential (primary) hypertension: Secondary | ICD-10-CM | POA: Diagnosis not present

## 2018-05-07 DIAGNOSIS — Z955 Presence of coronary angioplasty implant and graft: Secondary | ICD-10-CM | POA: Diagnosis not present

## 2018-05-07 DIAGNOSIS — I252 Old myocardial infarction: Secondary | ICD-10-CM | POA: Diagnosis not present

## 2018-05-14 ENCOUNTER — Telehealth: Payer: Self-pay | Admitting: Cardiovascular Disease

## 2018-05-14 ENCOUNTER — Encounter: Payer: Self-pay | Admitting: *Deleted

## 2018-05-14 NOTE — Telephone Encounter (Signed)
Pt scheduled with Bernerd Pho, [place] 11/14

## 2018-05-14 NOTE — Telephone Encounter (Signed)
Left arm has been bothering him last several weeks and has heard that if your left arm hurts could be a sign of heart trouble

## 2018-05-14 NOTE — Telephone Encounter (Signed)
Pt c/o L arm aching for the last few weeks and today his fingers started to tingle and go numb and was concerned may be heart related - pt denies SOB/dizziness/swelling/CP  - doesn't know what HR/BP has been running - has not been seen 2 year and has December appt with Dr Bronson Ing

## 2018-05-14 NOTE — Telephone Encounter (Signed)
He has a history of multivessel coronary artery stenting.  I would have him scheduled to see [place] in [place] at her next available appointment.  He needs an ECG and clinical evaluation.

## 2018-05-17 ENCOUNTER — Encounter: Payer: Self-pay | Admitting: Student

## 2018-05-17 ENCOUNTER — Ambulatory Visit (INDEPENDENT_AMBULATORY_CARE_PROVIDER_SITE_OTHER): Payer: Medicare HMO | Admitting: Student

## 2018-05-17 ENCOUNTER — Encounter: Payer: Self-pay | Admitting: *Deleted

## 2018-05-17 VITALS — BP 130/80 | HR 82 | Ht 69.0 in | Wt 179.0 lb

## 2018-05-17 DIAGNOSIS — M79602 Pain in left arm: Secondary | ICD-10-CM

## 2018-05-17 DIAGNOSIS — I25118 Atherosclerotic heart disease of native coronary artery with other forms of angina pectoris: Secondary | ICD-10-CM

## 2018-05-17 DIAGNOSIS — E785 Hyperlipidemia, unspecified: Secondary | ICD-10-CM

## 2018-05-17 DIAGNOSIS — I10 Essential (primary) hypertension: Secondary | ICD-10-CM | POA: Diagnosis not present

## 2018-05-17 NOTE — Patient Instructions (Signed)
Medication Instructions:  Your physician recommends that you continue on your current medications as directed. Please refer to the Current Medication list given to you today.  If you need a refill on your cardiac medications before your next appointment, please call your pharmacy.   Lab work: NONE   If you have labs (blood work) drawn today and your tests are completely normal, you will receive your results only by: [name] MyChart Message (if you have MyChart) OR . A paper copy in the mail If you have any lab test that is abnormal or we need to change your treatment, we will call you to review the results.  Testing/Procedures: Your physician has requested that you have a lexiscan myoview. For further information please visit [email or web address]. Please follow instruction sheet, as given.    Follow-Up: At [facility], you and your health needs are our priority.  As part of our continuing mission to provide you with exceptional heart care, we have created designated Provider Care Teams.  These Care Teams include your primary Cardiologist (physician) and Advanced Practice Providers (APPs -  Physician Assistants and Nurse Practitioners) who all work together to provide you with the care you need, when you need it. You will need a follow up appointment in December. Please call our office 2 months in advance to schedule this appointment.  You may see Kate Sable, MD or one of the following Advanced Practice Providers on your designated Care Team:   Bernerd Pho, PA-C [facility]) . Ermalinda Barrios, PA-C ([place])  Any Other Special Instructions Will Be Listed Below (If Applicable). Thank you for choosing [facility]!

## 2018-05-17 NOTE — Progress Notes (Signed)
Cardiology Office Note    Date:  05/17/2018   ID:  Roy [name], Roy [name] 23-Aug-1955, MRN [id number]  PCP:  Monico Blitz, MD  Cardiologist: Kate Sable, MD    Chief Complaint  Patient presents with  . Follow-up    left arm pain    History of Present Illness:    Roy [name] is a 62 y.o. male with past medical history of CAD (s/p BMS to OM and RCA, cath in 11/2013 with DES to mid-LAD, DES to distal-LAD, and DES to RCA), HTN, HLD, and Hepatitis C who presents to the office today for overdue follow-up.   He was last examined by Dr. Bronson Ing in 04/2016 and reported having one episode of retrosternal chest discomfort which radiated into his neck and felt like indigestion. His pain only lasted for a few seconds and then resolved. Given his recent low risk NST in 2016, no further ischemic evaluation was pursued at that time. He was informed to follow-up in 6 months but has not been evaluated since.  He called the office on 05/14/2018 reporting having left arm pain for the past few weeks with associated numbness and tingling.  He denied any specific chest pain or dyspnea. Therefore, close follow-up was arranged.  In talking with the patient today, he reports developing an aching sensation along his left arm approximately 2 weeks ago. This has been intermittent since onset and typically worsens at night. He has not noticed any association with exertion. No associated paresthesias or weakness. He denies any recent chest pain, orthopnea, PND, lower extremity edema, or palpitations. He does have occasional episodes of dyspnea on exertion but denies any acute changes in this.  He does not follow BP regularly at home but it is well controlled at 130/80 during today's visit. He has remained on ASA and Plavix and denies any evidence of active bleeding.  Past Medical History:  Diagnosis Date  . Abdominal pain, right upper quadrant   . Arthritis    "all my joints" (11/28/2013)  . Blind left  eye   . CAD (coronary artery disease)    a. Prior history of BMS to OM and RCA, subsequent cutting balloon angioplasty due to ISR in these distributions, also BMS placement with DES to LAD. b. Cath 01/2013: diffuse 3V CAD without clear culprit lesion, normal LVEF, mid LAD lesion noted, treat medically. c. Cath 11/28/2013 DES to mid LAD lesion with abnormal FFR, DES to distal LAD lesion, DES to prox RCA lesion with abnormal FFR  . Chronic lower back pain   . COPD (chronic obstructive pulmonary disease) ([place])   . DDD (degenerative disc disease)   . Depression   . GERD (gastroesophageal reflux disease)   . Hepatitis C C   "had the tx and I'm free of it"  . History of blood transfusion    "when I was taking tx for hepatitis"  . Hyperlipidemia   . Hypertension   . Kidney stones    "no surgeries"  . Macular degeneration of both eyes    "already blind in my left eye"  . Myocardial infarction ([place]) ~ 2007   . Other and unspecified angina pectoris   . Sinus bradycardia     Past Surgical History:  Procedure Laterality Date  . CARDIAC CATHETERIZATION     "I've had a few without intervention"  . COLONOSCOPY    . COLONOSCOPY N/A 05/02/2013   Procedure: COLONOSCOPY;  Surgeon: Rogene Houston, MD;  Location[facility];  Service: Endoscopy;  Laterality: N/A;  1030-moved to 1055 Ann to notify pt  . COLONOSCOPY N/A 07/06/2017   Procedure: COLONOSCOPY;  Surgeon: Rogene Houston, MD;  Location: [facility];  Service: Endoscopy;  Laterality: N/A;  12:45  . CORONARY ANGIOPLASTY WITH STENT PLACEMENT   2007; 2008; 11/28/2013   "2 + 2; + 3"  . INGUINAL HERNIA REPAIR Bilateral ~ 2000  . LEFT HEART CATHETERIZATION WITH CORONARY ANGIOGRAM N/A 01/30/2013   Procedure: LEFT HEART CATHETERIZATION WITH CORONARY ANGIOGRAM;  Surgeon: Jolaine Artist, MD;  Location[facility] C[facility] LAB;  Service: Cardiovascular;  Laterality: N/A;  . LEFT HEART CATHETERIZATION WITH CORONARY ANGIOGRAM N/A 11/28/2013   Procedure: LEFT  HEART CATHETERIZATION WITH CORONARY ANGIOGRAM;  Surgeon: Jettie Booze, MD;  Location: [facility] CATH LAB;  Service: Cardiovascular;  Laterality: N/A;  . LIVER BIOPSY  03/24/2010  . POLYPECTOMY  07/06/2017   Procedure: POLYPECTOMY;  Surgeon: Rogene Houston, MD;  Location: [facility];  Service: Endoscopy;;  colon    Current Medications: Outpatient Medications Prior to Visit  Medication Sig Dispense Refill  . aspirin 81 MG tablet Take 1 tablet (81 mg total) by mouth daily. 30 tablet   . carvedilol (COREG) 6.25 MG tablet Take 6.25 mg by mouth 2 (two) times daily with a meal.    . clopidogrel (PLAVIX) 75 MG tablet  30 tablet 6  . escitalopram (LEXAPRO) 10 MG tablet Take 10 mg by mouth daily.    . furosemide (LASIX) 20 MG tablet Take 20 mg by mouth daily.     [name] gabapentin (NEURONTIN) 300 MG capsule Take 300 mg by mouth 3 (three) times daily.     [name] levothyroxine (SYNTHROID, LEVOTHROID) 50 MCG tablet Take 50 mcg by mouth daily before breakfast.    . lovastatin (MEVACOR) 20 MG tablet Take 20 mg by mouth at bedtime.    . nitroGLYCERIN (NITROSTAT) 0.4 MG SL tablet Place 1 tablet (0.4 mg total) under the tongue every 5 (five) minutes x 3 doses as needed. 25 tablet 3  . oxyCODONE-acetaminophen (PERCOCET) 10-325 MG tablet Take 1 tablet by mouth every 6 (six) hours as needed for pain.    . potassium chloride (K-DUR) 10 MEQ tablet Take 10 mEq by mouth daily.    . tamsulosin (FLOMAX) 0.4 MG CAPS capsule Take 0.4 mg by mouth.    . metoprolol succinate (TOPROL-XL) 25 MG 24 hr tablet Take 0.5 tablets (12.5 mg total) by mouth daily. 30 tablet 3   No facility-administered medications prior to visit.      Allergies:   Codeine; Imdur [isosorbide nitrate]; Penicillins; and Food   Social History   Socioeconomic History  . Marital status: Legally Separated    Spouse name: Not on file  . Number of children: Not on file  . Years of education: Not on file  . Highest education level: Not on file    Occupational History  . Not on file  Social Needs  . Financial resource strain: Not on file  . Food insecurity:    Worry: Not on file    Inability: Not on file  . Transportation needs:    Medical: Not on file    Non-medical: Not on file  Tobacco Use  . Smoking status: Former Smoker    Packs/day: 1.50    Years: 35.00    Pack years: 52.50    Types: Cigarettes    Start date: 07/20/1971    Last attempt to quit: 02/27/2006    Years since  quitting: 12.2  . Smokeless tobacco: Never Used  Substance and Sexual Activity  . Alcohol use: No    Alcohol/week: 0.0 standard drinks  . Drug use: No    Comment: 11/28/2013 "did qthing in my teens and 20's; none since"  . Sexual activity: Yes  Lifestyle  . Physical activity:    Days per week: Not on file    Minutes per session: Not on file  . Stress: Not on file  Relationships  . Social connections:    Talks on phone: Not on file    Gets together: Not on file    Attends religious service: Not on file    Active member of club or organization: Not on file    Attends meetings of clubs or organizations: Not on file    Relationship status: Not on file  Other Topics Concern  . Not on file  Social History Narrative  . Not on file     Family History:  The patient's family history includes Diabetes in his brother, brother, and mother; Healthy in his brother, son, and son; Heart disease in his brother.   Review of Systems:   Please see the history of present illness.     General:  No chills, fever, night sweats or weight changes.  Cardiovascular:  No chest pain, edema, orthopnea, palpitations, paroxysmal nocturnal dyspnea. Positive for dyspnea on exertion and left arm pain.  Dermatological: No rash, lesions/masses Respiratory: No cough, dyspnea Urologic: No hematuria, dysuria Abdominal:   No nausea, vomiting, diarrhea, bright red blood per rectum, melena, or hematemesis Neurologic:  No visual changes, wkns, changes in mental status. All  other systems reviewed and are otherwise negative except as noted above.   Physical Exam:    VS:  BP 130/80   Pulse 82   Ht 5\' 9"  (1.753 m)   Wt 179 lb (81.2 kg)   SpO2 98%   BMI 26.43 kg/m    General: Well developed, well nourished Caucasian male appearing in no acute distress. Head: Normocephalic, atraumatic, sclera non-icteric, no xanthomas, nares are without discharge.  Neck: No carotid bruits. JVD not elevated.  Lungs: Respirations regular and unlabored, without wheezes or rales.  Heart: Regular rate and rhythm. No S3 or S4.  No murmur, no rubs, or gallops appreciated. Abdomen: Soft, non-tender, non-distended with normoactive bowel sounds. No hepatomegaly. No rebound/guarding. No obvious abdominal masses. Msk:  Strength and tone appear normal for age. No joint deformities or effusions. Extremities: No clubbing or cyanosis. No lower extremity edema.  Distal pedal pulses are 2+ bilaterally. Neuro: Alert and oriented X 3. Moves all extremities spontaneously. No focal deficits noted. No weakness along left arm. Strength 5/5. Psych:  Responds to questions appropriately with a normal affect. Skin: No rashes or lesions noted  Wt Readings from Last 3 Encounters:  05/17/18 179 lb (81.2 kg)  07/06/17 180 lb (81.6 kg)  03/27/17 175 lb 1.6 oz (79.4 kg)     Studies/Labs Reviewed:   EKG:  EKG is ordered today.  The ekg ordered today demonstrates sinus bradycardia, heart rate 57, with no acute ST changes when compared to prior tracings.  Recent Labs: No results found for requested labs within last 8760 hours.   Lipid Panel    Component Value Date/Time   CHOL 208 (H) 04/03/2013 0826   TRIG 156 (H) 04/03/2013 0826   HDL 34 (L) 04/03/2013 0826   CHOLHDL 6.1 04/03/2013 0826   VLDL 31 04/03/2013 0826   LDLCALC 143 (H) 04/03/2013 5732  Additional studies/ records that were reviewed today include:   Cardiac Catheterization: 11/2013 IMPRESSIONS:  1. Patent left main coronary  artery. 2. Significant disease in the mid left anterior descending artery by FFR. 2 discrete areas were spot stented. The more proximal area with a 3.0 x 15 drug-eluting stent. The more distal area with a 2.75 x 15 drug-eluting stent, postdilated as above. 3. Mild atherosclerosis in the left circumflex artery and its branches. 4. Significant proximal disease in the right coronary artery by FFR.  This was stented with a 3.0 x 28 drug-eluting stent, postdilated to 3.6 mm in diameter. 5. Normal left ventricular systolic function.  LVEDP 14 mmHg.  Ejection fraction 55 %.  RECOMMENDATION:  Continue dual antiplatelet therapy for at least a year. He will followup with Dr. Bronson Ing.  Continue aggressive secondary prevention. Anticipate discharge tomorrow if there no complications.   NST: 02/2015  There was no ST segment deviation noted during stress.  No T wave inversion was noted during stress.  Defect 1: There is a small defect of mild severity present in the basal inferior, mid inferior and apical inferior location. This appears to be due to artifact.  This is a low risk study.  Nuclear stress EF: 47%.  Assessment:    1. Coronary artery disease involving native coronary artery of native heart with other form of angina pectoris ([place])   2. Left arm pain   3. Essential hypertension   4. Hyperlipidemia LDL goal <70      Plan:   In order of problems listed above:  1. CAD/ Left Arm Pain - he has a complex CAD history as he is s/p BMS to OM and RCA, cath in 11/2013 with DES to mid-LAD, DES to distal-LAD, and DES to RCA. He denies any recent chest pain or dyspnea on exertion but has developed left arm discomfort over the past two weeks and is unaware of precipitating factors. He does not recall having left arm pain as an anginal equivalent in the past. Does have baseline dyspnea on exertion but denies any recent changes in this. - EKG today shows no acute ischemic changes when compared to  prior tracings. Given his history of significant CAD and new symptoms, will plan to obtain a repeat Lexiscan Myoview for ischemic evaluation. - He has remained on DAPT with ASA and Plavix given his recurrent stenting. Continue beta-blocker and statin therapy.  2. HTN - BP is well controlled at 130/80 during today's visit. He is listed as being on both Carvedilol and Toprol-XL but is unsure of what he is currently taking. Will reach out to his Pharmacy to verify.   ADDENDUM: Nursing staff was able to get in touch with his pharmacy and confirm that he is only taking Carvedilol 6.25 mg twice daily. Will remove Toprol-XL from his medication list.  3. HLD - Followed by PCP. Goal LDL is less than 70 in the setting of known CAD. He remains on Lovastatin 20 mg daily. Will request recent labs.    Medication Adjustments/Labs and Tests Ordered: Current medicines are reviewed at length with the patient today.  Concerns regarding medicines are outlined above.  Medication changes, Labs and Tests ordered today are listed in the Patient Instructions below. Patient Instructions  Medication Instructions:  Your physician recommends that you continue on your current medications as directed. Please refer to the Current Medication list given to you today.  If you need a refill on your cardiac medications before your next appointment, please call  your pharmacy.   Lab work: NONE   If you have labs (blood work) drawn today and your tests are completely normal, you will receive your results only by: [name] MyChart Message (if you have MyChart) OR . A paper copy in the mail If you have any lab test that is abnormal or we need to change your treatment, we will call you to review the results.  Testing/Procedures: Your physician has requested that you have a lexiscan myoview. For further information please visit [email or web address]. Please follow instruction sheet, as given.    Follow-Up: At [facility], you and  your health needs are our priority.  As part of our continuing mission to provide you with exceptional heart care, we have created designated Provider Care Teams.  These Care Teams include your primary Cardiologist (physician) and Advanced Practice Providers (APPs -  Physician Assistants and Nurse Practitioners) who all work together to provide you with the care you need, when you need it. You will need a follow up appointment in December. Please call our office 2 months in advance to schedule this appointment.  You may see Kate Sable, MD or one of the following Advanced Practice Providers on your designated Care Team:   Bernerd Pho, PA-C [facility]) . Ermalinda Barrios, PA-C ([place])  Any Other Special Instructions Will Be Listed Below (If Applicable). Thank you for choosing [facility]!     Signed, Erma Heritage, PA-C  05/17/2018 5:19 PM    [address] [place], [place] [address] Phone: [phone]

## 2018-05-23 DIAGNOSIS — M4712 Other spondylosis with myelopathy, cervical region: Secondary | ICD-10-CM | POA: Diagnosis not present

## 2018-05-23 DIAGNOSIS — M5020 Other cervical disc displacement, unspecified cervical region: Secondary | ICD-10-CM | POA: Diagnosis not present

## 2018-05-24 ENCOUNTER — Ambulatory Visit (HOSPITAL_COMMUNITY)
Admission: RE | Admit: 2018-05-24 | Discharge: 2018-05-24 | Disposition: A | Discharge status: Home / Self Care | Payer: Medicare HMO | Source: Ambulatory Visit | Attending: Student | Admitting: Student

## 2018-05-24 ENCOUNTER — Encounter (HOSPITAL_COMMUNITY)
Admission: RE | Admit: 2018-05-24 | Discharge: 2018-05-24 | Disposition: A | Discharge status: Home / Self Care | Payer: Medicare HMO | Source: Ambulatory Visit | Attending: Student | Admitting: Student

## 2018-05-24 ENCOUNTER — Encounter (HOSPITAL_COMMUNITY): Payer: Self-pay

## 2018-05-24 DIAGNOSIS — M79602 Pain in left arm: Secondary | ICD-10-CM

## 2018-05-24 DIAGNOSIS — I25118 Atherosclerotic heart disease of native coronary artery with other forms of angina pectoris: Secondary | ICD-10-CM

## 2018-05-24 LAB — NM MYOCAR MULTI W/SPECT W/WALL MOTION / EF
CHL CUP NUCLEAR SRS: 0
CHL CUP NUCLEAR SSS: 2
CHL CUP RESTING HR STRESS: 52 {beats}/min
LV dias vol: 87 mL (ref 62–150)
LV sys vol: 37 mL
Peak HR: 69 {beats}/min
RATE: 0.39
SDS: 2
TID: 1.07

## 2018-05-24 MED ORDER — REGADENOSON 0.4 MG/5ML IV SOLN
INTRAVENOUS | Status: AC
  Administered 2018-05-24: 0.4 mg via INTRAVENOUS
  Filled 2018-05-24: qty 5

## 2018-05-24 MED ORDER — SODIUM CHLORIDE 0.9% FLUSH
INTRAVENOUS | Status: AC
  Administered 2018-05-24: 10 mL via INTRAVENOUS
  Filled 2018-05-24: qty 10

## 2018-05-24 MED ORDER — TECHNETIUM TC 99M TETROFOSMIN IV KIT
10.0000 | PACK | Freq: Once | INTRAVENOUS | Status: AC | PRN
  Administered 2018-05-24: 10.69 via INTRAVENOUS

## 2018-05-24 MED ORDER — TECHNETIUM TC 99M TETROFOSMIN IV KIT
30.0000 | PACK | Freq: Once | INTRAVENOUS | Status: AC | PRN
  Administered 2018-05-24: 30.1 via INTRAVENOUS

## 2018-05-25 DIAGNOSIS — M5032 Other cervical disc degeneration, mid-cervical region, unspecified level: Secondary | ICD-10-CM | POA: Diagnosis not present

## 2018-05-25 DIAGNOSIS — M4712 Other spondylosis with myelopathy, cervical region: Secondary | ICD-10-CM | POA: Diagnosis not present

## 2018-05-25 DIAGNOSIS — M542 Cervicalgia: Secondary | ICD-10-CM | POA: Diagnosis not present

## 2018-05-25 DIAGNOSIS — M5020 Other cervical disc displacement, unspecified cervical region: Secondary | ICD-10-CM | POA: Diagnosis not present

## 2018-05-25 DIAGNOSIS — M47812 Spondylosis without myelopathy or radiculopathy, cervical region: Secondary | ICD-10-CM | POA: Diagnosis not present

## 2018-05-26 DIAGNOSIS — R69 Illness, unspecified: Secondary | ICD-10-CM | POA: Diagnosis not present

## 2018-05-28 ENCOUNTER — Telehealth: Payer: Self-pay | Admitting: Cardiovascular Disease

## 2018-05-28 NOTE — Telephone Encounter (Signed)
RETURNING CALL FOR RESULTS

## 2018-05-28 NOTE — Telephone Encounter (Addendum)
I will forward to B Strader PA-C   Pt aware she will call tomorrow

## 2018-05-29 NOTE — Telephone Encounter (Signed)
    Attempted to call patient and no answer. LVM asking him to call the office to review his stress test results.   Signed, Erma Heritage, PA-C 05/29/2018, 9:38 AM Pager: [phone]

## 2018-06-05 NOTE — Telephone Encounter (Signed)
I printed test result and mailed to patient with request tocall our office

## 2018-06-05 NOTE — Telephone Encounter (Signed)
   Have attempted to contact the patient multiple times to review stress test results and recommendations for a heart catheterization. Due to multiple failed attempts to reach the patient, will ask nursing staff to mail results to the patient and he will need to call back to discuss the next steps for a cardiac catheterization or have an appointment scheduled (as 30-day window for the procedure from his last office visit would expire on 06/16/2018).  Signed, Erma Heritage, PA-C 06/05/2018, 1:14 PM Pager: [phone]

## 2018-06-06 DIAGNOSIS — N200 Calculus of kidney: Secondary | ICD-10-CM | POA: Diagnosis not present

## 2018-06-06 DIAGNOSIS — N2889 Other specified disorders of kidney and ureter: Secondary | ICD-10-CM | POA: Diagnosis not present

## 2018-07-03 ENCOUNTER — Other Ambulatory Visit: Payer: Self-pay | Admitting: Cardiovascular Disease

## 2018-07-03 ENCOUNTER — Ambulatory Visit (INDEPENDENT_AMBULATORY_CARE_PROVIDER_SITE_OTHER): Payer: Medicare HMO | Admitting: Cardiovascular Disease

## 2018-07-03 ENCOUNTER — Encounter: Payer: Self-pay | Admitting: Cardiovascular Disease

## 2018-07-03 ENCOUNTER — Encounter: Payer: Self-pay | Admitting: *Deleted

## 2018-07-03 ENCOUNTER — Telehealth: Payer: Self-pay | Admitting: Cardiovascular Disease

## 2018-07-03 VITALS — BP 102/64 | HR 48 | Ht 69.0 in | Wt 183.0 lb

## 2018-07-03 DIAGNOSIS — I10 Essential (primary) hypertension: Secondary | ICD-10-CM | POA: Diagnosis not present

## 2018-07-03 DIAGNOSIS — M79602 Pain in left arm: Secondary | ICD-10-CM | POA: Diagnosis not present

## 2018-07-03 DIAGNOSIS — E785 Hyperlipidemia, unspecified: Secondary | ICD-10-CM | POA: Diagnosis not present

## 2018-07-03 DIAGNOSIS — R9439 Abnormal result of other cardiovascular function study: Secondary | ICD-10-CM | POA: Diagnosis not present

## 2018-07-03 DIAGNOSIS — M5020 Other cervical disc displacement, unspecified cervical region: Secondary | ICD-10-CM | POA: Diagnosis not present

## 2018-07-03 DIAGNOSIS — Z01812 Encounter for preprocedural laboratory examination: Secondary | ICD-10-CM

## 2018-07-03 DIAGNOSIS — I25118 Atherosclerotic heart disease of native coronary artery with other forms of angina pectoris: Secondary | ICD-10-CM

## 2018-07-03 DIAGNOSIS — M4712 Other spondylosis with myelopathy, cervical region: Secondary | ICD-10-CM | POA: Diagnosis not present

## 2018-07-03 NOTE — Patient Instructions (Signed)
Medication Instructions:  Continue all current medications.  Labwork: BMET, CBC - orders given today   Testing/Procedures: Your physician has requested that you have a cardiac catheterization. Cardiac catheterization is used to diagnose and/or treat various heart conditions. Doctors may recommend this procedure for a number of different reasons. The most common reason is to evaluate chest pain. Chest pain can be a symptom of coronary artery disease (CAD), and cardiac catheterization can show whether plaque is narrowing or blocking your heart's arteries. This procedure is also used to evaluate the valves, as well as measure the blood flow and oxygen levels in different parts of your heart. For further information please visit www.cardiosmart.org. Please follow instruction sheet, as given.   Follow-Up: 1 month   Any Other Special Instructions Will Be Listed Below (If Applicable).   If you need a refill on your cardiac medications before your next appointment, please call your pharmacy.  

## 2018-07-03 NOTE — Telephone Encounter (Signed)
°  Precert needed for: Left heart cath - Thursday, 07/12/18 [place] - 7:30 am

## 2018-07-03 NOTE — Progress Notes (Signed)
SUBJECTIVE: The patient presents for follow-up after undergoing cardiac testing for left arm pain.  He saw B.  Strader PA-C on 05/17/2018 who ordered a stress test given his extensive coronary artery disease.  Nuclear stress test on 05/24/2018 showed moderate prior myocardial infarction with moderate peri-infarct ischemia.  It was deemed an intermediate risk study.  He is taking aspirin, Plavix, carvedilol, and lovastatin.  He saw his orthopedic physician who prescribed a steroid taper.  Left arm pain diminished but he continues to have left hand tingling and numbness.  He denies chest pain.  Chronic exertional dyspnea is stable.  He does complain of exertional fatigue with diminished energy levels which have not changed since his last office visit in November.  He said he had neck x-rays which showed cervical spine spurs and arthritis and is scheduled have an MRI.    Review of Systems: As per "subjective", otherwise negative.  Allergies  Allergen Reactions  . Codeine Itching  . Imdur [Isosorbide Nitrate]     Has not tolerated well in the past  . Penicillins Other (See Comments)    Chest pain Has patient had a PCN reaction causing immediate rash, facial/tongue/throat swelling, SOB or lightheadedness with hypotension: yes Has patient had a PCN reaction causing severe rash involving mucus membranes or skin necrosis: no Has patient had a PCN reaction that required hospitalization: no Has patient had a PCN reaction occurring within the last 10 years: yes If all of the above answers are "NO", then may proceed with Cephalosporin use.   . Food Rash    PEACHES    Current Outpatient Medications  Medication Sig Dispense Refill  . amLODipine (NORVASC) 10 MG tablet Take 10 mg by mouth daily.    [name] aspirin 81 MG tablet Take 1 tablet (81 mg total) by mouth daily. 30 tablet   . carvedilol (COREG) 6.25 MG tablet Take 6.25 mg by mouth 2 (two) times daily with a meal.    . clopidogrel  (PLAVIX) 75 MG tablet  30 tablet 6  . cyclobenzaprine (FLEXERIL) 10 MG tablet Take 10 mg by mouth 3 (three) times daily as needed for muscle spasms.    [name] escitalopram (LEXAPRO) 10 MG tablet Take 10 mg by mouth daily.    . furosemide (LASIX) 20 MG tablet Take 20 mg by mouth daily.     [name] gabapentin (NEURONTIN) 300 MG capsule Take 300 mg by mouth 3 (three) times daily.     [name] levothyroxine (SYNTHROID, LEVOTHROID) 75 MCG tablet Take 75 mcg by mouth daily before breakfast.    . lisinopril (PRINIVIL,ZESTRIL) 5 MG tablet Take 5 mg by mouth daily.    [name] lovastatin (MEVACOR) 20 MG tablet Take 20 mg by mouth at bedtime.    . nitroGLYCERIN (NITROSTAT) 0.4 MG SL tablet Place 1 tablet (0.4 mg total) under the tongue every 5 (five) minutes x 3 doses as needed. 25 tablet 3  . potassium chloride (K-DUR) 10 MEQ tablet Take 10 mEq by mouth daily.    . tamsulosin (FLOMAX) 0.4 MG CAPS capsule Take 0.4 mg by mouth.     No current facility-administered medications for this visit.     Past Medical History:  Diagnosis Date  . Abdominal pain, right upper quadrant   . Arthritis    "all my joints" (11/28/2013)  . Blind left eye   . CAD (coronary artery disease)    a. Prior history of BMS to OM and RCA, subsequent cutting balloon angioplasty due  to ISR in these distributions, also BMS placement with DES to LAD. b. Cath 01/2013: diffuse 3V CAD without clear culprit lesion, normal LVEF, mid LAD lesion noted, treat medically. c. Cath 11/28/2013 DES to mid LAD lesion with abnormal FFR, DES to distal LAD lesion, DES to prox RCA lesion with abnormal FFR  . Chronic lower back pain   . COPD (chronic obstructive pulmonary disease) ([place])   . DDD (degenerative disc disease)   . Depression   . GERD (gastroesophageal reflux disease)   . Hepatitis C C   "had the tx and I'm free of it"  . History of blood transfusion    "when I was taking tx for hepatitis"  . Hyperlipidemia   . Hypertension   . Kidney stones    "no surgeries"    . Macular degeneration of both eyes    "already blind in my left eye"  . Myocardial infarction ([place]) ~ 2007   . Other and unspecified angina pectoris   . Sinus bradycardia     Past Surgical History:  Procedure Laterality Date  . CARDIAC CATHETERIZATION     "I've had a few without intervention"  . COLONOSCOPY    . COLONOSCOPY N/A 05/02/2013   Procedure: COLONOSCOPY;  Surgeon: Rogene Houston, MD;  [facility];  Service: Endoscopy;  Laterality: N/A;  1030-moved to 1055 Ann to notify pt  . COLONOSCOPY N/A 07/06/2017   Procedure: COLONOSCOPY;  Surgeon: Rogene Houston, MD;  [facility];  Service: Endoscopy;  Laterality: N/A;  12:45  . CORONARY ANGIOPLASTY WITH STENT PLACEMENT   2007; 2008; 11/28/2013   "2 + 2; + 3"  . INGUINAL HERNIA REPAIR Bilateral ~ 2000  . LEFT HEART CATHETERIZATION WITH CORONARY ANGIOGRAM N/A 01/30/2013   Procedure: LEFT HEART CATHETERIZATION WITH CORONARY ANGIOGRAM;  Surgeon: Jolaine Artist, MD;  Location: [facility] CATH LAB;  Service: Cardiovascular;  Laterality: N/A;  . LEFT HEART CATHETERIZATION WITH CORONARY ANGIOGRAM N/A 11/28/2013   Procedure: LEFT HEART CATHETERIZATION WITH CORONARY ANGIOGRAM;  Surgeon: Jettie Booze, MD;  Location: [facility] CATH LAB;  Service: Cardiovascular;  Laterality: N/A;  . LIVER BIOPSY  03/24/2010  . POLYPECTOMY  07/06/2017   Procedure: POLYPECTOMY;  Surgeon: Rogene Houston, MD;  [facility];  Service: Endoscopy;;  colon    Social History   Socioeconomic History  . Marital status: Legally Separated    Spouse name: Not on file  . Number of children: Not on file  . Years of education: Not on file  . Highest education level: Not on file  Occupational History  . Not on file  Social Needs  . Financial resource strain: Not on file  . Food insecurity:    Worry: Not on file    Inability: Not on file  . Transportation needs:    Medical: Not on file    Non-medical: Not on file  Tobacco Use  .  Smoking status: Former Smoker    Packs/day: 1.50    Years: 35.00    Pack years: 52.50    Types: Cigarettes    Start date: 07/20/1971    Last attempt to quit: 02/27/2006    Years since quitting: 12.3  . Smokeless tobacco: Never Used  Substance and Sexual Activity  . Alcohol use: No    Alcohol/week: 0.0 standard drinks  . Drug use: No    Comment: 11/28/2013 "did qthing in my teens and 20's; none since"  . Sexual activity: Yes  Lifestyle  .  Physical activity:    Days per week: Not on file    Minutes per session: Not on file  . Stress: Not on file  Relationships  . Social connections:    Talks on phone: Not on file    Gets together: Not on file    Attends religious service: Not on file    Active member of club or organization: Not on file    Attends meetings of clubs or organizations: Not on file    Relationship status: Not on file  . Intimate partner violence:    Fear of current or ex partner: Not on file    Emotionally abused: Not on file    Physically abused: Not on file    Forced sexual activity: Not on file  Other Topics Concern  . Not on file  Social History Narrative  . Not on file     Vitals:   07/03/18 1549  BP: 102/64  Pulse: (!) 48  SpO2: 94%  Weight: 183 lb (83 kg)  Height: 5\' 9"  (1.753 m)    Wt Readings from Last 3 Encounters:  07/03/18 183 lb (83 kg)  05/17/18 179 lb (81.2 kg)  07/06/17 180 lb (81.6 kg)     PHYSICAL EXAM General: NAD HEENT: Normal. Neck: No JVD, no thyromegaly. Lungs: Clear to auscultation bilaterally with normal respiratory effort. CV: Regular rate and rhythm, normal S1/S2, no S3/S4, no murmur. No pretibial or periankle edema.  No carotid bruit.   Abdomen: Soft, nontender, no distention.  Neurologic: Alert and oriented.  Psych: Normal affect. Skin: Normal. Musculoskeletal: No gross deformities.    ECG: Reviewed above under Subjective   Labs: Lab Results  Component Value Date/Time   K 4.2 11/29/2013 04:30 AM   BUN 11  11/29/2013 04:30 AM   CREATININE 0.91 11/29/2013 04:30 AM   CREATININE 0.94 05/16/2012 10:10 AM   ALT 14 11/18/2013 09:31 AM   TSH 2.849 06/14/2011 01:10 PM   HGB 12.1 (L) 11/29/2013 04:30 AM     Lipids: Lab Results  Component Value Date/Time   LDLCALC 143 (H) 04/03/2013 08:26 AM   CHOL 208 (H) 04/03/2013 08:26 AM   TRIG 156 (H) 04/03/2013 08:26 AM   HDL 34 (L) 04/03/2013 08:26 AM       ASSESSMENT AND PLAN: 1.  Coronary artery disease/left arm pain: He does have exertional fatigue and left hand tingling and numbness although left arm pain has diminished to some degree.  Nuclear stress test reviewed above with moderate peri-infarct ischemia.  Given his history of multivessel stenting to the mid and distal LAD and RCA, I feel coronary angiography is warranted.  I will arrange for this. Risks and benefits of cardiac catheterization have been discussed with the patient.  These include bleeding, infection, kidney damage, stroke, heart attack, death.  The patient understands these risks and is willing to proceed. Continue aspirin, Plavix, carvedilol, and lovastatin.  2.  Hypertension: Blood pressures controlled.  No changes to therapy.  3.  Hyperlipidemia: Continue lovastatin 20 mg.  I will obtain a copy of lipids from PCP.    Disposition: Follow up after cath  A high level of decision making was required for increased medical complexities.    Kate Sable, M.D., F.A.C.C.

## 2018-07-03 NOTE — Addendum Note (Signed)
Addended by: Laurine Blazer on: 07/03/2018 04:44 PM   Modules accepted: Orders

## 2018-07-03 NOTE — H&P (View-Only) (Signed)
SUBJECTIVE: The patient presents for follow-up after undergoing cardiac testing for left arm pain.  He saw B.  Strader PA-C on 05/17/2018 who ordered a stress test given his extensive coronary artery disease.  Nuclear stress test on 05/24/2018 showed moderate prior myocardial infarction with moderate peri-infarct ischemia.  It was deemed an intermediate risk study.  He is taking aspirin, Plavix, carvedilol, and lovastatin.  He saw his orthopedic physician who prescribed a steroid taper.  Left arm pain diminished but he continues to have left hand tingling and numbness.  He denies chest pain.  Chronic exertional dyspnea is stable.  He does complain of exertional fatigue with diminished energy levels which have not changed since his last office visit in November.  He said he had neck x-rays which showed cervical spine spurs and arthritis and is scheduled have an MRI.    Review of Systems: As per "subjective", otherwise negative.  Allergies  Allergen Reactions  . Codeine Itching  . Imdur [Isosorbide Nitrate]     Has not tolerated well in the past  . Penicillins Other (See Comments)    Chest pain Has patient had a PCN reaction causing immediate rash, facial/tongue/throat swelling, SOB or lightheadedness with hypotension: yes Has patient had a PCN reaction causing severe rash involving mucus membranes or skin necrosis: no Has patient had a PCN reaction that required hospitalization: no Has patient had a PCN reaction occurring within the last 10 years: yes If all of the above answers are "NO", then may proceed with Cephalosporin use.   . Food Rash    PEACHES    Current Outpatient Medications  Medication Sig Dispense Refill  . amLODipine (NORVASC) 10 MG tablet Take 10 mg by mouth daily.    [name] aspirin 81 MG tablet Take 1 tablet (81 mg total) by mouth daily. 30 tablet   . carvedilol (COREG) 6.25 MG tablet Take 6.25 mg by mouth 2 (two) times daily with a meal.    . clopidogrel  (PLAVIX) 75 MG tablet  30 tablet 6  . cyclobenzaprine (FLEXERIL) 10 MG tablet Take 10 mg by mouth 3 (three) times daily as needed for muscle spasms.    [name] escitalopram (LEXAPRO) 10 MG tablet Take 10 mg by mouth daily.    . furosemide (LASIX) 20 MG tablet Take 20 mg by mouth daily.     [name] gabapentin (NEURONTIN) 300 MG capsule Take 300 mg by mouth 3 (three) times daily.     [name] levothyroxine (SYNTHROID, LEVOTHROID) 75 MCG tablet Take 75 mcg by mouth daily before breakfast.    . lisinopril (PRINIVIL,ZESTRIL) 5 MG tablet Take 5 mg by mouth daily.    [name] lovastatin (MEVACOR) 20 MG tablet Take 20 mg by mouth at bedtime.    . nitroGLYCERIN (NITROSTAT) 0.4 MG SL tablet Place 1 tablet (0.4 mg total) under the tongue every 5 (five) minutes x 3 doses as needed. 25 tablet 3  . potassium chloride (K-DUR) 10 MEQ tablet Take 10 mEq by mouth daily.    . tamsulosin (FLOMAX) 0.4 MG CAPS capsule Take 0.4 mg by mouth.     No current facility-administered medications for this visit.     Past Medical History:  Diagnosis Date  . Abdominal pain, right upper quadrant   . Arthritis    "all my joints" (11/28/2013)  . Blind left eye   . CAD (coronary artery disease)    a. Prior history of BMS to OM and RCA, subsequent cutting balloon angioplasty due  to ISR in these distributions, also BMS placement with DES to LAD. b. Cath 01/2013: diffuse 3V CAD without clear culprit lesion, normal LVEF, mid LAD lesion noted, treat medically. c. Cath 11/28/2013 DES to mid LAD lesion with abnormal FFR, DES to distal LAD lesion, DES to prox RCA lesion with abnormal FFR  . Chronic lower back pain   . COPD (chronic obstructive pulmonary disease) ([place])   . DDD (degenerative disc disease)   . Depression   . GERD (gastroesophageal reflux disease)   . Hepatitis C C   "had the tx and I'm free of it"  . History of blood transfusion    "when I was taking tx for hepatitis"  . Hyperlipidemia   . Hypertension   . Kidney stones    "no surgeries"    . Macular degeneration of both eyes    "already blind in my left eye"  . Myocardial infarction ([place]) ~ 2007   . Other and unspecified angina pectoris   . Sinus bradycardia     Past Surgical History:  Procedure Laterality Date  . CARDIAC CATHETERIZATION     "I've had a few without intervention"  . COLONOSCOPY    . COLONOSCOPY N/A 05/02/2013   Procedure: COLONOSCOPY;  Surgeon: Rogene Houston, MD;  [facility];  Service: Endoscopy;  Laterality: N/A;  1030-moved to 1055 Ann to notify pt  . COLONOSCOPY N/A 07/06/2017   Procedure: COLONOSCOPY;  Surgeon: Rogene Houston, MD;  [facility];  Service: Endoscopy;  Laterality: N/A;  12:45  . CORONARY ANGIOPLASTY WITH STENT PLACEMENT   2007; 2008; 11/28/2013   "2 + 2; + 3"  . INGUINAL HERNIA REPAIR Bilateral ~ 2000  . LEFT HEART CATHETERIZATION WITH CORONARY ANGIOGRAM N/A 01/30/2013   Procedure: LEFT HEART CATHETERIZATION WITH CORONARY ANGIOGRAM;  Surgeon: Jolaine Artist, MD;  Location: [facility] CATH LAB;  Service: Cardiovascular;  Laterality: N/A;  . LEFT HEART CATHETERIZATION WITH CORONARY ANGIOGRAM N/A 11/28/2013   Procedure: LEFT HEART CATHETERIZATION WITH CORONARY ANGIOGRAM;  Surgeon: Jettie Booze, MD;  Location: [facility] CATH LAB;  Service: Cardiovascular;  Laterality: N/A;  . LIVER BIOPSY  03/24/2010  . POLYPECTOMY  07/06/2017   Procedure: POLYPECTOMY;  Surgeon: Rogene Houston, MD;  [facility];  Service: Endoscopy;;  colon    Social History   Socioeconomic History  . Marital status: Legally Separated    Spouse name: Not on file  . Number of children: Not on file  . Years of education: Not on file  . Highest education level: Not on file  Occupational History  . Not on file  Social Needs  . Financial resource strain: Not on file  . Food insecurity:    Worry: Not on file    Inability: Not on file  . Transportation needs:    Medical: Not on file    Non-medical: Not on file  Tobacco Use  .  Smoking status: Former Smoker    Packs/day: 1.50    Years: 35.00    Pack years: 52.50    Types: Cigarettes    Start date: 07/20/1971    Last attempt to quit: 02/27/2006    Years since quitting: 12.3  . Smokeless tobacco: Never Used  Substance and Sexual Activity  . Alcohol use: No    Alcohol/week: 0.0 standard drinks  . Drug use: No    Comment: 11/28/2013 "did qthing in my teens and 20's; none since"  . Sexual activity: Yes  Lifestyle  .  Physical activity:    Days per week: Not on file    Minutes per session: Not on file  . Stress: Not on file  Relationships  . Social connections:    Talks on phone: Not on file    Gets together: Not on file    Attends religious service: Not on file    Active member of club or organization: Not on file    Attends meetings of clubs or organizations: Not on file    Relationship status: Not on file  . Intimate partner violence:    Fear of current or ex partner: Not on file    Emotionally abused: Not on file    Physically abused: Not on file    Forced sexual activity: Not on file  Other Topics Concern  . Not on file  Social History Narrative  . Not on file     Vitals:   07/03/18 1549  BP: 102/64  Pulse: (!) 48  SpO2: 94%  Weight: 183 lb (83 kg)  Height: 5\' 9"  (1.753 m)    Wt Readings from Last 3 Encounters:  07/03/18 183 lb (83 kg)  05/17/18 179 lb (81.2 kg)  07/06/17 180 lb (81.6 kg)     PHYSICAL EXAM General: NAD HEENT: Normal. Neck: No JVD, no thyromegaly. Lungs: Clear to auscultation bilaterally with normal respiratory effort. CV: Regular rate and rhythm, normal S1/S2, no S3/S4, no murmur. No pretibial or periankle edema.  No carotid bruit.   Abdomen: Soft, nontender, no distention.  Neurologic: Alert and oriented.  Psych: Normal affect. Skin: Normal. Musculoskeletal: No gross deformities.    ECG: Reviewed above under Subjective   Labs: Lab Results  Component Value Date/Time   K 4.2 11/29/2013 04:30 AM   BUN 11  11/29/2013 04:30 AM   CREATININE 0.91 11/29/2013 04:30 AM   CREATININE 0.94 05/16/2012 10:10 AM   ALT 14 11/18/2013 09:31 AM   TSH 2.849 06/14/2011 01:10 PM   HGB 12.1 (L) 11/29/2013 04:30 AM     Lipids: Lab Results  Component Value Date/Time   LDLCALC 143 (H) 04/03/2013 08:26 AM   CHOL 208 (H) 04/03/2013 08:26 AM   TRIG 156 (H) 04/03/2013 08:26 AM   HDL 34 (L) 04/03/2013 08:26 AM       ASSESSMENT AND PLAN: 1.  Coronary artery disease/left arm pain: He does have exertional fatigue and left hand tingling and numbness although left arm pain has diminished to some degree.  Nuclear stress test reviewed above with moderate peri-infarct ischemia.  Given his history of multivessel stenting to the mid and distal LAD and RCA, I feel coronary angiography is warranted.  I will arrange for this. Risks and benefits of cardiac catheterization have been discussed with the patient.  These include bleeding, infection, kidney damage, stroke, heart attack, death.  The patient understands these risks and is willing to proceed. Continue aspirin, Plavix, carvedilol, and lovastatin.  2.  Hypertension: Blood pressures controlled.  No changes to therapy.  3.  Hyperlipidemia: Continue lovastatin 20 mg.  I will obtain a copy of lipids from PCP.    Disposition: Follow up after cath  A high level of decision making was required for increased medical complexities.    Kate Sable, M.D., F.A.C.C.

## 2018-07-05 DIAGNOSIS — M542 Cervicalgia: Secondary | ICD-10-CM | POA: Diagnosis not present

## 2018-07-05 DIAGNOSIS — M4712 Other spondylosis with myelopathy, cervical region: Secondary | ICD-10-CM | POA: Diagnosis not present

## 2018-07-05 DIAGNOSIS — M47812 Spondylosis without myelopathy or radiculopathy, cervical region: Secondary | ICD-10-CM | POA: Diagnosis not present

## 2018-07-05 DIAGNOSIS — M5020 Other cervical disc displacement, unspecified cervical region: Secondary | ICD-10-CM | POA: Diagnosis not present

## 2018-07-10 ENCOUNTER — Telehealth: Payer: Self-pay | Admitting: *Deleted

## 2018-07-10 DIAGNOSIS — Z01812 Encounter for preprocedural laboratory examination: Secondary | ICD-10-CM | POA: Diagnosis not present

## 2018-07-10 DIAGNOSIS — R9439 Abnormal result of other cardiovascular function study: Secondary | ICD-10-CM | POA: Diagnosis not present

## 2018-07-10 NOTE — Telephone Encounter (Signed)
-----   Message from Herminio Commons, MD sent at 07/10/2018  2:36 PM EST ----- Stable Hgb.

## 2018-07-10 NOTE — Telephone Encounter (Signed)
Called patient with test results. No answer. Left message to call back.  

## 2018-07-11 ENCOUNTER — Telehealth: Payer: Self-pay | Admitting: *Deleted

## 2018-07-11 LAB — BASIC METABOLIC PANEL
BUN: 10 mg/dL (ref 7–25)
CO2: 28 mmol/L (ref 20–32)
CREATININE: 1.17 mg/dL (ref 0.70–1.25)
Calcium: 8.4 mg/dL — ABNORMAL LOW (ref 8.6–10.3)
Chloride: 106 mmol/L (ref 98–110)
GLUCOSE: 100 mg/dL — AB (ref 65–99)
Potassium: 3.9 mmol/L (ref 3.5–5.3)
SODIUM: 141 mmol/L (ref 135–146)

## 2018-07-11 LAB — CBC
HEMATOCRIT: 37.6 % — AB (ref 38.5–50.0)
HEMOGLOBIN: 12.8 g/dL — AB (ref 13.2–17.1)
MCH: 30.1 pg (ref 27.0–33.0)
MCHC: 34 g/dL (ref 32.0–36.0)
MCV: 88.5 fL (ref 80.0–100.0)
MPV: 10.3 fL (ref 7.5–12.5)
Platelets: 260 10*3/uL (ref 140–400)
RBC: 4.25 10*6/uL (ref 4.20–5.80)
RDW: 13.9 % (ref 11.0–15.0)
WBC: 7.7 10*3/uL (ref 3.8–10.8)

## 2018-07-11 NOTE — Telephone Encounter (Signed)
Pt contacted pre-catheterization scheduled at [facility] for: Thursday July 12, 2018 7:30 AM Verified arrival time and place: [facility] En[facility] A at: 5:30 AM  No solid food after midnight prior to cath, clear liquids until 5 AM day of procedure. Contrast allergy: no  Hold: Furosemide-AM of procedure KCl-AM of procedure.  Except hold medications AM meds can be  taken pre-cath with sip of water including: ASA 81 mg Clopidogrel 75 mg  Confirm patient has responsible person to drive home post procedure and for 24 hours after you arrive home.  No answer, voice mail.

## 2018-07-11 NOTE — Telephone Encounter (Signed)
No answer/voicemail

## 2018-07-12 ENCOUNTER — Other Ambulatory Visit: Payer: Self-pay

## 2018-07-12 ENCOUNTER — Encounter (HOSPITAL_COMMUNITY)
Admission: RE | Disposition: A | Discharge status: Home / Self Care | Payer: Self-pay | Source: Home / Self Care | Attending: Interventional Cardiology

## 2018-07-12 ENCOUNTER — Ambulatory Visit (HOSPITAL_COMMUNITY)
Admission: RE | Admit: 2018-07-12 | Discharge: 2018-07-12 | Disposition: A | Discharge status: Home / Self Care | Payer: Medicare HMO | Attending: Interventional Cardiology | Admitting: Interventional Cardiology

## 2018-07-12 ENCOUNTER — Encounter (HOSPITAL_COMMUNITY): Payer: Self-pay | Admitting: Interventional Cardiology

## 2018-07-12 DIAGNOSIS — Z880 Allergy status to penicillin: Secondary | ICD-10-CM | POA: Insufficient documentation

## 2018-07-12 DIAGNOSIS — Z79899 Other long term (current) drug therapy: Secondary | ICD-10-CM | POA: Diagnosis not present

## 2018-07-12 DIAGNOSIS — Z955 Presence of coronary angioplasty implant and graft: Secondary | ICD-10-CM | POA: Diagnosis not present

## 2018-07-12 DIAGNOSIS — Z87891 Personal history of nicotine dependence: Secondary | ICD-10-CM | POA: Insufficient documentation

## 2018-07-12 DIAGNOSIS — I2510 Atherosclerotic heart disease of native coronary artery without angina pectoris: Secondary | ICD-10-CM | POA: Diagnosis not present

## 2018-07-12 DIAGNOSIS — R9439 Abnormal result of other cardiovascular function study: Secondary | ICD-10-CM | POA: Diagnosis not present

## 2018-07-12 DIAGNOSIS — I10 Essential (primary) hypertension: Secondary | ICD-10-CM | POA: Diagnosis not present

## 2018-07-12 DIAGNOSIS — M1990 Unspecified osteoarthritis, unspecified site: Secondary | ICD-10-CM | POA: Diagnosis not present

## 2018-07-12 DIAGNOSIS — Z7982 Long term (current) use of aspirin: Secondary | ICD-10-CM | POA: Diagnosis not present

## 2018-07-12 DIAGNOSIS — Z7902 Long term (current) use of antithrombotics/antiplatelets: Secondary | ICD-10-CM | POA: Diagnosis not present

## 2018-07-12 DIAGNOSIS — J449 Chronic obstructive pulmonary disease, unspecified: Secondary | ICD-10-CM | POA: Diagnosis not present

## 2018-07-12 DIAGNOSIS — K219 Gastro-esophageal reflux disease without esophagitis: Secondary | ICD-10-CM | POA: Diagnosis not present

## 2018-07-12 DIAGNOSIS — H5462 Unqualified visual loss, left eye, normal vision right eye: Secondary | ICD-10-CM | POA: Diagnosis not present

## 2018-07-12 DIAGNOSIS — E785 Hyperlipidemia, unspecified: Secondary | ICD-10-CM | POA: Insufficient documentation

## 2018-07-12 DIAGNOSIS — Z885 Allergy status to narcotic agent status: Secondary | ICD-10-CM | POA: Diagnosis not present

## 2018-07-12 DIAGNOSIS — Z888 Allergy status to other drugs, medicaments and biological substances status: Secondary | ICD-10-CM | POA: Insufficient documentation

## 2018-07-12 DIAGNOSIS — I252 Old myocardial infarction: Secondary | ICD-10-CM | POA: Diagnosis not present

## 2018-07-12 HISTORY — PX: LEFT HEART CATH AND CORONARY ANGIOGRAPHY: CATH118249

## 2018-07-12 SURGERY — LEFT HEART CATH AND CORONARY ANGIOGRAPHY
Anesthesia: LOCAL

## 2018-07-12 MED ORDER — MIDAZOLAM HCL 2 MG/2ML IJ SOLN
INTRAMUSCULAR | Status: DC | PRN
  Administered 2018-07-12: 2 mg via INTRAVENOUS
  Administered 2018-07-12: 1 mg via INTRAVENOUS

## 2018-07-12 MED ORDER — SODIUM CHLORIDE 0.9 % IV SOLN: 250.0000 mL | INTRAVENOUS | Status: DC | PRN

## 2018-07-12 MED ORDER — SODIUM CHLORIDE 0.9% FLUSH: 3.0000 mL | INTRAVENOUS | Status: DC | PRN

## 2018-07-12 MED ORDER — SODIUM CHLORIDE 0.9% FLUSH: 3.0000 mL | Freq: Two times a day (BID) | INTRAVENOUS | Status: DC

## 2018-07-12 MED ORDER — HEPARIN (PORCINE) IN NACL 1000-0.9 UT/500ML-% IV SOLN
INTRAVENOUS | Status: AC
  Filled 2018-07-12: qty 1000

## 2018-07-12 MED ORDER — CLOPIDOGREL BISULFATE 75 MG PO TABS
75.0000 mg | ORAL_TABLET | Freq: Every day | ORAL | Status: AC
  Administered 2018-07-12: 75 mg via ORAL

## 2018-07-12 MED ORDER — MIDAZOLAM HCL 2 MG/2ML IJ SOLN
INTRAMUSCULAR | Status: AC
  Filled 2018-07-12: qty 2

## 2018-07-12 MED ORDER — CLOPIDOGREL BISULFATE 75 MG PO TABS
ORAL_TABLET | ORAL | Status: AC
  Administered 2018-07-12: 75 mg via ORAL
  Filled 2018-07-12: qty 1

## 2018-07-12 MED ORDER — FENTANYL CITRATE (PF) 100 MCG/2ML IJ SOLN
INTRAMUSCULAR | Status: DC | PRN
  Administered 2018-07-12 (×2): 25 ug via INTRAVENOUS

## 2018-07-12 MED ORDER — LIDOCAINE HCL (PF) 1 % IJ SOLN
INTRAMUSCULAR | Status: AC
  Filled 2018-07-12: qty 30

## 2018-07-12 MED ORDER — SODIUM CHLORIDE 0.9 % IV SOLN: INTRAVENOUS | Status: AC

## 2018-07-12 MED ORDER — SODIUM CHLORIDE 0.9 % WEIGHT BASED INFUSION
3.0000 mL/kg/h | INTRAVENOUS | Status: AC
  Administered 2018-07-12: 3 mL/kg/h via INTRAVENOUS

## 2018-07-12 MED ORDER — ACETAMINOPHEN 325 MG PO TABS: 650.0000 mg | ORAL_TABLET | ORAL | Status: DC | PRN

## 2018-07-12 MED ORDER — VERAPAMIL HCL 2.5 MG/ML IV SOLN
INTRAVENOUS | Status: AC
  Filled 2018-07-12: qty 2

## 2018-07-12 MED ORDER — LIDOCAINE HCL (PF) 1 % IJ SOLN
INTRAMUSCULAR | Status: DC | PRN
  Administered 2018-07-12: 2 mL

## 2018-07-12 MED ORDER — VERAPAMIL HCL 2.5 MG/ML IV SOLN
INTRAVENOUS | Status: DC | PRN
  Administered 2018-07-12: 10 mL via INTRA_ARTERIAL

## 2018-07-12 MED ORDER — IOHEXOL 350 MG/ML SOLN
INTRAVENOUS | Status: DC | PRN
  Administered 2018-07-12: 75 mL via INTRACARDIAC

## 2018-07-12 MED ORDER — SODIUM CHLORIDE 0.9 % WEIGHT BASED INFUSION: 1.0000 mL/kg/h | INTRAVENOUS | Status: DC

## 2018-07-12 MED ORDER — HEPARIN (PORCINE) IN NACL 1000-0.9 UT/500ML-% IV SOLN
INTRAVENOUS | Status: DC | PRN
  Administered 2018-07-12 (×2): 500 mL

## 2018-07-12 MED ORDER — HEPARIN SODIUM (PORCINE) 1000 UNIT/ML IJ SOLN
INTRAMUSCULAR | Status: AC
  Filled 2018-07-12: qty 1

## 2018-07-12 MED ORDER — FENTANYL CITRATE (PF) 100 MCG/2ML IJ SOLN
INTRAMUSCULAR | Status: AC
  Filled 2018-07-12: qty 2

## 2018-07-12 MED ORDER — HEPARIN SODIUM (PORCINE) 1000 UNIT/ML IJ SOLN
INTRAMUSCULAR | Status: DC | PRN
  Administered 2018-07-12: 4000 [IU] via INTRAVENOUS

## 2018-07-12 MED ORDER — ASPIRIN 81 MG PO CHEW
81.0000 mg | CHEWABLE_TABLET | ORAL | Status: AC
  Administered 2018-07-12: 81 mg via ORAL
  Filled 2018-07-12: qty 1

## 2018-07-12 MED ORDER — ONDANSETRON HCL 4 MG/2ML IJ SOLN: 4.0000 mg | Freq: Four times a day (QID) | INTRAMUSCULAR | Status: DC | PRN

## 2018-07-12 SURGICAL SUPPLY — 9 items

## 2018-07-12 NOTE — Interval H&P Note (Signed)
Cath Lab Visit (complete for each Cath Lab visit)  Clinical Evaluation Leading to the Procedure:   ACS: No.  Non-ACS:    Anginal Classification: CCS III  Anti-ischemic medical therapy: Minimal Therapy (1 class of medications)  Non-Invasive Test Results: Intermediate-risk stress test findings: cardiac mortality 1-3%/year  Prior CABG: No previous CABG      History and Physical Interval Note:  07/12/2018 8:39 AM  [name]  has presented today for surgery, with the diagnosis of abnormal nuc  The various methods of treatment have been discussed with the patient and family. After consideration of risks, benefits and other options for treatment, the patient has consented to  Procedure(s): LEFT HEART CATH AND CORONARY ANGIOGRAPHY (N/A) as a surgical intervention .  The patient's history has been reviewed, patient examined, no change in status, stable for surgery.  I have reviewed the patient's chart and labs.  Questions were answered to the patient's satisfaction.     Larae Grooms

## 2018-07-12 NOTE — Discharge Instructions (Signed)
Drink plenty of fluids over next 48 hours and keep right wrist elevated at heart level for 24 hours  Radial Site Care  This sheet gives you information about how to care for yourself after your procedure. Your health care provider may also give you more specific instructions. If you have problems or questions, contact your health care provider. What can I expect after the procedure? After the procedure, it is common to have:  Bruising and tenderness at the catheter insertion area. Follow these instructions at home: Medicines  Take over-the-counter and prescription medicines only as told by your health care provider. Insertion site care  Follow instructions from your health care provider about how to take care of your insertion site. Make sure you: ? Wash your hands with soap and water before you change your bandage (dressing). If soap and water are not available, use hand sanitizer. ? Change your dressing as told by your health care provider. ? Leave stitches (sutures), skin glue, or adhesive strips in place. These skin closures may need to stay in place for 2 weeks or longer. If adhesive strip edges start to loosen and curl up, you may trim the loose edges. Do not remove adhesive strips completely unless your health care provider tells you to do that.  Check your insertion site every day for signs of infection. Check for: ? Redness, swelling, or pain. ? Fluid or blood. ? Pus or a bad smell. ? Warmth.  Do not take baths, swim, or use a hot tub until your health care provider approves.  You may shower 24-48 hours after the procedure, or as directed by your health care provider. ? Remove the dressing and gently wash the site with plain soap and water. ? Pat the area dry with a clean towel. ? Do not rub the site. That could cause bleeding.  Do not apply powder or lotion to the site. Activity   For 24 hours after the procedure, or as directed by your health care provider: ? Do not  flex or bend the affected arm. ? Do not push or pull heavy objects with the affected arm. ? Do not drive yourself home from the hospital or clinic. You may drive 24 hours after the procedure unless your health care provider tells you not to. ? Do not operate machinery or power tools.  Do not lift anything that is heavier than 10 lb (4.5 kg), or the limit that you are told, until your health care provider says that it is safe.  Ask your health care provider when it is okay to: ? Return to work or school. ? Resume usual physical activities or sports. ? Resume sexual activity. General instructions  If the catheter site starts to bleed, raise your arm and put firm pressure on the site. If the bleeding does not stop, get help right away. This is a medical emergency.  If you went home on the same day as your procedure, a responsible adult should be with you for the first 24 hours after you arrive home.  Keep all follow-up visits as told by your health care provider. This is important. Contact a health care provider if:  You have a fever.  You have redness, swelling, or yellow drainage around your insertion site. Get help right away if:  You have unusual pain at the radial site.  The catheter insertion area swells very fast.  The insertion area is bleeding, and the bleeding does not stop when you hold steady pressure on  the area.  Your arm or hand becomes pale, cool, tingly, or numb. These symptoms may represent a serious problem that is an emergency. Do not wait to see if the symptoms will go away. Get medical help right away. Call your local emergency services (911 in the [place] Do not drive yourself to the hospital. Summary  After the procedure, it is common to have bruising and tenderness at the site.  Follow instructions from your health care provider about how to take care of your radial site wound. Check the wound every day for signs of infection.  Do not lift anything that is  heavier than 10 lb (4.5 kg), or the limit that you are told, until your health care provider says that it is safe. This information is not intended to replace advice given to you by your health care provider. Make sure you discuss any questions you have with your health care provider. Document Released: 07/23/2010 Document Revised: 07/26/2017 Document Reviewed: 07/26/2017 Elsevier Interactive Patient Education  2019 [employer].

## 2018-07-13 ENCOUNTER — Encounter: Payer: Self-pay | Admitting: *Deleted

## 2018-07-13 ENCOUNTER — Institutional Professional Consult (permissible substitution) (INDEPENDENT_AMBULATORY_CARE_PROVIDER_SITE_OTHER): Payer: Medicare HMO | Admitting: Surgery

## 2018-07-13 ENCOUNTER — Other Ambulatory Visit: Payer: Self-pay | Admitting: *Deleted

## 2018-07-13 VITALS — BP 138/88 | HR 67 | Resp 20 | Ht 69.0 in | Wt 178.0 lb

## 2018-07-13 DIAGNOSIS — I2510 Atherosclerotic heart disease of native coronary artery without angina pectoris: Secondary | ICD-10-CM

## 2018-07-13 NOTE — Progress Notes (Signed)
Cardiothoracic Surgery Consultation  PCP is Monico Blitz, MD Referring Provider is Jettie Booze, MD  Chief Complaint: left arm pain and numbness, shortness of breath and fatigue  HPI:  The patient is a 63 year old gentleman with history of hypertension, hyperlipidemia, prior myocardial infarction, status post bare-metal stenting of the obtuse marginal and right coronary artery with subsequent cutting balloon angioplasty due to in-stent restenosis and DES to the LAD.  He reportedly had cardiac catheterization in July 2014 showing diffuse three-vessel coronary disease with a mid LAD stenosis noted that was treated medically.  Repeat catheterization in May 2015 showed an abnormal FFR in the LAD and right coronary arteries treated with drug-eluting stents in the mid LAD as well as in the distal LAD and proximal right coronary artery.  He has been maintained on aspirin and Plavix.  The patient presents with a several month history of left arm pain associated with numbness and tingling in his hand.  He has also had exertional fatigue and shortness of breath over the same time period.  He was apparently seen by an orthopedic surgeon and was treated with steroids and said that his left arm pain improved.  He said that he had some x-rays of his neck that showed some arthritis and is scheduled to have an MRI.  He has had previous lumbar spine surgery on 3 occasions by Dr. Glenna Fellows.  He had a nuclear stress test on 05/24/2018 which showed no ST segment changes with stress.  There is evidence of previous inferior myocardial infarction with moderate peri-infarct ischemia.  Ejection fraction was 55 to 65%.  This was an intermediate risk study and cardiac catheterization was recommended.  This was done yesterday by Dr. Irish Lack and showed severe multivessel coronary disease.  There is about 40% mid to distal left main stenosis.  The ramus branch had 95% stenosis.  The first marginal had 95% stenosis.  There is  50% ostial LAD stenosis.  The previously placed stent in the proximal LAD was widely patent with about 75% stenosis in the mid LAD beyond the stent.  The previously placed stent in the mid to distal LAD was widely patent with about 70% stenosis beyond the stent.  The right coronary artery had  stents in the proximal to midportion with some stenosis in the distal LAD beyond the stents.  The patient is here today with his wife from whom he is separated. Past Medical History:  Diagnosis Date  . Abdominal pain, right upper quadrant   . Arthritis    "all my joints" (11/28/2013)  . Blind left eye   . CAD (coronary artery disease)    a. Prior history of BMS to OM and RCA, subsequent cutting balloon angioplasty due to ISR in these distributions, also BMS placement with DES to LAD. b. Cath 01/2013: diffuse 3V CAD without clear culprit lesion, normal LVEF, mid LAD lesion noted, treat medically. c. Cath 11/28/2013 DES to mid LAD lesion with abnormal FFR, DES to distal LAD lesion, DES to prox RCA lesion with abnormal FFR  . Chronic lower back pain   . COPD (chronic obstructive pulmonary disease) ([place])   . DDD (degenerative disc disease)   . Depression   . GERD (gastroesophageal reflux disease)   . Hepatitis C C   "had the tx and I'm free of it"  . History of blood transfusion    "when I was taking tx for hepatitis"  . Hyperlipidemia   . Hypertension   . Kidney stones    "  no surgeries"  . Macular degeneration of both eyes    "already blind in my left eye"  . Myocardial infarction ([place]) ~ 2007   . Other and unspecified angina pectoris   . Sinus bradycardia     Past Surgical History:  Procedure Laterality Date  . CARDIAC CATHETERIZATION     "I've had a few without intervention"  . COLONOSCOPY    . COLONOSCOPY N/A 05/02/2013   Procedure: COLONOSCOPY;  Surgeon: Rogene Houston, MD;  [facility];  Service: Endoscopy;  Laterality: N/A;  1030-moved to 1055 Ann to notify pt  . COLONOSCOPY  N/A 07/06/2017   Procedure: COLONOSCOPY;  Surgeon: Rogene Houston, MD;  Location: [facility];  Service: Endoscopy;  Laterality: N/A;  12:45  . CORONARY ANGIOPLASTY WITH STENT PLACEMENT   2007; 2008; 11/28/2013   "2 + 2; + 3"  . INGUINAL HERNIA REPAIR Bilateral ~ 2000  . LEFT HEART CATH AND CORONARY ANGIOGRAPHY N/A 07/12/2018   Procedure: LEFT HEART CATH AND CORONARY ANGIOGRAPHY;  Surgeon: Jettie Booze, MD;  Location: [facility];  Service: Cardiovascular;  Laterality: N/A;  . LEFT HEART CATHETERIZATION WITH CORONARY ANGIOGRAM N/A 01/30/2013   Procedure: LEFT HEART CATHETERIZATION WITH CORONARY ANGIOGRAM;  Surgeon: Jolaine Artist, MD;  Location: [facility] CATH LAB;  Service: Cardiovascular;  Laterality: N/A;  . LEFT HEART CATHETERIZATION WITH CORONARY ANGIOGRAM N/A 11/28/2013   Procedure: LEFT HEART CATHETERIZATION WITH CORONARY ANGIOGRAM;  Surgeon: Jettie Booze, MD;  Location: [facility] CATH LAB;  Service: Cardiovascular;  Laterality: N/A;  . LIVER BIOPSY  03/24/2010  . POLYPECTOMY  07/06/2017   Procedure: POLYPECTOMY;  Surgeon: Rogene Houston, MD;  Location: [facility];  Service: Endoscopy;;  colon    Family History  Problem Relation Age of Onset  . Diabetes Mother   . Diabetes Brother   . Heart disease Brother   . Diabetes Brother   . Healthy Brother   . Healthy Son   . Healthy Son     Social History Social History   Tobacco Use  . Smoking status: Former Smoker    Packs/day: 1.50    Years: 35.00    Pack years: 52.50    Types: Cigarettes    Start date: 07/20/1971    Last attempt to quit: 02/27/2006    Years since quitting: 12.3  . Smokeless tobacco: Never Used  Substance Use Topics  . Alcohol use: No    Alcohol/week: 0.0 standard drinks  . Drug use: No    Comment: 11/28/2013 "did qthing in my teens and 20's; none since"    Current Outpatient Medications  Medication Sig Dispense Refill  . amLODipine (NORVASC) 10 MG tablet Take 10 mg by mouth daily.    [name]  aspirin 81 MG tablet Take 1 tablet (81 mg total) by mouth daily. 30 tablet   . carvedilol (COREG) 6.25 MG tablet Take 6.25 mg by mouth 2 (two) times daily with a meal.    . clopidogrel (PLAVIX) 75 MG tablet Take 75 mg by mouth daily.  30 tablet 6  . cyclobenzaprine (FLEXERIL) 10 MG tablet Take 10 mg by mouth at bedtime.     [name] escitalopram (LEXAPRO) 20 MG tablet Take 20 mg by mouth daily.     . furosemide (LASIX) 20 MG tablet Take 20 mg by mouth daily.     [name] gabapentin (NEURONTIN) 300 MG capsule Take 600 mg by mouth 3 (three) times daily.     [name] levothyroxine (SYNTHROID,  LEVOTHROID) 75 MCG tablet Take 75 mcg by mouth daily before breakfast.    . lisinopril (PRINIVIL,ZESTRIL) 5 MG tablet Take 5 mg by mouth daily.    [name] lovastatin (MEVACOR) 20 MG tablet Take 20 mg by mouth at bedtime.    . nitroGLYCERIN (NITROSTAT) 0.4 MG SL tablet Place 1 tablet (0.4 mg total) under the tongue every 5 (five) minutes x 3 doses as needed. (Patient taking differently: Place 0.4 mg under the tongue every 5 (five) minutes x 3 doses as needed for chest pain. ) 25 tablet 3  . oxyCODONE-acetaminophen (PERCOCET) 10-325 MG tablet Take 1 tablet by mouth every 6 (six) hours as needed for pain.    . potassium chloride (K-DUR) 10 MEQ tablet Take 10 mEq by mouth daily.    . tamsulosin (FLOMAX) 0.4 MG CAPS capsule Take 0.4 mg by mouth daily.     . traZODone (DESYREL) 100 MG tablet Take 100 mg by mouth at bedtime.     No current facility-administered medications for this visit.     Allergies  Allergen Reactions  . Codeine Itching  . Imdur [Isosorbide Nitrate] Other (See Comments)    Has not tolerated well in the past  . Penicillins Other (See Comments)    Chest pain Has patient had a PCN reaction causing immediate rash, facial/tongue/throat swelling, SOB or lightheadedness with hypotension: yes Has patient had a PCN reaction causing severe rash involving mucus membranes or skin necrosis: no Has patient had a PCN reaction that  required hospitalization: no Has patient had a PCN reaction occurring within the last 10 years: yes If all of the above answers are "NO", then may proceed with Cephalosporin use.   . Food Rash and Other (See Comments)    PEACHES    Review of Systems  Constitutional: Positive for fatigue.  HENT: Positive for dental problem.        Loose teeth.  Eyes:       Macular degeneration with vision loss.  Respiratory: Positive for shortness of breath.   Cardiovascular: Negative.   Gastrointestinal: Negative.   Endocrine: Negative.   Genitourinary:       Kidney stones  Musculoskeletal: Positive for arthralgias and back pain.  Skin: Negative.   Allergic/Immunologic: Negative.   Neurological: Negative.   Hematological: Bruises/bleeds easily.  Psychiatric/Behavioral:       Anxiety and depression    There were no vitals taken for this visit. Physical Exam Constitutional:      Appearance: Normal appearance.  HENT:     Head: Normocephalic and atraumatic.     Mouth/Throat:     Comments: Poor dentition with multiple missing teeth Eyes:     Extraocular Movements: Extraocular movements intact.     Conjunctiva/sclera: Conjunctivae normal.     Pupils: Pupils are equal, round, and reactive to light.  Neck:     Musculoskeletal: Normal range of motion.  Cardiovascular:     Rate and Rhythm: Normal rate and regular rhythm.     Pulses: Normal pulses.     Heart sounds: Normal heart sounds. No murmur.  Pulmonary:     Effort: Pulmonary effort is normal.     Breath sounds: Normal breath sounds.  Abdominal:     General: Abdomen is flat. Bowel sounds are normal. There is no distension.     Palpations: Abdomen is soft.     Tenderness: There is no abdominal tenderness.  Musculoskeletal: Normal range of motion.        General: No swelling.  Lymphadenopathy:  Cervical: No cervical adenopathy.  Skin:    General: Skin is warm and dry.  Neurological:     General: No focal deficit present.      Mental Status: He is alert and oriented to person, place, and time.  Psychiatric:        Mood and Affect: Mood normal.      Diagnostic Tests:  Physicians   Panel Physicians Referring Physician Case Authorizing Physician  Jettie Booze, MD (Primary)    Procedures   LEFT HEART CATH AND CORONARY ANGIOGRAPHY  Conclusion     Previously placed Prox RCA to Mid RCA stent (unknown type) is widely patent.  Mid LM to Dist LM lesion is 40% stenosed.  Mid RCA lesion is 10% stenosed.  Ost Ramus to Ramus lesion is 95% stenosed.  Ost 1st Mrg to 1st Mrg lesion is 95% stenosed.  Ost LAD lesion is 50% stenosed.  Previously placed Prox LAD stent (unknown type) is widely patent.  Ost 1st Diag to 1st Diag lesion is 99% stenosed.  Ost 2nd Diag to 2nd Diag lesion is 70% stenosed.  Mid LAD-1 lesion is 75% stenosed.  Previously placed Mid LAD-2 stent (unknown type) is widely patent.  Dist RCA lesion is 25% stenosed.  Dist Cx lesion is 90% stenosed.  The left ventricular systolic function is normal.  LV end diastolic pressure is normal.  The left ventricular ejection fraction is 55-65% by visual estimate.  There is no aortic valve stenosis.   Severe multivessel CAD.  Multiple branches involved and moderate distal left main and ostial LAD disease as well.  Severe mid LAD disease.  CTO of large diagonal branch.   When compared to the 2015 cath, there has been significant progression of the mid LAD disease, branch vessel disease and mild progression of the distal left main disease.  Plan for Cardiac surgery consult for CABG.     Indications   Abnormal nuclear stress test [R94.39 (ICD-10-CM)]  Procedural Details   Technical Details The risks, benefits, and details of the procedure were explained to the patient. The patient verbalized understanding and wanted to proceed. Informed written consent was obtained.  PROCEDURE TECHNIQUE: After Xylocaine anesthesia a 31F slender  sheath was placed in the right radial artery with a single anterior needle wall stick. IV Heparin was given. Right coronary angiography was done using a Judkins R4 guide catheter. Left coronary angiography was done using a Judkins L3.5 guide catheter. Left ventriculography was done using a JR4 catheter. A TR band was used for hemostasis.  Contrast: 75 cc Estimated blood loss <50 mL.   During this procedure medications were administered to achieve and maintain moderate conscious sedation while the patient's heart rate, blood pressure, and oxygen saturation were continuously monitored and I was present face-to-face 100% of this time.  Medications  (Filter: Administrations occurring from 07/12/18 0814 to 07/12/18 0998)  Medication Rate/Dose/Volume Action  Date Time   lidocaine (PF) (XYLOCAINE) 1 % injection (mL) 2 mL Given 07/12/18 0847   Total dose as of 07/13/18 1153        2 mL        Radial Cocktail/Verapamil only (mL) 10 mL Given 07/12/18 0849   Total dose as of 07/13/18 1153        10 mL        fentaNYL (SUBLIMAZE) injection (mcg) 25 mcg Given 07/12/18 0841   Total dose as of 07/13/18 1153 25 mcg Given 0851   50 mcg  midazolam (VERSED) injection (mg) 2 mg Given 07/12/18 0841   Total dose as of 07/13/18 1153 1 mg Given 0852   3 mg        Heparin (Porcine) in NaCl 1000-0.9 UT/500ML-% SOLN (mL) 500 mL Given 07/12/18 0851   Total dose as of 07/13/18 1153 500 mL Given 0851   1,000 mL        heparin injection (Units) 4,000 Units Given 07/12/18 0851   Total dose as of 07/13/18 1153        4,000 Units        iohexol (OMNIPAQUE) 350 MG/ML injection (mL) 75 mL Given 07/12/18 0913   Total dose as of 07/13/18 1153        75 mL        Sedation Time   Sedation Time Physician-1: 31 minutes 42 seconds  Complications   Complications documented before study signed (07/12/2018 10:59 AM EST)    No complications were associated with this study.  Documented by Jettie Booze, MD -  07/12/2018 9:30 AM EST    Coronary Findings   Diagnostic  Dominance: Right  Left Main  Mid LM to Dist LM lesion 40% stenosed  Mid LM to Dist LM lesion is 40% stenosed. The lesion is eccentric.  Left Anterior Descending  Ost LAD lesion 50% stenosed  Ost LAD lesion is 50% stenosed.  Prox LAD lesion 0% stenosed  Previously placed Prox LAD stent (unknown type) is widely patent.  Mid LAD-1 lesion 75% stenosed  Mid LAD-1 lesion is 75% stenosed.  Mid LAD-2 lesion 0% stenosed  Previously placed Mid LAD-2 stent (unknown type) is widely patent.  First Diagonal Branch  Collaterals  1st Diag filled by collaterals from 3rd Diag.    Ost 1st Diag to 1st Diag lesion 99% stenosed  Ost 1st Diag to 1st Diag lesion is 99% stenosed.  Second Diagonal [employer] 2nd Diag to 2nd Diag lesion 70% stenosed  Ost 2nd Diag to 2nd Diag lesion is 70% stenosed.  Ramus Intermedius  Ost Ramus to Ramus lesion 95% stenosed  Ost Ramus to Ramus lesion is 95% stenosed.  Left Circumflex  Dist Cx lesion 90% stenosed  Dist Cx lesion is 90% stenosed.  First Obtuse Marginal Branch  Ost 1st Mrg to 1st Mrg lesion 95% stenosed  Ost 1st Mrg to 1st Mrg lesion is 95% stenosed. The lesion was previously treatedover 2 years ago.  Right Coronary Artery  Prox RCA to Mid RCA lesion 0% stenosed  Previously placed Prox RCA to Mid RCA stent (unknown type) is widely patent.  Mid RCA lesion 10% stenosed  Mid RCA lesion is 10% stenosed. The lesion was previously treatedover 2 years ago.  Dist RCA lesion 25% stenosed  Dist RCA lesion is 25% stenosed.  Intervention   No interventions have been documented.  Wall Motion   Resting       All segments of the heart are normal.          Left Heart   Left Ventricle The left ventricular size is normal. The left ventricular systolic function is normal. LV end diastolic pressure is normal. The left ventricular ejection fraction is 55-65% by visual estimate. No regional wall motion  abnormalities.  Aortic Valve There is no aortic valve stenosis.  Coronary Diagrams   Diagnostic  Dominance: Right    Intervention   Implants    No implant documentation for this case.  Syngo Images   Show images for CARDIAC CATHETERIZATION  MERGE Images   Show images for CARDIAC CATHETERIZATION   Link to Procedure Log   Procedure Log    Hemo Data    Most Recent Value  AO Systolic Pressure 060 mmHg  AO Diastolic Pressure 65 mmHg  AO Mean 88 mmHg  LV Systolic Pressure 045 mmHg  LV Diastolic Pressure 7 mmHg  LV EDP 11 mmHg  AOp Systolic Pressure 997 mmHg  AOp Diastolic Pressure 67 mmHg  AOp Mean Pressure 91 mmHg  LVp Systolic Pressure 741 mmHg  LVp Diastolic Pressure 10 mmHg  LVp EDP Pressure 17 mmHg    Impression:  This 63 year old gentleman has severe three-vessel coronary disease status post multiple stenting procedures in the past.  He has pain in his left arm with numbness and tingling as well as exertional fatigue and shortness of breath of several months duration.  His nuclear stress test does show some inferior ischemia.  He has 95% hazy stenoses in the ramus and obtuse marginal branch as well as 75% LAD stenosis in 2 locations.  I think is probably best to proceed with coronary bypass graft surgery for relief of his symptoms and to prevent further ischemia and infarction.  It is not completely clear that his left arm symptoms are due to his heart and could certainly be due to some cervical spine disease.  Since this is only been going on for several months and about the same time as his fatigue and shortness of breath I think it is more likely to be due to to coronary ischemia.  I reviewed the cardiac catheterization films with the patient and his wife and answered their questions. I discussed the operative procedure with the patient and his wife including alternatives, benefits and risks; including but not limited to bleeding, blood transfusion, infection, stroke,  myocardial infarction, graft failure, heart block requiring a permanent pacemaker, organ dysfunction, and death.  Roy [name] understands and agrees to proceed.     Plan:  We will schedule coronary artery bypass graft surgery on Tuesday, 07/24/2017.  He will discontinue his Plavix 5 days preoperatively with the last dose on 07/18/2017.  I spent 60 minutes performing this consultation and > 50% of this time was spent face to face counseling and coordinating the care of this patient's severe multivessel coronary artery disease.  Gaye Pollack, MD [facility] [phone]

## 2018-07-16 DIAGNOSIS — M4712 Other spondylosis with myelopathy, cervical region: Secondary | ICD-10-CM | POA: Diagnosis not present

## 2018-07-16 DIAGNOSIS — M5020 Other cervical disc displacement, unspecified cervical region: Secondary | ICD-10-CM | POA: Diagnosis not present

## 2018-07-19 NOTE — Pre-Procedure Instructions (Signed)
[facility]  07/19/2018      [facility] - [place], [place] [address] Phone: [phone] Fax: [phone]    Your procedure is scheduled on Tuesday January 21st.  Report to [place] at [place].  Call this number if you have problems the morning of surgery:  [phone]   Remember:  Do not eat or drink after midnight.    Take these medicines the morning of surgery with A SIP OF WATER  amLODipine (NORVASC)  carvedilol (COREG) escitalopram (LEXAPRO) gabapentin (NEURONTIN)  levothyroxine (SYNTHROID, LEVOTHROID) nitroGLYCERIN (NITROSTAT) if needed oxyCODONE-acetaminophen (PERCOCET) if needed tamsulosin (FLOMAX)  Stop clopidogrel (PLAVIX) on 07/18/2018.  7 days prior to surgery STOP taking any Aspirin (unless otherwise instructed by your surgeon), Aleve, Naproxen, Ibuprofen, Motrin, Advil, Goody's, BC's, all herbal medications, fish oil, and all vitamins.     Do not wear jewelry.  Do not wear lotions, powders, or colognes, or deodorant.  Men may shave face and neck.  Do not bring valuables to the hospital.  [facility] is not responsible for any belongings or valuables.  Contacts, dentures or bridgework may not be worn into surgery.  Leave your suitcase in the car.  After surgery it may be brought to your room.  For patients admitted to the hospital, discharge time will be determined by your treatment team.  Patients discharged the day of surgery will not be allowed to drive home.    [facility]- Preparing For Surgery  Before surgery, you can play an important role. Because skin is not sterile, your skin needs to be as free of germs as possible. You can reduce the number of germs on your skin by washing with CHG (chlorahexidine gluconate) Soap before surgery.  CHG is an antiseptic cleaner which kills germs and bonds with the skin to continue killing germs even after washing.    Oral  Hygiene is also important to reduce your risk of infection.  Remember - BRUSH YOUR TEETH THE MORNING OF SURGERY WITH YOUR REGULAR TOOTHPASTE  Please do not use if you have an allergy to CHG or antibacterial soaps. If your skin becomes reddened/irritated stop using the CHG.  Do not shave (including legs and underarms) for at least 48 hours prior to first CHG shower. It is OK to shave your face.  Please follow these instructions carefully.   1. Shower the NIGHT BEFORE SURGERY and the MORNING OF SURGERY with CHG.   2. If you chose to wash your hair, wash your hair first as usual with your normal shampoo.  3. After you shampoo, rinse your hair and body thoroughly to remove the shampoo.  4. Use CHG as you would any other liquid soap. You can apply CHG directly to the skin and wash gently with a scrungie or a clean washcloth.   5. Apply the CHG Soap to your body ONLY FROM THE NECK DOWN.  Do not use on open wounds or open sores. Avoid contact with your eyes, ears, mouth and genitals (private parts). Wash Face and genitals (private parts)  with your normal soap.  6. Wash thoroughly, paying special attention to the area where your surgery will be performed.  7. Thoroughly rinse your body with warm water from the neck down.  8. DO NOT shower/wash with your normal soap after using and rinsing off the CHG Soap.  9. Pat yourself dry with a CLEAN TOWEL.  10. Wear CLEAN PAJAMAS to bed the night before surgery, wear comfortable clothes the morning of surgery  11. Place CLEAN SHEETS on your bed the night of your first shower and DO NOT SLEEP WITH PETS.    Day of Surgery:  Do not apply any deodorants/lotions.  Please wear clean clothes to the hospital/surgery center.   Remember to brush your teeth WITH YOUR REGULAR TOOTHPASTE.    Please read over the following fact sheets that you were given.

## 2018-07-20 ENCOUNTER — Ambulatory Visit (HOSPITAL_BASED_OUTPATIENT_CLINIC_OR_DEPARTMENT_OTHER)
Admission: RE | Admit: 2018-07-20 | Discharge: 2018-07-20 | Disposition: A | Discharge status: Home / Self Care | Payer: Medicare HMO | Source: Ambulatory Visit | Attending: Surgery | Admitting: Surgery

## 2018-07-20 ENCOUNTER — Other Ambulatory Visit: Payer: Self-pay

## 2018-07-20 ENCOUNTER — Ambulatory Visit (HOSPITAL_COMMUNITY)
Admission: RE | Admit: 2018-07-20 | Discharge: 2018-07-20 | Disposition: A | Discharge status: Home / Self Care | Payer: Medicare HMO | Source: Ambulatory Visit | Attending: Surgery | Admitting: Surgery

## 2018-07-20 ENCOUNTER — Encounter (HOSPITAL_COMMUNITY)
Admission: RE | Admit: 2018-07-20 | Discharge: 2018-07-20 | Disposition: A | Discharge status: Home / Self Care | Payer: Medicare HMO | Source: Ambulatory Visit | Attending: Surgery | Admitting: Surgery

## 2018-07-20 ENCOUNTER — Encounter (HOSPITAL_COMMUNITY): Payer: Self-pay

## 2018-07-20 DIAGNOSIS — I2510 Atherosclerotic heart disease of native coronary artery without angina pectoris: Secondary | ICD-10-CM | POA: Diagnosis not present

## 2018-07-20 DIAGNOSIS — Z01818 Encounter for other preprocedural examination: Secondary | ICD-10-CM | POA: Diagnosis not present

## 2018-07-20 HISTORY — DX: Hypothyroidism, unspecified: E03.9

## 2018-07-20 HISTORY — DX: Scoliosis, unspecified: M41.9

## 2018-07-20 HISTORY — DX: Personal history of urinary calculi: Z87.442

## 2018-07-20 LAB — PULMONARY FUNCTION TEST
DL/VA % pred: 71 %
DL/VA: 3.27 ml/min/mmHg/L
DLCO unc % pred: 58 %
DLCO unc: 18.2 ml/min/mmHg
FEF 25-75 Post: 2.89 L/sec
FEF 25-75 Pre: 2.32 L/sec
FEF2575-%Change-Post: 24 %
FEF2575-%Pred-Post: 105 %
FEF2575-%Pred-Pre: 85 %
FEV1-%CHANGE-POST: 4 %
FEV1-%PRED-POST: 90 %
FEV1-%Pred-Pre: 86 %
FEV1-Post: 3.06 L
FEV1-Pre: 2.91 L
FEV1FVC-%Change-Post: 4 %
FEV1FVC-%Pred-Pre: 99 %
FEV6-%Change-Post: 1 %
FEV6-%Pred-Post: 90 %
FEV6-%Pred-Pre: 89 %
FEV6-PRE: 3.83 L
FEV6-Post: 3.88 L
FEV6FVC-%Change-Post: 0 %
FEV6FVC-%Pred-Post: 104 %
FEV6FVC-%Pred-Pre: 103 %
FVC-%Change-Post: 0 %
FVC-%PRED-POST: 86 %
FVC-%PRED-PRE: 86 %
FVC-POST: 3.91 L
FVC-Pre: 3.88 L
PRE FEV6/FVC RATIO: 99 %
Post FEV1/FVC ratio: 78 %
Post FEV6/FVC ratio: 99 %
Pre FEV1/FVC ratio: 75 %
RV % pred: 106 %
RV: 2.39 L
TLC % pred: 91 %
TLC: 6.24 L

## 2018-07-20 LAB — COMPREHENSIVE METABOLIC PANEL
ALBUMIN: 3.9 g/dL (ref 3.5–5.0)
ALT: 14 U/L (ref 0–44)
ANION GAP: 8 (ref 5–15)
AST: 11 U/L — ABNORMAL LOW (ref 15–41)
Alkaline Phosphatase: 54 U/L (ref 38–126)
BUN: 9 mg/dL (ref 8–23)
CO2: 21 mmol/L — ABNORMAL LOW (ref 22–32)
Calcium: 9.1 mg/dL (ref 8.9–10.3)
Chloride: 111 mmol/L (ref 98–111)
Creatinine, Ser: 0.99 mg/dL (ref 0.61–1.24)
GFR calc Af Amer: >60 mL/min (ref >60)
GFR calc non Af Amer: >60 mL/min (ref >60)
GLUCOSE: 108 mg/dL — AB (ref 70–99)
Potassium: 4.2 mmol/L (ref 3.5–5.1)
Sodium: 140 mmol/L (ref 135–145)
Total Bilirubin: 0.3 mg/dL (ref 0.3–1.2)
Total Protein: 7.2 g/dL (ref 6.5–8.1)

## 2018-07-20 LAB — CBC
HCT: 36.6 % — ABNORMAL LOW (ref 39.0–52.0)
Hemoglobin: 11.8 g/dL — ABNORMAL LOW (ref 13.0–17.0)
MCH: 29.6 pg (ref 26.0–34.0)
MCHC: 32.2 g/dL (ref 30.0–36.0)
MCV: 92 fL (ref 80.0–100.0)
Platelets: 251 10*3/uL (ref 150–400)
RBC: 3.98 MIL/uL — ABNORMAL LOW (ref 4.22–5.81)
RDW: 14.2 % (ref 11.5–15.5)
WBC: 8.3 10*3/uL (ref 4.0–10.5)
nRBC: 0 % (ref 0.0–0.2)

## 2018-07-20 LAB — HEMOGLOBIN A1C
Hgb A1c MFr Bld: 5.6 % (ref 4.8–5.6)
Mean Plasma Glucose: 114.02 mg/dL

## 2018-07-20 LAB — URINALYSIS, ROUTINE W REFLEX MICROSCOPIC
BILIRUBIN URINE: NEGATIVE
Glucose, UA: NEGATIVE mg/dL
Ketones, ur: NEGATIVE mg/dL
Nitrite: NEGATIVE
Protein, ur: NEGATIVE mg/dL
SPECIFIC GRAVITY, URINE: 1.014 (ref 1.005–1.030)
pH: 5 (ref 5.0–8.0)

## 2018-07-20 LAB — BLOOD GAS, ARTERIAL
Acid-base deficit: 0.4 mmol/L (ref 0.0–2.0)
Bicarbonate: 23.9 mmol/L (ref 20.0–28.0)
DRAWN BY: 421801
FIO2: 21
O2 Saturation: 98.9 %
PH ART: 7.394 (ref 7.350–7.450)
Patient temperature: 98.6
pCO2 arterial: 40 mmHg (ref 32.0–48.0)
pO2, Arterial: 137 mmHg — ABNORMAL HIGH (ref 83.0–108.0)

## 2018-07-20 LAB — PROTIME-INR
INR: 0.91
Prothrombin Time: 12.2 seconds (ref 11.4–15.2)

## 2018-07-20 LAB — ABO/RH: ABO/RH(D): O POS

## 2018-07-20 LAB — SURGICAL PCR SCREEN
MRSA, PCR: NEGATIVE
Staphylococcus aureus: NEGATIVE

## 2018-07-20 LAB — APTT: aPTT: 27 seconds (ref 24–36)

## 2018-07-20 MED ORDER — ALBUTEROL SULFATE (2.5 MG/3ML) 0.083% IN NEBU
2.5000 mg | INHALATION_SOLUTION | Freq: Once | RESPIRATORY_TRACT | Status: AC
  Administered 2018-07-20: 2.5 mg via RESPIRATORY_TRACT

## 2018-07-20 NOTE — Progress Notes (Signed)
PCP - Dr. Manuella Ghazi Cardiologist - Dr. Bronson Ing  Chest x-ray - 07/20/2018 EKG - 07/12/2018 Stress Test - 2016 Cardiac Cath - several: last one 07/12/2018  Blood Thinner Instructions: Plavix last dose 07/18/18 Aspirin Instructions: continue until DOS  Anesthesia review: Hx CAD  Patient denies shortness of breath, fever, cough and chest pain at PAT appointment   Patient verbalized understanding of instructions that were given to them at the PAT appointment. Patient was also instructed that they will need to review over the PAT instructions again at home before surgery.

## 2018-07-23 MED ORDER — TRANEXAMIC ACID (OHS) BOLUS VIA INFUSION
15.0000 mg/kg | INTRAVENOUS | Status: AC
  Administered 2018-07-24: 1240.5 mg via INTRAVENOUS
  Filled 2018-07-23: qty 1241

## 2018-07-23 MED ORDER — VANCOMYCIN HCL 10 G IV SOLR
1250.0000 mg | INTRAVENOUS | Status: AC
  Administered 2018-07-24: 1250 mg via INTRAVENOUS
  Filled 2018-07-23: qty 1250

## 2018-07-23 MED ORDER — MAGNESIUM SULFATE 50 % IJ SOLN
40.0000 meq | INTRAMUSCULAR | Status: DC
  Filled 2018-07-23: qty 9.85

## 2018-07-23 MED ORDER — EPINEPHRINE PF 1 MG/ML IJ SOLN
0.0000 ug/min | INTRAVENOUS | Status: DC
  Filled 2018-07-23: qty 4

## 2018-07-23 MED ORDER — NOREPINEPHRINE-SODIUM CHLORIDE 4-0.9 MG/250ML-% IV SOLN
0.0000 ug/min | INTRAVENOUS | Status: DC
  Filled 2018-07-23: qty 250

## 2018-07-23 MED ORDER — NITROGLYCERIN IN D5W 200-5 MCG/ML-% IV SOLN
2.0000 ug/min | INTRAVENOUS | Status: DC
  Filled 2018-07-23: qty 250

## 2018-07-23 MED ORDER — INSULIN REGULAR(HUMAN) IN NACL 100-0.9 UT/100ML-% IV SOLN
INTRAVENOUS | Status: AC
  Administered 2018-07-24: .9 [IU]/h via INTRAVENOUS
  Filled 2018-07-23: qty 100

## 2018-07-23 MED ORDER — LEVOFLOXACIN IN D5W 500 MG/100ML IV SOLN
500.0000 mg | INTRAVENOUS | Status: AC
  Administered 2018-07-24: 500 mg via INTRAVENOUS
  Filled 2018-07-23: qty 100

## 2018-07-23 MED ORDER — MILRINONE LACTATE IN DEXTROSE 20-5 MG/100ML-% IV SOLN
0.3000 ug/kg/min | INTRAVENOUS | Status: DC
  Filled 2018-07-23: qty 100

## 2018-07-23 MED ORDER — DOPAMINE-DEXTROSE 3.2-5 MG/ML-% IV SOLN
0.0000 ug/kg/min | INTRAVENOUS | Status: DC
  Filled 2018-07-23: qty 250

## 2018-07-23 MED ORDER — PHENYLEPHRINE HCL-NACL 20-0.9 MG/250ML-% IV SOLN
30.0000 ug/min | INTRAVENOUS | Status: AC
  Administered 2018-07-24: 25 ug/min via INTRAVENOUS
  Filled 2018-07-23: qty 250

## 2018-07-23 MED ORDER — SODIUM CHLORIDE 0.9 % IV SOLN
INTRAVENOUS | Status: DC
  Filled 2018-07-23: qty 30

## 2018-07-23 MED ORDER — TRANEXAMIC ACID 1000 MG/10ML IV SOLN
1.5000 mg/kg/h | INTRAVENOUS | Status: AC
  Administered 2018-07-24: 1.5 mg/kg/h via INTRAVENOUS
  Filled 2018-07-23: qty 25

## 2018-07-23 MED ORDER — PLASMA-LYTE 148 IV SOLN
INTRAVENOUS | Status: AC
  Administered 2018-07-24: 500 mL
  Filled 2018-07-23: qty 2.5

## 2018-07-23 MED ORDER — POTASSIUM CHLORIDE 2 MEQ/ML IV SOLN
80.0000 meq | INTRAVENOUS | Status: DC
  Filled 2018-07-23: qty 40

## 2018-07-23 MED ORDER — TRANEXAMIC ACID (OHS) PUMP PRIME SOLUTION
2.0000 mg/kg | INTRAVENOUS | Status: DC
  Filled 2018-07-23: qty 1.65

## 2018-07-23 MED ORDER — DEXMEDETOMIDINE HCL IN NACL 400 MCG/100ML IV SOLN
0.1000 ug/kg/h | INTRAVENOUS | Status: AC
  Administered 2018-07-24: .4 ug/kg/h via INTRAVENOUS
  Filled 2018-07-23: qty 100

## 2018-07-23 NOTE — Anesthesia Preprocedure Evaluation (Addendum)
Anesthesia Evaluation  Patient identified by MRN, date of birth, ID band Patient awake    Reviewed: Allergy & Precautions, NPO status , Patient's Chart, lab work & pertinent test results, reviewed documented beta blocker date and time   History of Anesthesia Complications Negative for: history of anesthetic complications  Airway Mallampati: I  TM Distance: >3 FB Neck ROM: Full    Dental  (+) Missing, Edentulous Upper, Dental Advisory Given   Pulmonary COPD,  COPD inhaler, former smoker,    Pulmonary exam normal breath sounds clear to auscultation       Cardiovascular hypertension, Pt. on home beta blockers and Pt. on medications + angina + CAD, + Past MI and + Cardiac Stents  Normal cardiovascular exam Rhythm:Regular Rate:Normal   '20 Carotid US - 1-39% b/l ICAS  '20 Cath - Previously placed Prox RCA to Mid RCA stent (unknown type) is widely patent. Mid LM to Dist LM lesion is 40% stenosed. Mid RCA lesion is 10% stenosed. Ost Ramus to Ramus lesion is 95% stenosed. Ost 1st Mrg to 1st Mrg lesion is 95% stenosed. Ost LAD lesion is 50% stenosed. Previously placed Prox LAD stent (unknown type) i widely patent. Ost 1st Diag to 1st Diag lesion is 99% stenosed. Ost 2nd Diag to 2nd Diag lesion is 70% stenosed. Mid LAD-1 lesion is 75% stenosed. Previously placed Mid LAD-2 stent (unknown type) is widely patent. Dist RCA lesion is 25% stenosed. Dist Cx lesion is 90% stenosed. The left ventricular systolic function is normal. LV end diastolic pressure is normal. The LVEF is 55-65% by visual estimate. There is no aortic valve stenosis.    Neuro/Psych PSYCHIATRIC DISORDERS Depression negative neurological ROS     GI/Hepatic GERD  Medicated and Controlled,(+) Hepatitis -, C  Endo/Other  Hypothyroidism   Renal/GU Renal disease (kidney stones)     Musculoskeletal  (+) Arthritis ,  Scoliosis, chronic back pain    Abdominal   Peds  Hematology  (+) anemia , HLD   Anesthesia Other Findings   Reproductive/Obstetrics                           Anesthesia Physical Anesthesia Plan  ASA: IV  Anesthesia Plan: General   Post-op Pain Management:    Induction: Intravenous  PONV Risk Score and Plan: 2 and Treatment may vary due to age or medical condition, Ondansetron, Dexamethasone and Midazolam  Airway Management Planned: Oral ETT  Additional Equipment: Arterial line, CVP, PA Cath, Ultrasound Guidance Line Placement and TEE  Intra-op Plan:   Post-operative Plan: Post-operative intubation/ventilation  Informed Consent: I have reviewed the patients History and Physical, chart, labs and discussed the procedure including the risks, benefits and alternatives for the proposed anesthesia with the patient or authorized representative who has indicated his/her understanding and acceptance.     Dental advisory given  Plan Discussed with: CRNA and Anesthesiologist  Anesthesia Plan Comments:        Anesthesia Quick Evaluation

## 2018-07-23 NOTE — H&P (Signed)
[address]       [place], [address]             [phone]      Cardiothoracic Surgery Admission History and Physical   PCP is Monico Blitz, MD Referring Provider is Jettie Booze, MD  Chief Complaint: left arm pain and numbness, shortness of breath and fatigue  HPI:  The patient is a 63 year old gentleman with history of hypertension, hyperlipidemia, prior myocardial infarction, status post bare-metal stenting of the obtuse marginal and right coronary artery with subsequent cutting balloon angioplasty due to in-stent restenosis and DES to the LAD.  He reportedly had cardiac catheterization in July 2014 showing diffuse three-vessel coronary disease with a mid LAD stenosis noted that was treated medically.  Repeat catheterization in May 2015 showed an abnormal FFR in the LAD and right coronary arteries treated with drug-eluting stents in the mid LAD as well as in the distal LAD and proximal right coronary artery.  He has been maintained on aspirin and Plavix.  The patient presents with a several month history of left arm pain associated with numbness and tingling in his hand.  He has also had exertional fatigue and shortness of breath over the same time period.  He was apparently seen by an orthopedic surgeon and was treated with steroids and said that his left arm pain improved.  He said that he had some x-rays of his neck that showed some arthritis and is scheduled to have an MRI.  He has had previous lumbar spine surgery on 3 occasions by Dr. Glenna Fellows.  He had a nuclear stress test on 05/24/2018 which showed no ST segment changes with stress.  There is evidence of previous inferior myocardial infarction with moderate peri-infarct ischemia.  Ejection fraction was 55 to 65%.  This was an intermediate risk study and cardiac catheterization was recommended.  This was done yesterday by Dr. Irish Lack and showed severe multivessel coronary disease.  There is about 40%  mid to distal left main stenosis.  The ramus branch had 95% stenosis.  The first marginal had 95% stenosis.  There is 50% ostial LAD stenosis.  The previously placed stent in the proximal LAD was widely patent with about 75% stenosis in the mid LAD beyond the stent.  The previously placed stent in the mid to distal LAD was widely patent with about 70% stenosis beyond the stent.  The right coronary artery had  stents in the proximal to midportion with some stenosis in the distal LAD beyond the stents.  The patient is separated from his wife but she came to the office visit with him and seems concerned about his well-being.     Past Medical History:  Diagnosis Date  . Abdominal pain, right upper quadrant   . Arthritis    "all my joints" (11/28/2013)  . Blind left eye   . CAD (coronary artery disease)    a. Prior history of BMS to OM and RCA, subsequent cutting balloon angioplasty due to ISR in these distributions, also BMS placement with DES to LAD. b. Cath 01/2013: diffuse 3V CAD without clear culprit lesion, normal LVEF, mid LAD lesion noted, treat medically. c. Cath 11/28/2013 DES to mid LAD lesion with abnormal FFR, DES to distal LAD lesion, DES to prox RCA lesion with abnormal FFR  . Chronic lower back pain   . COPD (chronic obstructive pulmonary disease) ([place])   . DDD (degenerative disc disease)   . Depression   .  GERD (gastroesophageal reflux disease)   . Hepatitis C C   "had the tx and I'm free of it"  . History of blood transfusion    "when I was taking tx for hepatitis"  . Hyperlipidemia   . Hypertension   . Kidney stones    "no surgeries"  . Macular degeneration of both eyes    "already blind in my left eye"  . Myocardial infarction ([place]) ~ 2007   . Other and unspecified angina pectoris   . Sinus bradycardia          Past Surgical History:  Procedure Laterality Date  . CARDIAC CATHETERIZATION     "I've had a few without intervention"  .  COLONOSCOPY    . COLONOSCOPY N/A 05/02/2013   Procedure: COLONOSCOPY;  Surgeon: Rogene Houston, MD;  [facility];  Service: Endoscopy;  Laterality: N/A;  1030-moved to 1055 Ann to notify pt  . COLONOSCOPY N/A 07/06/2017   Procedure: COLONOSCOPY;  Surgeon: Rogene Houston, MD;  Location: [facility];  Service: Endoscopy;  Laterality: N/A;  12:45  . CORONARY ANGIOPLASTY WITH STENT PLACEMENT   2007; 2008; 11/28/2013   "2 + 2; + 3"  . INGUINAL HERNIA REPAIR Bilateral ~ 2000  . LEFT HEART CATH AND CORONARY ANGIOGRAPHY N/A 07/12/2018   Procedure: LEFT HEART CATH AND CORONARY ANGIOGRAPHY;  Surgeon: Jettie Booze, MD;  Location: [facility];  Service: Cardiovascular;  Laterality: N/A;  . LEFT HEART CATHETERIZATION WITH CORONARY ANGIOGRAM N/A 01/30/2013   Procedure: LEFT HEART CATHETERIZATION WITH CORONARY ANGIOGRAM;  Surgeon: Jolaine Artist, MD;  Location: [facility] CATH LAB;  Service: Cardiovascular;  Laterality: N/A;  . LEFT HEART CATHETERIZATION WITH CORONARY ANGIOGRAM N/A 11/28/2013   Procedure: LEFT HEART CATHETERIZATION WITH CORONARY ANGIOGRAM;  Surgeon: Jettie Booze, MD;  Location: [facility] CATH LAB;  Service: Cardiovascular;  Laterality: N/A;  . LIVER BIOPSY  03/24/2010  . POLYPECTOMY  07/06/2017   Procedure: POLYPECTOMY;  Surgeon: Rogene Houston, MD;  Location: [facility];  Service: Endoscopy;;  colon         Family History  Problem Relation Age of Onset  . Diabetes Mother   . Diabetes Brother   . Heart disease Brother   . Diabetes Brother   . Healthy Brother   . Healthy Son   . Healthy Son     Social History Social History        Tobacco Use  . Smoking status: Former Smoker    Packs/day: 1.50    Years: 35.00    Pack years: 52.50    Types: Cigarettes    Start date: 07/20/1971    Last attempt to quit: 02/27/2006    Years since quitting: 12.3  . Smokeless tobacco: Never Used  Substance Use Topics  . Alcohol use:  No    Alcohol/week: 0.0 standard drinks  . Drug use: No    Comment: 11/28/2013 "did qthing in my teens and 20's; none since"          Current Outpatient Medications  Medication Sig Dispense Refill  . amLODipine (NORVASC) 10 MG tablet Take 10 mg by mouth daily.    [name] aspirin 81 MG tablet Take 1 tablet (81 mg total) by mouth daily. 30 tablet   . carvedilol (COREG) 6.25 MG tablet Take 6.25 mg by mouth 2 (two) times daily with a meal.    . clopidogrel (PLAVIX) 75 MG tablet Take 75 mg by mouth daily.  30 tablet 6  .  cyclobenzaprine (FLEXERIL) 10 MG tablet Take 10 mg by mouth at bedtime.     [name] escitalopram (LEXAPRO) 20 MG tablet Take 20 mg by mouth daily.     . furosemide (LASIX) 20 MG tablet Take 20 mg by mouth daily.     [name] gabapentin (NEURONTIN) 300 MG capsule Take 600 mg by mouth 3 (three) times daily.     [name] levothyroxine (SYNTHROID, LEVOTHROID) 75 MCG tablet Take 75 mcg by mouth daily before breakfast.    . lisinopril (PRINIVIL,ZESTRIL) 5 MG tablet Take 5 mg by mouth daily.    [name] lovastatin (MEVACOR) 20 MG tablet Take 20 mg by mouth at bedtime.    . nitroGLYCERIN (NITROSTAT) 0.4 MG SL tablet Place 1 tablet (0.4 mg total) under the tongue every 5 (five) minutes x 3 doses as needed. (Patient taking differently: Place 0.4 mg under the tongue every 5 (five) minutes x 3 doses as needed for chest pain. ) 25 tablet 3  . oxyCODONE-acetaminophen (PERCOCET) 10-325 MG tablet Take 1 tablet by mouth every 6 (six) hours as needed for pain.    . potassium chloride (K-DUR) 10 MEQ tablet Take 10 mEq by mouth daily.    . tamsulosin (FLOMAX) 0.4 MG CAPS capsule Take 0.4 mg by mouth daily.     . traZODone (DESYREL) 100 MG tablet Take 100 mg by mouth at bedtime.     No current facility-administered medications for this visit.          Allergies  Allergen Reactions  . Codeine Itching  . Imdur [Isosorbide Nitrate] Other (See Comments)    Has not tolerated well in the past    . Penicillins Other (See Comments)    Chest pain Has patient had a PCN reaction causing immediate rash, facial/tongue/throat swelling, SOB or lightheadedness with hypotension: yes Has patient had a PCN reaction causing severe rash involving mucus membranes or skin necrosis: no Has patient had a PCN reaction that required hospitalization: no Has patient had a PCN reaction occurring within the last 10 years: yes If all of the above answers are "NO", then may proceed with Cephalosporin use.   . Food Rash and Other (See Comments)    PEACHES    Review of Systems  Constitutional: Positive for fatigue.  HENT: Positive for dental problem.        Loose teeth.  Eyes:       Macular degeneration with vision loss.  Respiratory: Positive for shortness of breath.   Cardiovascular: Negative.   Gastrointestinal: Negative.   Endocrine: Negative.   Genitourinary:       Kidney stones  Musculoskeletal: Positive for arthralgias and back pain.  Skin: Negative.   Allergic/Immunologic: Negative.   Neurological: Negative.   Hematological: Bruises/bleeds easily.  Psychiatric/Behavioral:       Anxiety and depression    There were no vitals taken for this visit. Physical Exam Constitutional:      Appearance: Normal appearance.  HENT:     Head: Normocephalic and atraumatic.     Mouth/Throat:     Comments: Poor dentition with multiple missing teeth Eyes:     Extraocular Movements: Extraocular movements intact.     Conjunctiva/sclera: Conjunctivae normal.     Pupils: Pupils are equal, round, and reactive to light.  Neck:     Musculoskeletal: Normal range of motion.  Cardiovascular:     Rate and Rhythm: Normal rate and regular rhythm.     Pulses: Normal pulses.     Heart sounds: Normal heart sounds.  No murmur.  Pulmonary:     Effort: Pulmonary effort is normal.     Breath sounds: Normal breath sounds.  Abdominal:     General: Abdomen is flat. Bowel sounds are normal. There is no  distension.     Palpations: Abdomen is soft.     Tenderness: There is no abdominal tenderness.  Musculoskeletal: Normal range of motion.        General: No swelling.  Lymphadenopathy:     Cervical: No cervical adenopathy.  Skin:    General: Skin is warm and dry.  Neurological:     General: No focal deficit present.     Mental Status: He is alert and oriented to person, place, and time.  Psychiatric:        Mood and Affect: Mood normal.      Diagnostic Tests:  Physicians   Panel Physicians Referring Physician Case Authorizing Physician  Jettie Booze, MD (Primary)    Procedures   LEFT HEART CATH AND CORONARY ANGIOGRAPHY  Conclusion     Previously placed Prox RCA to Mid RCA stent (unknown type) is widely patent.  Mid LM to Dist LM lesion is 40% stenosed.  Mid RCA lesion is 10% stenosed.  Ost Ramus to Ramus lesion is 95% stenosed.  Ost 1st Mrg to 1st Mrg lesion is 95% stenosed.  Ost LAD lesion is 50% stenosed.  Previously placed Prox LAD stent (unknown type) is widely patent.  Ost 1st Diag to 1st Diag lesion is 99% stenosed.  Ost 2nd Diag to 2nd Diag lesion is 70% stenosed.  Mid LAD-1 lesion is 75% stenosed.  Previously placed Mid LAD-2 stent (unknown type) is widely patent.  Dist RCA lesion is 25% stenosed.  Dist Cx lesion is 90% stenosed.  The left ventricular systolic function is normal.  LV end diastolic pressure is normal.  The left ventricular ejection fraction is 55-65% by visual estimate.  There is no aortic valve stenosis.  Severe multivessel CAD. Multiple branches involved and moderate distal left main and ostial LAD disease as well. Severe mid LAD disease. CTO of large diagonal branch.   When compared to the 2015 cath, there has been significant progression of the mid LAD disease, branch vessel disease and mild progression of the distal left main disease.  Plan for Cardiac surgery consult for CABG.    Indications    Abnormal nuclear stress test [R94.39 (ICD-10-CM)]  Procedural Details   Technical Details The risks, benefits, and details of the procedure were explained to the patient. The patient verbalized understanding and wanted to proceed. Informed written consent was obtained.  PROCEDURE TECHNIQUE: After Xylocaine anesthesia a 87F slender sheath was placed in the right radial artery with a single anterior needle wall stick. IV Heparin was given. Right coronary angiography was done using a Judkins R4 guide catheter. Left coronary angiography was done using a Judkins L3.5 guide catheter. Left ventriculography was done using a JR4 catheter. A TR band was used for hemostasis.  Contrast: 75 cc Estimated blood loss <50 mL.   During this procedure medications were administered to achieve and maintain moderate conscious sedation while the patient's heart rate, blood pressure, and oxygen saturation were continuously monitored and I was present face-to-face 100% of this time.  Medications  (Filter: Administrations occurring from 07/12/18 0814 to 07/12/18 5093)          Medication Rate/Dose/Volume Action  Date Time   lidocaine (PF) (XYLOCAINE) 1 % injection (mL) 2 mL Given 07/12/18 0847   Total  dose as of 07/13/18 1153        2 mL        Radial Cocktail/Verapamil only (mL) 10 mL Given 07/12/18 0849   Total dose as of 07/13/18 1153        10 mL        fentaNYL (SUBLIMAZE) injection (mcg) 25 mcg Given 07/12/18 0841   Total dose as of 07/13/18 1153 25 mcg Given 0851   50 mcg        midazolam (VERSED) injection (mg) 2 mg Given 07/12/18 0841   Total dose as of 07/13/18 1153 1 mg Given 0852   3 mg        Heparin (Porcine) in NaCl 1000-0.9 UT/500ML-% SOLN (mL) 500 mL Given 07/12/18 0851   Total dose as of 07/13/18 1153 500 mL Given 0851   1,000 mL        heparin injection (Units) 4,000 Units Given 07/12/18 0851   Total dose as of 07/13/18 1153         4,000 Units        iohexol (OMNIPAQUE) 350 MG/ML injection (mL) 75 mL Given 07/12/18 0913   Total dose as of 07/13/18 1153        75 mL        Sedation Time   Sedation Time Physician-1: 31 minutes 42 seconds  Complications   Complications documented before study signed (07/12/2018 10:59 AM EST)    No complications were associated with this study.  Documented by Jettie Booze, MD - 07/12/2018 9:30 AM EST    Coronary Findings   Diagnostic  Dominance: Right  Left Main  Mid LM to Dist LM lesion 40% stenosed  Mid LM to Dist LM lesion is 40% stenosed. The lesion is eccentric.  Left Anterior Descending  Ost LAD lesion 50% stenosed  Ost LAD lesion is 50% stenosed.  Prox LAD lesion 0% stenosed  Previously placed Prox LAD stent (unknown type) is widely patent.  Mid LAD-1 lesion 75% stenosed  Mid LAD-1 lesion is 75% stenosed.  Mid LAD-2 lesion 0% stenosed  Previously placed Mid LAD-2 stent (unknown type) is widely patent.  First Diagonal Branch  Collaterals  1st Diag filled by collaterals from 3rd Diag.    Ost 1st Diag to 1st Diag lesion 99% stenosed  Ost 1st Diag to 1st Diag lesion is 99% stenosed.  Second Diagonal [employer] 2nd Diag to 2nd Diag lesion 70% stenosed  Ost 2nd Diag to 2nd Diag lesion is 70% stenosed.  Ramus Intermedius  Ost Ramus to Ramus lesion 95% stenosed  Ost Ramus to Ramus lesion is 95% stenosed.  Left Circumflex  Dist Cx lesion 90% stenosed  Dist Cx lesion is 90% stenosed.  First Obtuse Marginal Branch  Ost 1st Mrg to 1st Mrg lesion 95% stenosed  Ost 1st Mrg to 1st Mrg lesion is 95% stenosed. The lesion was previously treatedover 2 years ago.  Right Coronary Artery  Prox RCA to Mid RCA lesion 0% stenosed  Previously placed Prox RCA to Mid RCA stent (unknown type) is widely patent.  Mid RCA lesion 10% stenosed  Mid RCA lesion is 10% stenosed. The lesion was previously treatedover 2 years ago.  Dist RCA lesion 25% stenosed   Dist RCA lesion is 25% stenosed.  Intervention   No interventions have been documented.  Wall Motion        Resting       All segments of the heart are normal.  Left Heart   Left Ventricle The left ventricular size is normal. The left ventricular systolic function is normal. LV end diastolic pressure is normal. The left ventricular ejection fraction is 55-65% by visual estimate. No regional wall motion abnormalities.  Aortic Valve There is no aortic valve stenosis.  Coronary Diagrams   Diagnostic  Dominance: Right    Intervention   Implants    No implant documentation for this case.  Syngo Images      Show images for CARDIAC CATHETERIZATION  MERGE Images   Show images for CARDIAC CATHETERIZATION   Link to Procedure Log   Procedure Log    Hemo Data    Most Recent Value  AO Systolic Pressure 478 mmHg  AO Diastolic Pressure 65 mmHg  AO Mean 88 mmHg  LV Systolic Pressure 295 mmHg  LV Diastolic Pressure 7 mmHg  LV EDP 11 mmHg  AOp Systolic Pressure 621 mmHg  AOp Diastolic Pressure 67 mmHg  AOp Mean Pressure 91 mmHg  LVp Systolic Pressure 308 mmHg  LVp Diastolic Pressure 10 mmHg  LVp EDP Pressure 17 mmHg    Impression:  This 63 year old gentleman has severe three-vessel coronary disease status post multiple stenting procedures in the past.  He has pain in his left arm with numbness and tingling as well as exertional fatigue and shortness of breath of several months duration.  His nuclear stress test does show some inferior ischemia.  He has 95% hazy stenoses in the ramus and obtuse marginal branch as well as 75% LAD stenosis in 2 locations.  I think is probably best to proceed with coronary bypass graft surgery for relief of his symptoms and to prevent further ischemia and infarction.  It is not completely clear that his left arm symptoms are due to his heart and could certainly be due to some cervical spine disease.  Since this is only  been going on for several months and about the same time as his fatigue and shortness of breath I think it is more likely to be due to to coronary ischemia.  I reviewed the cardiac catheterization films with the patient and his wife and answered their questions. I discussed the operative procedure with the patient and his wife including alternatives, benefits and risks; including but not limited to bleeding, blood transfusion, infection, stroke, myocardial infarction, graft failure, heart block requiring a permanent pacemaker, organ dysfunction, and death.  KAYTON DUNAJ understands and agrees to proceed.     Plan:  Coronary artery bypass graft surgery.   Gaye Pollack, MD [facility] [phone]

## 2018-07-24 ENCOUNTER — Inpatient Hospital Stay (HOSPITAL_COMMUNITY): Payer: Medicare HMO

## 2018-07-24 ENCOUNTER — Inpatient Hospital Stay (HOSPITAL_COMMUNITY): Payer: Medicare HMO | Admitting: Physician Assistant

## 2018-07-24 ENCOUNTER — Encounter (HOSPITAL_COMMUNITY): Payer: Self-pay

## 2018-07-24 ENCOUNTER — Other Ambulatory Visit: Payer: Self-pay

## 2018-07-24 ENCOUNTER — Inpatient Hospital Stay (HOSPITAL_COMMUNITY)
Admission: RE | Admit: 2018-07-24 | Discharge: 2018-07-29 | DRG: 236 | Disposition: A | Discharge status: Home / Self Care | Payer: Medicare HMO | Attending: Surgery | Admitting: Surgery

## 2018-07-24 ENCOUNTER — Inpatient Hospital Stay (HOSPITAL_COMMUNITY): Payer: Medicare HMO | Admitting: Anesthesiology

## 2018-07-24 ENCOUNTER — Inpatient Hospital Stay (HOSPITAL_COMMUNITY)
Admission: RE | Disposition: A | Discharge status: Home / Self Care | Payer: Self-pay | Source: Home / Self Care | Attending: Surgery

## 2018-07-24 DIAGNOSIS — Z951 Presence of aortocoronary bypass graft: Secondary | ICD-10-CM

## 2018-07-24 DIAGNOSIS — D62 Acute posthemorrhagic anemia: Secondary | ICD-10-CM | POA: Diagnosis not present

## 2018-07-24 DIAGNOSIS — Z87442 Personal history of urinary calculi: Secondary | ICD-10-CM

## 2018-07-24 DIAGNOSIS — K219 Gastro-esophageal reflux disease without esophagitis: Secondary | ICD-10-CM | POA: Diagnosis not present

## 2018-07-24 DIAGNOSIS — Z880 Allergy status to penicillin: Secondary | ICD-10-CM

## 2018-07-24 DIAGNOSIS — Z8249 Family history of ischemic heart disease and other diseases of the circulatory system: Secondary | ICD-10-CM | POA: Diagnosis not present

## 2018-07-24 DIAGNOSIS — I340 Nonrheumatic mitral (valve) insufficiency: Secondary | ICD-10-CM | POA: Diagnosis not present

## 2018-07-24 DIAGNOSIS — Z87891 Personal history of nicotine dependence: Secondary | ICD-10-CM | POA: Diagnosis not present

## 2018-07-24 DIAGNOSIS — Z955 Presence of coronary angioplasty implant and graft: Secondary | ICD-10-CM | POA: Diagnosis not present

## 2018-07-24 DIAGNOSIS — Z8619 Personal history of other infectious and parasitic diseases: Secondary | ICD-10-CM

## 2018-07-24 DIAGNOSIS — E039 Hypothyroidism, unspecified: Secondary | ICD-10-CM | POA: Diagnosis present

## 2018-07-24 DIAGNOSIS — I10 Essential (primary) hypertension: Secondary | ICD-10-CM | POA: Diagnosis not present

## 2018-07-24 DIAGNOSIS — Z885 Allergy status to narcotic agent status: Secondary | ICD-10-CM

## 2018-07-24 DIAGNOSIS — I2510 Atherosclerotic heart disease of native coronary artery without angina pectoris: Secondary | ICD-10-CM | POA: Diagnosis not present

## 2018-07-24 DIAGNOSIS — Z91018 Allergy to other foods: Secondary | ICD-10-CM

## 2018-07-24 DIAGNOSIS — Z7902 Long term (current) use of antithrombotics/antiplatelets: Secondary | ICD-10-CM | POA: Diagnosis not present

## 2018-07-24 DIAGNOSIS — J449 Chronic obstructive pulmonary disease, unspecified: Secondary | ICD-10-CM | POA: Diagnosis present

## 2018-07-24 DIAGNOSIS — T380X5A Adverse effect of glucocorticoids and synthetic analogues, initial encounter: Secondary | ICD-10-CM | POA: Diagnosis not present

## 2018-07-24 DIAGNOSIS — G8929 Other chronic pain: Secondary | ICD-10-CM | POA: Diagnosis not present

## 2018-07-24 DIAGNOSIS — H3530 Unspecified macular degeneration: Secondary | ICD-10-CM | POA: Diagnosis present

## 2018-07-24 DIAGNOSIS — Z833 Family history of diabetes mellitus: Secondary | ICD-10-CM | POA: Diagnosis not present

## 2018-07-24 DIAGNOSIS — E782 Mixed hyperlipidemia: Secondary | ICD-10-CM | POA: Diagnosis present

## 2018-07-24 DIAGNOSIS — Z79899 Other long term (current) drug therapy: Secondary | ICD-10-CM

## 2018-07-24 DIAGNOSIS — M545 Low back pain: Secondary | ICD-10-CM | POA: Diagnosis present

## 2018-07-24 DIAGNOSIS — Z79890 Hormone replacement therapy (postmenopausal): Secondary | ICD-10-CM

## 2018-07-24 DIAGNOSIS — Z7982 Long term (current) use of aspirin: Secondary | ICD-10-CM

## 2018-07-24 DIAGNOSIS — R21 Rash and other nonspecific skin eruption: Secondary | ICD-10-CM | POA: Diagnosis not present

## 2018-07-24 DIAGNOSIS — F329 Major depressive disorder, single episode, unspecified: Secondary | ICD-10-CM | POA: Diagnosis present

## 2018-07-24 DIAGNOSIS — J90 Pleural effusion, not elsewhere classified: Secondary | ICD-10-CM | POA: Diagnosis not present

## 2018-07-24 DIAGNOSIS — J9811 Atelectasis: Secondary | ICD-10-CM | POA: Diagnosis not present

## 2018-07-24 DIAGNOSIS — I252 Old myocardial infarction: Secondary | ICD-10-CM | POA: Diagnosis not present

## 2018-07-24 DIAGNOSIS — E785 Hyperlipidemia, unspecified: Secondary | ICD-10-CM | POA: Diagnosis not present

## 2018-07-24 DIAGNOSIS — M159 Polyosteoarthritis, unspecified: Secondary | ICD-10-CM | POA: Diagnosis present

## 2018-07-24 HISTORY — PX: CORONARY ARTERY BYPASS GRAFT: SHX141

## 2018-07-24 HISTORY — PX: TEE WITHOUT CARDIOVERSION: SHX5443

## 2018-07-24 LAB — POCT I-STAT 7, (LYTES, BLD GAS, ICA,H+H)
ACID-BASE DEFICIT: 2 mmol/L (ref 0.0–2.0)
Acid-Base Excess: 2 mmol/L (ref 0.0–2.0)
Acid-base deficit: 2 mmol/L (ref 0.0–2.0)
Acid-base deficit: 3 mmol/L — ABNORMAL HIGH (ref 0.0–2.0)
Bicarbonate: 26.8 mmol/L (ref 20.0–28.0)
Bicarbonate: 24.7 mmol/L (ref 20.0–28.0)
Bicarbonate: 22.6 mmol/L (ref 20.0–28.0)
Bicarbonate: 22.4 mmol/L (ref 20.0–28.0)
CALCIUM ION: 1.21 mmol/L (ref 1.15–1.40)
Calcium, Ion: 0.99 mmol/L — ABNORMAL LOW (ref 1.15–1.40)
Calcium, Ion: 1.17 mmol/L (ref 1.15–1.40)
Calcium, Ion: 1.17 mmol/L (ref 1.15–1.40)
HCT: 22 % — ABNORMAL LOW (ref 39.0–52.0)
HCT: 26 % — ABNORMAL LOW (ref 39.0–52.0)
HCT: 24 % — ABNORMAL LOW (ref 39.0–52.0)
HCT: 25 % — ABNORMAL LOW (ref 39.0–52.0)
Hemoglobin: 7.5 g/dL — ABNORMAL LOW (ref 13.0–17.0)
Hemoglobin: 8.8 g/dL — ABNORMAL LOW (ref 13.0–17.0)
Hemoglobin: 8.2 g/dL — ABNORMAL LOW (ref 13.0–17.0)
Hemoglobin: 8.5 g/dL — ABNORMAL LOW (ref 13.0–17.0)
O2 Saturation: 100 %
O2 Saturation: 99 %
O2 Saturation: 99 %
O2 Saturation: 99 %
PCO2 ART: 39.9 mmHg (ref 32.0–48.0)
PH ART: 7.375 (ref 7.350–7.450)
PO2 ART: 375 mmHg — AB (ref 83.0–108.0)
Patient temperature: 35.5
Patient temperature: 37
Patient temperature: 37.5
Potassium: 3.9 mmol/L (ref 3.5–5.1)
Potassium: 3.3 mmol/L — ABNORMAL LOW (ref 3.5–5.1)
Potassium: 4.8 mmol/L (ref 3.5–5.1)
Potassium: 5 mmol/L (ref 3.5–5.1)
SODIUM: 141 mmol/L (ref 135–145)
Sodium: 141 mmol/L (ref 135–145)
Sodium: 140 mmol/L (ref 135–145)
Sodium: 140 mmol/L (ref 135–145)
TCO2: 28 mmol/L (ref 22–32)
TCO2: 26 mmol/L (ref 22–32)
TCO2: 24 mmol/L (ref 22–32)
TCO2: 24 mmol/L (ref 22–32)
pCO2 arterial: 41.4 mmHg (ref 32.0–48.0)
pCO2 arterial: 44.8 mmHg (ref 32.0–48.0)
pCO2 arterial: 38.6 mmHg (ref 32.0–48.0)
pH, Arterial: 7.42 (ref 7.350–7.450)
pH, Arterial: 7.342 — ABNORMAL LOW (ref 7.350–7.450)
pH, Arterial: 7.359 (ref 7.350–7.450)
pO2, Arterial: 121 mmHg — ABNORMAL HIGH (ref 83.0–108.0)
pO2, Arterial: 158 mmHg — ABNORMAL HIGH (ref 83.0–108.0)
pO2, Arterial: 129 mmHg — ABNORMAL HIGH (ref 83.0–108.0)

## 2018-07-24 LAB — GLUCOSE, CAPILLARY
GLUCOSE-CAPILLARY: 114 mg/dL — AB (ref 70–99)
GLUCOSE-CAPILLARY: 120 mg/dL — AB (ref 70–99)
GLUCOSE-CAPILLARY: 130 mg/dL — AB (ref 70–99)
Glucose-Capillary: 113 mg/dL — ABNORMAL HIGH (ref 70–99)
Glucose-Capillary: 115 mg/dL — ABNORMAL HIGH (ref 70–99)
Glucose-Capillary: 98 mg/dL (ref 70–99)
Glucose-Capillary: 122 mg/dL — ABNORMAL HIGH (ref 70–99)
Glucose-Capillary: 117 mg/dL — ABNORMAL HIGH (ref 70–99)
Glucose-Capillary: 177 mg/dL — ABNORMAL HIGH (ref 70–99)
Glucose-Capillary: 146 mg/dL — ABNORMAL HIGH (ref 70–99)

## 2018-07-24 LAB — CBC
HCT: 28.3 % — ABNORMAL LOW (ref 39.0–52.0)
HCT: 26.9 % — ABNORMAL LOW (ref 39.0–52.0)
Hemoglobin: 9.4 g/dL — ABNORMAL LOW (ref 13.0–17.0)
Hemoglobin: 9.1 g/dL — ABNORMAL LOW (ref 13.0–17.0)
MCH: 30.2 pg (ref 26.0–34.0)
MCH: 30.2 pg (ref 26.0–34.0)
MCHC: 33.2 g/dL (ref 30.0–36.0)
MCHC: 33.8 g/dL (ref 30.0–36.0)
MCV: 91 fL (ref 80.0–100.0)
MCV: 89.4 fL (ref 80.0–100.0)
Platelets: 141 10*3/uL — ABNORMAL LOW (ref 150–400)
Platelets: 143 10*3/uL — ABNORMAL LOW (ref 150–400)
RBC: 3.11 MIL/uL — AB (ref 4.22–5.81)
RBC: 3.01 MIL/uL — ABNORMAL LOW (ref 4.22–5.81)
RDW: 13.8 % (ref 11.5–15.5)
RDW: 13.6 % (ref 11.5–15.5)
WBC: 9.7 10*3/uL (ref 4.0–10.5)
WBC: 10.7 10*3/uL — ABNORMAL HIGH (ref 4.0–10.5)
nRBC: 0 % (ref 0.0–0.2)
nRBC: 0 % (ref 0.0–0.2)

## 2018-07-24 LAB — POCT I-STAT 4, (NA,K, GLUC, HGB,HCT)
Glucose, Bld: 106 mg/dL — ABNORMAL HIGH (ref 70–99)
Glucose, Bld: 97 mg/dL (ref 70–99)
Glucose, Bld: 160 mg/dL — ABNORMAL HIGH (ref 70–99)
Glucose, Bld: 147 mg/dL — ABNORMAL HIGH (ref 70–99)
HCT: 30 % — ABNORMAL LOW (ref 39.0–52.0)
HCT: 27 % — ABNORMAL LOW (ref 39.0–52.0)
HCT: 26 % — ABNORMAL LOW (ref 39.0–52.0)
HCT: 24 % — ABNORMAL LOW (ref 39.0–52.0)
HEMOGLOBIN: 8.8 g/dL — AB (ref 13.0–17.0)
Hemoglobin: 10.2 g/dL — ABNORMAL LOW (ref 13.0–17.0)
Hemoglobin: 9.2 g/dL — ABNORMAL LOW (ref 13.0–17.0)
Hemoglobin: 8.2 g/dL — ABNORMAL LOW (ref 13.0–17.0)
POTASSIUM: 3.6 mmol/L (ref 3.5–5.1)
POTASSIUM: 3.9 mmol/L (ref 3.5–5.1)
Potassium: 3.8 mmol/L (ref 3.5–5.1)
Potassium: 3.4 mmol/L — ABNORMAL LOW (ref 3.5–5.1)
Sodium: 141 mmol/L (ref 135–145)
Sodium: 140 mmol/L (ref 135–145)
Sodium: 138 mmol/L (ref 135–145)
Sodium: 139 mmol/L (ref 135–145)

## 2018-07-24 LAB — PLATELET COUNT: Platelets: 184 10*3/uL (ref 150–400)

## 2018-07-24 LAB — PROTIME-INR
INR: 1.27
Prothrombin Time: 15.8 seconds — ABNORMAL HIGH (ref 11.4–15.2)

## 2018-07-24 LAB — APTT: aPTT: 33 seconds (ref 24–36)

## 2018-07-24 LAB — HEMOGLOBIN AND HEMATOCRIT, BLOOD
HCT: 27.3 % — ABNORMAL LOW (ref 39.0–52.0)
Hemoglobin: 8.8 g/dL — ABNORMAL LOW (ref 13.0–17.0)

## 2018-07-24 LAB — MAGNESIUM: Magnesium: 3 mg/dL — ABNORMAL HIGH (ref 1.7–2.4)

## 2018-07-24 LAB — PREPARE RBC (CROSSMATCH)

## 2018-07-24 LAB — CREATININE, SERUM
Creatinine, Ser: 0.94 mg/dL (ref 0.61–1.24)
GFR calc Af Amer: >60 mL/min (ref >60)

## 2018-07-24 SURGERY — CORONARY ARTERY BYPASS GRAFTING (CABG)
Anesthesia: General | Site: Chest

## 2018-07-24 MED ORDER — VECURONIUM BROMIDE 10 MG IV SOLR
INTRAVENOUS | Status: DC | PRN
  Administered 2018-07-24: 5 mg via INTRAVENOUS

## 2018-07-24 MED ORDER — LACTATED RINGERS IV SOLN
INTRAVENOUS | Status: DC | PRN
  Administered 2018-07-24: 07:00:00 via INTRAVENOUS

## 2018-07-24 MED ORDER — METOPROLOL TARTRATE 5 MG/5ML IV SOLN: 2.5000 mg | INTRAVENOUS | Status: DC | PRN

## 2018-07-24 MED ORDER — MIDAZOLAM HCL 2 MG/2ML IJ SOLN
INTRAMUSCULAR | Status: AC
  Filled 2018-07-24: qty 2

## 2018-07-24 MED ORDER — LEVOFLOXACIN IN D5W 750 MG/150ML IV SOLN
750.0000 mg | INTRAVENOUS | Status: AC
  Administered 2018-07-25: 750 mg via INTRAVENOUS
  Filled 2018-07-24: qty 150

## 2018-07-24 MED ORDER — ARTIFICIAL TEARS OPHTHALMIC OINT
TOPICAL_OINTMENT | OPHTHALMIC | Status: DC | PRN
  Administered 2018-07-24: 1 via OPHTHALMIC

## 2018-07-24 MED ORDER — ACETAMINOPHEN 500 MG PO TABS
1000.0000 mg | ORAL_TABLET | Freq: Four times a day (QID) | ORAL | Status: DC
  Administered 2018-07-24 – 2018-07-26 (×8): 1000 mg via ORAL
  Filled 2018-07-24 (×8): qty 2

## 2018-07-24 MED ORDER — ONDANSETRON HCL 4 MG/2ML IJ SOLN
INTRAMUSCULAR | Status: DC | PRN
  Administered 2018-07-24: 4 mg via INTRAVENOUS

## 2018-07-24 MED ORDER — PHENYLEPHRINE 40 MCG/ML (10ML) SYRINGE FOR IV PUSH (FOR BLOOD PRESSURE SUPPORT)
PREFILLED_SYRINGE | INTRAVENOUS | Status: AC
  Filled 2018-07-24: qty 30

## 2018-07-24 MED ORDER — METOPROLOL TARTRATE 25 MG/10 ML ORAL SUSPENSION: 12.5000 mg | Freq: Two times a day (BID) | ORAL | Status: DC

## 2018-07-24 MED ORDER — DEXAMETHASONE SODIUM PHOSPHATE 10 MG/ML IJ SOLN
INTRAMUSCULAR | Status: DC | PRN
  Administered 2018-07-24: 10 mg via INTRAVENOUS

## 2018-07-24 MED ORDER — CALCIUM CHLORIDE 10 % IV SOLN
INTRAVENOUS | Status: AC
  Filled 2018-07-24: qty 10

## 2018-07-24 MED ORDER — HEMOSTATIC AGENTS (NO CHARGE) OPTIME
TOPICAL | Status: DC | PRN
  Administered 2018-07-24: 1 via TOPICAL

## 2018-07-24 MED ORDER — MAGNESIUM SULFATE 4 GM/100ML IV SOLN
4.0000 g | Freq: Once | INTRAVENOUS | Status: AC
  Administered 2018-07-24: 4 g via INTRAVENOUS
  Filled 2018-07-24: qty 100

## 2018-07-24 MED ORDER — DEXMEDETOMIDINE HCL IN NACL 200 MCG/50ML IV SOLN
0.0000 ug/kg/h | INTRAVENOUS | Status: DC
  Administered 2018-07-24: 0.5 ug/kg/h via INTRAVENOUS
  Filled 2018-07-24: qty 50

## 2018-07-24 MED ORDER — MIDAZOLAM HCL (PF) 10 MG/2ML IJ SOLN
INTRAMUSCULAR | Status: AC
  Filled 2018-07-24: qty 2

## 2018-07-24 MED ORDER — METOCLOPRAMIDE HCL 5 MG/ML IJ SOLN
10.0000 mg | Freq: Four times a day (QID) | INTRAMUSCULAR | Status: AC
  Administered 2018-07-24 – 2018-07-25 (×3): 10 mg via INTRAVENOUS
  Filled 2018-07-24 (×2): qty 2

## 2018-07-24 MED ORDER — CHLORHEXIDINE GLUCONATE 0.12% ORAL RINSE (MEDLINE KIT)
15.0000 mL | Freq: Two times a day (BID) | OROMUCOSAL | Status: DC
  Administered 2018-07-24 – 2018-07-25 (×2): 15 mL via OROMUCOSAL

## 2018-07-24 MED ORDER — NITROGLYCERIN IN D5W 200-5 MCG/ML-% IV SOLN: 0.0000 ug/min | INTRAVENOUS | Status: DC

## 2018-07-24 MED ORDER — PHENYLEPHRINE HCL-NACL 20-0.9 MG/250ML-% IV SOLN: 0.0000 ug/min | INTRAVENOUS | Status: DC

## 2018-07-24 MED ORDER — INSULIN REGULAR(HUMAN) IN NACL 100-0.9 UT/100ML-% IV SOLN
INTRAVENOUS | Status: DC
  Administered 2018-07-24: 2.6 [IU]/h via INTRAVENOUS

## 2018-07-24 MED ORDER — METHYLPREDNISOLONE SODIUM SUCC 40 MG IJ SOLR
40.0000 mg | Freq: Once | INTRAMUSCULAR | Status: AC
  Administered 2018-07-24: 40 mg via INTRAVENOUS
  Filled 2018-07-24: qty 1

## 2018-07-24 MED ORDER — CHLORHEXIDINE GLUCONATE 0.12 % MT SOLN
15.0000 mL | OROMUCOSAL | Status: AC
  Administered 2018-07-24: 15 mL via OROMUCOSAL

## 2018-07-24 MED ORDER — ROCURONIUM BROMIDE 100 MG/10ML IV SOLN
INTRAVENOUS | Status: DC | PRN
  Administered 2018-07-24: 100 mg via INTRAVENOUS
  Administered 2018-07-24: 5 mg via INTRAVENOUS

## 2018-07-24 MED ORDER — ALBUMIN HUMAN 5 % IV SOLN
250.0000 mL | INTRAVENOUS | Status: AC | PRN
  Administered 2018-07-24 (×3): 12.5 g via INTRAVENOUS
  Filled 2018-07-24: qty 250

## 2018-07-24 MED ORDER — ASPIRIN EC 325 MG PO TBEC
325.0000 mg | DELAYED_RELEASE_TABLET | Freq: Every day | ORAL | Status: DC
  Administered 2018-07-25: 325 mg via ORAL
  Filled 2018-07-24: qty 1

## 2018-07-24 MED ORDER — SODIUM CHLORIDE 0.9 % IV SOLN
INTRAVENOUS | Status: DC | PRN
  Administered 2018-07-24: 20 ug/min via INTRAVENOUS

## 2018-07-24 MED ORDER — PROTAMINE SULFATE 10 MG/ML IV SOLN
INTRAVENOUS | Status: AC
  Filled 2018-07-24: qty 25

## 2018-07-24 MED ORDER — ACETAMINOPHEN 650 MG RE SUPP
650.0000 mg | Freq: Once | RECTAL | Status: AC
  Administered 2018-07-24: 650 mg via RECTAL

## 2018-07-24 MED ORDER — SODIUM CHLORIDE 0.9% IV SOLUTION: Freq: Once | INTRAVENOUS | Status: DC

## 2018-07-24 MED ORDER — CHLORHEXIDINE GLUCONATE CLOTH 2 % EX PADS
6.0000 | MEDICATED_PAD | Freq: Every day | CUTANEOUS | Status: DC
  Administered 2018-07-24 – 2018-07-25 (×2): 6 via TOPICAL

## 2018-07-24 MED ORDER — ASPIRIN 81 MG PO CHEW
324.0000 mg | CHEWABLE_TABLET | Freq: Every day | ORAL | Status: DC
  Administered 2018-07-26: 324 mg
  Filled 2018-07-24: qty 4

## 2018-07-24 MED ORDER — LEVALBUTEROL HCL 0.63 MG/3ML IN NEBU
INHALATION_SOLUTION | RESPIRATORY_TRACT | Status: AC
  Administered 2018-07-24: 13:00:00
  Filled 2018-07-24: qty 3

## 2018-07-24 MED ORDER — VANCOMYCIN HCL IN DEXTROSE 1-5 GM/200ML-% IV SOLN
1000.0000 mg | Freq: Once | INTRAVENOUS | Status: AC
  Administered 2018-07-24: 1000 mg via INTRAVENOUS
  Filled 2018-07-24: qty 200

## 2018-07-24 MED ORDER — GABAPENTIN 300 MG PO CAPS
600.0000 mg | ORAL_CAPSULE | Freq: Three times a day (TID) | ORAL | Status: DC
  Administered 2018-07-25 – 2018-07-29 (×13): 600 mg via ORAL
  Filled 2018-07-24 (×13): qty 2

## 2018-07-24 MED ORDER — INSULIN REGULAR BOLUS VIA INFUSION
0.0000 [IU] | Freq: Three times a day (TID) | INTRAVENOUS | Status: DC
  Filled 2018-07-24: qty 10

## 2018-07-24 MED ORDER — MIDAZOLAM HCL 2 MG/2ML IJ SOLN: 2.0000 mg | INTRAMUSCULAR | Status: DC | PRN

## 2018-07-24 MED ORDER — MORPHINE SULFATE (PF) 2 MG/ML IV SOLN
1.0000 mg | INTRAVENOUS | Status: DC | PRN
  Administered 2018-07-24: 2 mg via INTRAVENOUS
  Administered 2018-07-24 – 2018-07-25 (×4): 4 mg via INTRAVENOUS
  Administered 2018-07-25 (×3): 2 mg via INTRAVENOUS
  Administered 2018-07-25: 4 mg via INTRAVENOUS
  Administered 2018-07-25 – 2018-07-26 (×3): 2 mg via INTRAVENOUS
  Filled 2018-07-24: qty 2
  Filled 2018-07-24: qty 1
  Filled 2018-07-24: qty 2
  Filled 2018-07-24 (×4): qty 1
  Filled 2018-07-24 (×2): qty 2
  Filled 2018-07-24: qty 1
  Filled 2018-07-24: qty 2
  Filled 2018-07-24: qty 1

## 2018-07-24 MED ORDER — LACTATED RINGERS IV SOLN
500.0000 mL | Freq: Once | INTRAVENOUS | Status: AC | PRN
  Administered 2018-07-24: 500 mL via INTRAVENOUS

## 2018-07-24 MED ORDER — METOPROLOL TARTRATE 12.5 MG HALF TABLET: 12.5000 mg | ORAL_TABLET | Freq: Once | ORAL | Status: DC

## 2018-07-24 MED ORDER — TRAMADOL HCL 50 MG PO TABS
50.0000 mg | ORAL_TABLET | ORAL | Status: DC | PRN
  Administered 2018-07-25 – 2018-07-26 (×2): 50 mg via ORAL
  Filled 2018-07-24 (×2): qty 1

## 2018-07-24 MED ORDER — POTASSIUM CHLORIDE 10 MEQ/50ML IV SOLN
10.0000 meq | Freq: Once | INTRAVENOUS | Status: AC
  Administered 2018-07-24: 10 meq via INTRAVENOUS

## 2018-07-24 MED ORDER — METOPROLOL TARTRATE 12.5 MG HALF TABLET
12.5000 mg | ORAL_TABLET | Freq: Two times a day (BID) | ORAL | Status: DC
  Administered 2018-07-25 – 2018-07-26 (×3): 12.5 mg via ORAL
  Filled 2018-07-24 (×4): qty 1

## 2018-07-24 MED ORDER — THROMBIN 5000 UNITS EX SOLR
CUTANEOUS | Status: AC
  Filled 2018-07-24: qty 15000

## 2018-07-24 MED ORDER — FENTANYL CITRATE (PF) 250 MCG/5ML IJ SOLN
INTRAMUSCULAR | Status: AC
  Filled 2018-07-24: qty 5

## 2018-07-24 MED ORDER — BISACODYL 10 MG RE SUPP: 10.0000 mg | Freq: Every day | RECTAL | Status: DC

## 2018-07-24 MED ORDER — SODIUM CHLORIDE 0.9% FLUSH: 10.0000 mL | INTRAVENOUS | Status: DC | PRN

## 2018-07-24 MED ORDER — ORAL CARE MOUTH RINSE
15.0000 mL | OROMUCOSAL | Status: DC
  Administered 2018-07-24 (×2): 15 mL via OROMUCOSAL

## 2018-07-24 MED ORDER — HEPARIN SODIUM (PORCINE) 1000 UNIT/ML IJ SOLN
INTRAMUSCULAR | Status: DC | PRN
  Administered 2018-07-24: 27000 [IU] via INTRAVENOUS

## 2018-07-24 MED ORDER — LACTATED RINGERS IV SOLN: INTRAVENOUS | Status: DC

## 2018-07-24 MED ORDER — LEVOTHYROXINE SODIUM 75 MCG PO TABS
75.0000 ug | ORAL_TABLET | Freq: Every day | ORAL | Status: DC
  Administered 2018-07-25 – 2018-07-29 (×5): 75 ug via ORAL
  Filled 2018-07-24 (×5): qty 1

## 2018-07-24 MED ORDER — ACETAMINOPHEN 160 MG/5ML PO SOLN: 1000.0000 mg | Freq: Four times a day (QID) | ORAL | Status: DC

## 2018-07-24 MED ORDER — DOCUSATE SODIUM 100 MG PO CAPS
200.0000 mg | ORAL_CAPSULE | Freq: Every day | ORAL | Status: DC
  Administered 2018-07-25 – 2018-07-26 (×2): 200 mg via ORAL
  Filled 2018-07-24 (×2): qty 2

## 2018-07-24 MED ORDER — PROPOFOL 10 MG/ML IV BOLUS
INTRAVENOUS | Status: DC | PRN
  Administered 2018-07-24: 60 mg via INTRAVENOUS
  Administered 2018-07-24: 80 mg via INTRAVENOUS

## 2018-07-24 MED ORDER — ESCITALOPRAM OXALATE 20 MG PO TABS
20.0000 mg | ORAL_TABLET | Freq: Every day | ORAL | Status: DC
  Administered 2018-07-25 – 2018-07-28 (×4): 20 mg via ORAL
  Filled 2018-07-24: qty 1
  Filled 2018-07-24: qty 2
  Filled 2018-07-24 (×4): qty 1
  Filled 2018-07-24 (×3): qty 2
  Filled 2018-07-24: qty 1

## 2018-07-24 MED ORDER — PHENYLEPHRINE 40 MCG/ML (10ML) SYRINGE FOR IV PUSH (FOR BLOOD PRESSURE SUPPORT)
PREFILLED_SYRINGE | INTRAVENOUS | Status: DC | PRN
  Administered 2018-07-24 (×2): 80 ug via INTRAVENOUS
  Administered 2018-07-24 (×2): 40 ug via INTRAVENOUS
  Administered 2018-07-24: 80 ug via INTRAVENOUS

## 2018-07-24 MED ORDER — LEVALBUTEROL HCL 0.63 MG/3ML IN NEBU
0.6300 mg | INHALATION_SOLUTION | Freq: Four times a day (QID) | RESPIRATORY_TRACT | Status: DC
  Administered 2018-07-25 – 2018-07-26 (×5): 0.63 mg via RESPIRATORY_TRACT
  Filled 2018-07-24 (×7): qty 3

## 2018-07-24 MED ORDER — FENTANYL CITRATE (PF) 250 MCG/5ML IJ SOLN
INTRAMUSCULAR | Status: AC
  Filled 2018-07-24: qty 25

## 2018-07-24 MED ORDER — CALCIUM CHLORIDE 10 % IV SOLN
INTRAVENOUS | Status: DC | PRN
  Administered 2018-07-24: 100 mg via INTRAVENOUS

## 2018-07-24 MED ORDER — PROTAMINE SULFATE 10 MG/ML IV SOLN
INTRAVENOUS | Status: DC | PRN
  Administered 2018-07-24: 110 mg via INTRAVENOUS
  Administered 2018-07-24: 40 mg via INTRAVENOUS
  Administered 2018-07-24 (×2): 60 mg via INTRAVENOUS

## 2018-07-24 MED ORDER — PRAVASTATIN SODIUM 10 MG PO TABS
20.0000 mg | ORAL_TABLET | Freq: Every day | ORAL | Status: DC
  Administered 2018-07-25: 20 mg via ORAL
  Filled 2018-07-24: qty 2

## 2018-07-24 MED ORDER — CHLORHEXIDINE GLUCONATE 4 % EX LIQD: 30.0000 mL | CUTANEOUS | Status: DC

## 2018-07-24 MED ORDER — SODIUM CHLORIDE 0.9 % IV SOLN
INTRAVENOUS | Status: DC
  Administered 2018-07-24: 13:00:00 via INTRAVENOUS

## 2018-07-24 MED ORDER — ALBUMIN HUMAN 5 % IV SOLN
INTRAVENOUS | Status: DC | PRN
  Administered 2018-07-24 (×2): via INTRAVENOUS

## 2018-07-24 MED ORDER — ARTIFICIAL TEARS OPHTHALMIC OINT
TOPICAL_OINTMENT | OPHTHALMIC | Status: AC
  Filled 2018-07-24: qty 3.5

## 2018-07-24 MED ORDER — TAMSULOSIN HCL 0.4 MG PO CAPS
0.4000 mg | ORAL_CAPSULE | Freq: Every day | ORAL | Status: DC
  Administered 2018-07-25 – 2018-07-28 (×4): 0.4 mg via ORAL
  Filled 2018-07-24 (×5): qty 1

## 2018-07-24 MED ORDER — PANTOPRAZOLE SODIUM 40 MG PO TBEC
40.0000 mg | DELAYED_RELEASE_TABLET | Freq: Every day | ORAL | Status: DC
  Administered 2018-07-26: 40 mg via ORAL
  Filled 2018-07-24: qty 1

## 2018-07-24 MED ORDER — PROPOFOL 10 MG/ML IV BOLUS
INTRAVENOUS | Status: AC
  Filled 2018-07-24: qty 20

## 2018-07-24 MED ORDER — OXYCODONE HCL 5 MG PO TABS
5.0000 mg | ORAL_TABLET | ORAL | Status: DC | PRN
  Administered 2018-07-24: 5 mg via ORAL
  Administered 2018-07-25 – 2018-07-26 (×6): 10 mg via ORAL
  Filled 2018-07-24: qty 2
  Filled 2018-07-24 (×2): qty 1
  Filled 2018-07-24 (×4): qty 2
  Filled 2018-07-24: qty 1

## 2018-07-24 MED ORDER — CHLORHEXIDINE GLUCONATE 0.12 % MT SOLN
15.0000 mL | Freq: Once | OROMUCOSAL | Status: AC
  Administered 2018-07-24: 15 mL via OROMUCOSAL
  Filled 2018-07-24: qty 15

## 2018-07-24 MED ORDER — FENTANYL CITRATE (PF) 250 MCG/5ML IJ SOLN
INTRAMUSCULAR | Status: DC | PRN
  Administered 2018-07-24: 100 ug via INTRAVENOUS
  Administered 2018-07-24: 50 ug via INTRAVENOUS
  Administered 2018-07-24 (×2): 100 ug via INTRAVENOUS
  Administered 2018-07-24: 250 ug via INTRAVENOUS
  Administered 2018-07-24: 50 ug via INTRAVENOUS
  Administered 2018-07-24: 100 ug via INTRAVENOUS
  Administered 2018-07-24: 200 ug via INTRAVENOUS
  Administered 2018-07-24: 150 ug via INTRAVENOUS
  Administered 2018-07-24: 100 ug via INTRAVENOUS
  Administered 2018-07-24 (×2): 150 ug via INTRAVENOUS

## 2018-07-24 MED ORDER — DIPHENHYDRAMINE HCL 50 MG/ML IJ SOLN
INTRAMUSCULAR | Status: DC | PRN
  Administered 2018-07-24: 25 mg via INTRAVENOUS

## 2018-07-24 MED ORDER — THROMBIN 5000 UNITS EX SOLR
OROMUCOSAL | Status: DC | PRN
  Administered 2018-07-24 (×3): 3 mL via TOPICAL

## 2018-07-24 MED ORDER — SODIUM CHLORIDE 0.9 % IV SOLN: 250.0000 mL | INTRAVENOUS | Status: DC

## 2018-07-24 MED ORDER — BISACODYL 5 MG PO TBEC
10.0000 mg | DELAYED_RELEASE_TABLET | Freq: Every day | ORAL | Status: DC
  Administered 2018-07-25 – 2018-07-26 (×2): 10 mg via ORAL
  Filled 2018-07-24 (×2): qty 2

## 2018-07-24 MED ORDER — FAMOTIDINE IN NACL 20-0.9 MG/50ML-% IV SOLN
20.0000 mg | Freq: Two times a day (BID) | INTRAVENOUS | Status: AC
  Administered 2018-07-24 (×2): 20 mg via INTRAVENOUS
  Filled 2018-07-24: qty 50

## 2018-07-24 MED ORDER — MIDAZOLAM HCL 5 MG/5ML IJ SOLN
INTRAMUSCULAR | Status: DC | PRN
  Administered 2018-07-24 (×7): 2 mg via INTRAVENOUS

## 2018-07-24 MED ORDER — PROTAMINE SULFATE 10 MG/ML IV SOLN
INTRAVENOUS | Status: AC
  Filled 2018-07-24: qty 5

## 2018-07-24 MED ORDER — ACETAMINOPHEN 160 MG/5ML PO SOLN: 650.0000 mg | Freq: Once | ORAL | Status: AC

## 2018-07-24 MED ORDER — TRAZODONE HCL 100 MG PO TABS
100.0000 mg | ORAL_TABLET | Freq: Every day | ORAL | Status: DC
  Administered 2018-07-25 – 2018-07-28 (×3): 100 mg via ORAL
  Filled 2018-07-24 (×5): qty 1

## 2018-07-24 MED ORDER — SODIUM CHLORIDE 0.9% FLUSH
10.0000 mL | Freq: Two times a day (BID) | INTRAVENOUS | Status: DC
  Administered 2018-07-24 – 2018-07-26 (×3): 10 mL

## 2018-07-24 MED ORDER — 0.9 % SODIUM CHLORIDE (POUR BTL) OPTIME
TOPICAL | Status: DC | PRN
  Administered 2018-07-24: 1000 mL

## 2018-07-24 MED ORDER — CYCLOBENZAPRINE HCL 10 MG PO TABS
10.0000 mg | ORAL_TABLET | Freq: Every day | ORAL | Status: DC
  Administered 2018-07-25 – 2018-07-28 (×4): 10 mg via ORAL
  Filled 2018-07-24 (×5): qty 1

## 2018-07-24 MED ORDER — ONDANSETRON HCL 4 MG/2ML IJ SOLN
4.0000 mg | Freq: Four times a day (QID) | INTRAMUSCULAR | Status: DC | PRN
  Administered 2018-07-25 (×2): 4 mg via INTRAVENOUS
  Filled 2018-07-24 (×2): qty 2

## 2018-07-24 MED ORDER — SODIUM CHLORIDE 0.9% FLUSH: 3.0000 mL | INTRAVENOUS | Status: DC | PRN

## 2018-07-24 MED ORDER — MAGNESIUM SULFATE 4 GM/100ML IV SOLN
INTRAVENOUS | Status: AC
  Administered 2018-07-24: 4 g via INTRAVENOUS
  Filled 2018-07-24: qty 100

## 2018-07-24 MED ORDER — SODIUM CHLORIDE 0.45 % IV SOLN
INTRAVENOUS | Status: DC | PRN
  Administered 2018-07-24: 13:00:00 via INTRAVENOUS

## 2018-07-24 MED ORDER — POTASSIUM CHLORIDE 10 MEQ/50ML IV SOLN
10.0000 meq | INTRAVENOUS | Status: AC
  Administered 2018-07-24 (×3): 10 meq via INTRAVENOUS
  Filled 2018-07-24: qty 50

## 2018-07-24 MED ORDER — SODIUM CHLORIDE 0.9% FLUSH
3.0000 mL | Freq: Two times a day (BID) | INTRAVENOUS | Status: DC
  Administered 2018-07-25 – 2018-07-26 (×3): 3 mL via INTRAVENOUS

## 2018-07-24 SURGICAL SUPPLY — 115 items
BAG DECANTER FOR FLEXI CONT (MISCELLANEOUS) ×4 IMPLANT
BANDAGE ACE 4X5 VEL STRL LF (GAUZE/BANDAGES/DRESSINGS) ×4 IMPLANT
BANDAGE ACE 6X5 VEL STRL LF (GAUZE/BANDAGES/DRESSINGS) ×4 IMPLANT
BANDAGE ELASTIC 4 VELCRO ST LF (GAUZE/BANDAGES/DRESSINGS) ×4 IMPLANT
BANDAGE ELASTIC 6 VELCRO ST LF (GAUZE/BANDAGES/DRESSINGS) ×4 IMPLANT
BASKET HEART  (ORDER IN 25'S) (MISCELLANEOUS) ×1
BASKET HEART (ORDER IN 25'S) (MISCELLANEOUS) ×1
BASKET HEART (ORDER IN 25S) (MISCELLANEOUS) ×2 IMPLANT
BLADE STERNUM SYSTEM 6 (BLADE) ×4 IMPLANT
BLADE SURG 11 STRL SS (BLADE) ×4 IMPLANT
BNDG GAUZE ELAST 4 BULKY (GAUZE/BANDAGES/DRESSINGS) ×4 IMPLANT
CANISTER SUCT 3000ML PPV (MISCELLANEOUS) ×4 IMPLANT
CATH ROBINSON RED A/P 18FR (CATHETERS) ×8 IMPLANT
CATH THORACIC 28FR (CATHETERS) ×4 IMPLANT
CATH THORACIC 36FR (CATHETERS) ×4 IMPLANT
CATH THORACIC 36FR RT ANG (CATHETERS) ×4 IMPLANT
CLIP FOGARTY SPRING 6M (CLIP) ×4 IMPLANT
CLIP VESOCCLUDE MED 24/CT (CLIP) IMPLANT
CLIP VESOCCLUDE SM WIDE 24/CT (CLIP) ×4 IMPLANT
COVER WAND RF STERILE (DRAPES) ×4 IMPLANT
CRADLE DONUT ADULT HEAD (MISCELLANEOUS) ×4 IMPLANT
DERMABOND ADHESIVE PROPEN (GAUZE/BANDAGES/DRESSINGS) ×2
DERMABOND ADVANCED .7 DNX6 (GAUZE/BANDAGES/DRESSINGS) ×2 IMPLANT
DRAPE CARDIOVASCULAR INCISE (DRAPES) ×2
DRAPE INCISE IOBAN 66X45 STRL (DRAPES) ×4 IMPLANT
DRAPE SLUSH/WARMER DISC (DRAPES) ×4 IMPLANT
DRAPE SRG 135X102X78XABS (DRAPES) ×2 IMPLANT
DRSG COVADERM 4X14 (GAUZE/BANDAGES/DRESSINGS) ×4 IMPLANT
ELECT CAUTERY BLADE 6.4 (BLADE) ×4 IMPLANT
ELECT REM PT RETURN 9FT ADLT (ELECTROSURGICAL) ×8
ELECTRODE REM PT RTRN 9FT ADLT (ELECTROSURGICAL) ×4 IMPLANT
FELT TEFLON 1X6 (MISCELLANEOUS) ×4 IMPLANT
GAUZE SPONGE 4X4 12PLY STRL (GAUZE/BANDAGES/DRESSINGS) ×8 IMPLANT
GAUZE SPONGE 4X4 12PLY STRL LF (GAUZE/BANDAGES/DRESSINGS) ×8 IMPLANT
GLOVE BIO SURGEON STRL SZ 6 (GLOVE) IMPLANT
GLOVE BIO SURGEON STRL SZ 6.5 (GLOVE) ×24 IMPLANT
GLOVE BIO SURGEON STRL SZ7 (GLOVE) IMPLANT
GLOVE BIO SURGEON STRL SZ7.5 (GLOVE) IMPLANT
GLOVE BIO SURGEONS STRL SZ 6.5 (GLOVE) ×8
GLOVE BIOGEL M 6.5 STRL (GLOVE) ×16 IMPLANT
GLOVE BIOGEL PI IND STRL 6 (GLOVE) IMPLANT
GLOVE BIOGEL PI IND STRL 6.5 (GLOVE) ×4 IMPLANT
GLOVE BIOGEL PI IND STRL 7.0 (GLOVE) ×4 IMPLANT
GLOVE BIOGEL PI INDICATOR 6 (GLOVE)
GLOVE BIOGEL PI INDICATOR 6.5 (GLOVE) ×4
GLOVE BIOGEL PI INDICATOR 7.0 (GLOVE) ×4
GLOVE EUDERMIC 7 POWDERFREE (GLOVE) ×8 IMPLANT
GLOVE ORTHO TXT STRL SZ7.5 (GLOVE) IMPLANT
GLOVE SS BIOGEL STRL SZ 8 (GLOVE) ×4 IMPLANT
GLOVE SS BIOGEL STRL SZ 8.5 (GLOVE) ×4 IMPLANT
GLOVE SUPERSENSE BIOGEL SZ 8 (GLOVE) ×4
GLOVE SUPERSENSE BIOGEL SZ 8.5 (GLOVE) ×4
GOWN STRL REUS W/ TWL LRG LVL3 (GOWN DISPOSABLE) ×10 IMPLANT
GOWN STRL REUS W/ TWL XL LVL3 (GOWN DISPOSABLE) ×2 IMPLANT
GOWN STRL REUS W/TWL 2XL LVL3 (GOWN DISPOSABLE) ×12 IMPLANT
GOWN STRL REUS W/TWL LRG LVL3 (GOWN DISPOSABLE) ×10
GOWN STRL REUS W/TWL XL LVL3 (GOWN DISPOSABLE) ×2
HEMOSTAT POWDER SURGIFOAM 1G (HEMOSTASIS) ×12 IMPLANT
HEMOSTAT SURGICEL 2X14 (HEMOSTASIS) ×4 IMPLANT
INSERT FOGARTY 61MM (MISCELLANEOUS) IMPLANT
INSERT FOGARTY XLG (MISCELLANEOUS) IMPLANT
KIT BASIN OR (CUSTOM PROCEDURE TRAY) ×4 IMPLANT
KIT CATH CPB BARTLE (MISCELLANEOUS) ×4 IMPLANT
KIT SUCTION CATH 14FR (SUCTIONS) ×4 IMPLANT
KIT TURNOVER KIT B (KITS) ×4 IMPLANT
KIT VASOVIEW HEMOPRO 2 VH 4000 (KITS) ×4 IMPLANT
NS IRRIG 1000ML POUR BTL (IV SOLUTION) ×20 IMPLANT
PACK E OPEN HEART (SUTURE) ×4 IMPLANT
PACK OPEN HEART (CUSTOM PROCEDURE TRAY) ×4 IMPLANT
PAD ARMBOARD 7.5X6 YLW CONV (MISCELLANEOUS) ×8 IMPLANT
PAD ELECT DEFIB RADIOL ZOLL (MISCELLANEOUS) ×4 IMPLANT
PENCIL BUTTON HOLSTER BLD 10FT (ELECTRODE) ×4 IMPLANT
PUNCH AORTIC ROTATE 4.0MM (MISCELLANEOUS) IMPLANT
PUNCH AORTIC ROTATE 4.5MM 8IN (MISCELLANEOUS) ×4 IMPLANT
PUNCH AORTIC ROTATE 5MM 8IN (MISCELLANEOUS) IMPLANT
SET CARDIOPLEGIA MPS 5001102 (MISCELLANEOUS) ×4 IMPLANT
SOLUTION ANTI FOG 6CC (MISCELLANEOUS) ×4 IMPLANT
SPONGE INTESTINAL PEANUT (DISPOSABLE) IMPLANT
SPONGE LAP 18X18 RF (DISPOSABLE) ×4 IMPLANT
SPONGE LAP 4X18 RFD (DISPOSABLE) IMPLANT
SUT BONE WAX W31G (SUTURE) ×4 IMPLANT
SUT MNCRL AB 4-0 PS2 18 (SUTURE) ×4 IMPLANT
SUT PROLENE 3 0 SH DA (SUTURE) IMPLANT
SUT PROLENE 3 0 SH1 36 (SUTURE) ×4 IMPLANT
SUT PROLENE 4 0 RB 1 (SUTURE)
SUT PROLENE 4 0 SH DA (SUTURE) IMPLANT
SUT PROLENE 4-0 RB1 .5 CRCL 36 (SUTURE) IMPLANT
SUT PROLENE 5 0 C 1 36 (SUTURE) IMPLANT
SUT PROLENE 6 0 C 1 30 (SUTURE) ×8 IMPLANT
SUT PROLENE 7 0 BV 1 (SUTURE) IMPLANT
SUT PROLENE 7 0 BV1 MDA (SUTURE) ×4 IMPLANT
SUT PROLENE 8 0 BV175 6 (SUTURE) ×4 IMPLANT
SUT PROLENE BLUE 7 0 (SUTURE) ×4 IMPLANT
SUT SILK  1 MH (SUTURE)
SUT SILK 1 MH (SUTURE) IMPLANT
SUT STEEL STERNAL CCS#1 18IN (SUTURE) IMPLANT
SUT STEEL SZ 6 DBL 3X14 BALL (SUTURE) ×12 IMPLANT
SUT VIC AB 1 CTX 36 (SUTURE) ×4
SUT VIC AB 1 CTX36XBRD ANBCTR (SUTURE) ×4 IMPLANT
SUT VIC AB 2-0 CT1 27 (SUTURE) ×2
SUT VIC AB 2-0 CT1 TAPERPNT 27 (SUTURE) ×2 IMPLANT
SUT VIC AB 2-0 CTX 27 (SUTURE) IMPLANT
SUT VIC AB 3-0 SH 27 (SUTURE)
SUT VIC AB 3-0 SH 27X BRD (SUTURE) IMPLANT
SUT VIC AB 3-0 X1 27 (SUTURE) IMPLANT
SUT VICRYL 4-0 PS2 18IN ABS (SUTURE) IMPLANT
SYSTEM SAHARA CHEST DRAIN ATS (WOUND CARE) ×4 IMPLANT
TAPE CLOTH SURG 4X10 WHT LF (GAUZE/BANDAGES/DRESSINGS) ×4 IMPLANT
TAPE PAPER 3X10 WHT MICROPORE (GAUZE/BANDAGES/DRESSINGS) ×4 IMPLANT
TOWEL GREEN STERILE (TOWEL DISPOSABLE) ×4 IMPLANT
TOWEL GREEN STERILE FF (TOWEL DISPOSABLE) ×4 IMPLANT
TRAY FOLEY SLVR 16FR TEMP STAT (SET/KITS/TRAYS/PACK) ×4 IMPLANT
TUBING INSUFFLATION (TUBING) ×4 IMPLANT
UNDERPAD 30X30 (UNDERPADS AND DIAPERS) ×4 IMPLANT
WATER STERILE IRR 1000ML POUR (IV SOLUTION) ×8 IMPLANT

## 2018-07-24 NOTE — Transfer of Care (Signed)
Immediate Anesthesia Transfer of Care Note  Patient: CHASEN MENDELL  Procedure(s) Performed: CORONARY ARTERY BYPASS GRAFTING (CABG) (N/A Chest) TRANSESOPHAGEAL ECHOCARDIOGRAM (TEE) (N/A )  Patient Location: SICU  Anesthesia Type:General  Level of Consciousness: sedated, unresponsive and Patient remains intubated per anesthesia plan  Airway & Oxygen Therapy: Patient remains intubated per anesthesia plan and Patient placed on Ventilator (see vital sign flow sheet for setting)  Post-op Assessment: Report given to RN and Post -op Vital signs reviewed and stable  Post vital signs: Reviewed and stable  Last Vitals:  Vitals Value Taken Time  BP 126/72 07/24/2018  1:06 PM  Temp 35.5 C 07/24/2018  1:16 PM  Pulse 90 07/24/2018  1:16 PM  Resp 12 07/24/2018  1:16 PM  SpO2 100 % 07/24/2018  1:16 PM  Vitals shown include unvalidated device data.  Last Pain:  Vitals:   07/24/18 0604  TempSrc:   PainSc: 0-No pain      Patients Stated Pain Goal: 3 (88/89/16 9450)  Complications: No apparent anesthesia complications

## 2018-07-24 NOTE — Anesthesia Procedure Notes (Signed)
Central Venous Catheter Insertion Performed by: Audry Pili, MD, anesthesiologist Start/End1/21/2020 7:03 AM, 07/24/2018 7:06 AM Patient location: Pre-op. Preanesthetic checklist: patient identified, IV checked, risks and benefits discussed, surgical consent, monitors and equipment checked, pre-op evaluation, timeout performed and anesthesia consent Position: Trendelenburg Hand hygiene performed  and maximum sterile barriers used  Total catheter length 10. PA cath was placed.Swan type:thermodilution PA Cath depth:45 Procedure performed without using ultrasound guided technique. Attempts: 1 Patient tolerated the procedure well with no immediate complications.

## 2018-07-24 NOTE — Progress Notes (Signed)
Initiated Open Heart Rapid Wean per Protocol 

## 2018-07-24 NOTE — Op Note (Signed)
CARDIOVASCULAR SURGERY OPERATIVE NOTE  07/24/2018  Surgeon:  Gaye Pollack, MD  First Assistant: Ellwood Handler,  PA-C   Preoperative Diagnosis:  Severe multi-vessel coronary artery disease   Postoperative Diagnosis:  Same   Procedure:  1. Median Sternotomy 2. Extracorporeal circulation 3.   Coronary artery bypass grafting x 5   Left internal mammary artery graft to the LAD  SVG to diagonal  SVG to PDA  Sequential SVG to Ramus and OM 4.   Endoscopic vein harvest from the right leg   Anesthesia:  General Endotracheal   Clinical History/Surgical Indication:  This 63 year old gentleman has severe three-vessel coronary disease status post multiple stenting procedures in the past. He has pain in his left arm with numbness and tingling as well as exertional fatigue and shortness of breath of several months duration. His nuclear stress test does show some inferior ischemia. He has 95% hazy stenoses in the ramus and obtuse marginal branch as well as 75% LAD stenosis in 2 locations. I think is probably best to proceed with coronary bypass graft surgery for relief of his symptoms and to prevent further ischemia and infarction. It is not completely clear that his left arm symptoms are due to his heart and could certainly be due to some cervical spine disease. Since this is only been going on for several months and about the same time as his fatigue and shortness of breath I think it is more likely to be due to to coronary ischemia.  I discussed the operative procedure with the patient andhis wifeincluding alternatives, benefits and risks; including but not limited to bleeding, blood transfusion, infection, stroke, myocardial infarction, graft failure, heart block requiring a permanent pacemaker, organ dysfunction, and death. Roy [name] and agrees to proceed.    Preparation:  The patient was seen in the preoperative holding area and the correct patient, correct operation were confirmed with the patient after reviewing the medical record and catheterization. The consent was signed by me. Preoperative antibiotics were given. A pulmonary arterial line and radial arterial line were placed by the anesthesia team. The patient was taken back to the operating room and positioned supine on the operating room table. After being placed under general endotracheal anesthesia by the anesthesia team a foley catheter was placed. The neck, chest, abdomen, and both legs were prepped with betadine soap and solution and draped in the usual sterile manner. A surgical time-out was taken and the correct patient and operative procedure were confirmed with the nursing and anesthesia staff.   Cardiopulmonary Bypass:  A median sternotomy was performed. The pericardium was opened in the midline. Right ventricular function appeared normal. The ascending aorta was of normal size and had no palpable plaque. There were no contraindications to aortic cannulation or cross-clamping. The patient was fully systemically heparinized and the ACT was maintained > 400 sec. The proximal aortic arch was cannulated with a 20 F aortic cannula for arterial inflow. Venous cannulation was performed via the right atrial appendage using a two-staged venous cannula. An antegrade cardioplegia/vent cannula was inserted into the mid-ascending aorta. Aortic occlusion was performed with a single cross-clamp. Systemic cooling to 32 degrees Centigrade and topical cooling of the heart with iced saline were used. Hyperkalemic antegrade cold blood cardioplegia was used to induce diastolic arrest and was then given at about 20 minute intervals throughout the period of arrest to maintain myocardial temperature at or below 10 degrees centigrade. A temperature probe was inserted into the interventricular septum and an  insulating pad was placed in the pericardium.   Left internal mammary harvest:  The left side of the sternum was retracted using the Rultract retractor. The left internal mammary artery was harvested as a pedicle graft. All side branches were clipped. It was a medium-sized vessel of good quality with excellent blood flow. It was ligated distally and divided. It was sprayed with topical papaverine solution to prevent vasospasm.   Endoscopic vein harvest:  The right greater saphenous vein was harvested endoscopically through a 2 cm incision medial to the right knee. It was harvested from the upper thigh to below the knee. It was a medium-sized vein of good quality. The side branches were all ligated with 4-0 silk ties.    Coronary arteries:  The coronary arteries were examined.   LAD:  Large vessel with diffuse disease. Diagonal was a moderate sized vessel with no distal disease.  LCX:  Moderate sized Ramus and OM branches both of which were intramyocardial.  RCA:  Moderate sized PDA that was lying beneath a large PD vein. The only place I could visualized the vessel was at the origin from the RCA where where there was no disease.   Grafts:  1. LIMA to the LAD: 2.0 mm. It was sewn end to side using 8-0 prolene continuous suture. 2. SVG to diagonal:  1.75 mm. It was sewn end to side using 7-0 prolene continuous suture. 3. SVG to PDA:  1.75 mm. It was sewn end to side using 7-0 prolene continuous suture. 4. Sequential SVG to Ramus:  1.6 mm. It was sewn sequential side to side using 7-0 prolene continuous suture. 5.   Sequential SVG to OM:  1.6 mm. It was sewn sequential end to side using 7-0 prolene continuous suture.  The proximal vein graft anastomoses were performed to the mid-ascending aorta using continuous 6-0 prolene suture. Graft markers were placed around the proximal anastomoses.   Completion:  The patient was rewarmed to 37 degrees Centigrade. The clamp was removed from  the LIMA pedicle and there was rapid warming of the septum and return of ventricular fibrillation. The crossclamp was removed with a time of 99 minutes. There was spontaneous return of sinus rhythm. The distal and proximal anastomoses were checked for hemostasis. The position of the grafts was satisfactory. Two temporary epicardial pacing wires were placed on the right atrium and two on the right ventricle. The patient was weaned from CPB without difficulty on no inotropes. CPB time was 118 minutes. Cardiac output was 5 LPM. TEE showed normal LV systolic function. Heparin was fully reversed with protamine and the aortic and venous cannulas removed. Hemostasis was achieved. Mediastinal and left pleural drainage tubes were placed. The sternum was closed with double #6 stainless steel wires. The fascia was closed with continuous # 1 vicryl suture. The subcutaneous tissue was closed with 2-0 vicryl continuous suture. The skin was closed with 3-0 vicryl subcuticular suture. All sponge, needle, and instrument counts were reported correct at the end of the case. Dry sterile dressings were placed over the incisions and around the chest tubes which were connected to pleurevac suction. The patient was then transported to the surgical intensive care unit in stable condition.

## 2018-07-24 NOTE — Procedures (Signed)
Extubation Procedure Note  Patient Details:   Name: RIDER ERMIS DOB: 01/27/1956 MRN: [id number]   Airway Documentation:    Vent end date: 07/24/18 Vent end time: 1726   Evaluation  O2 sats: stable throughout Complications: No apparent complications Patient did tolerate procedure well. Bilateral Breath Sounds: Expiratory wheezes, Diminished   Yes   Pt extubated to 3L [place] per Open Heart Rapid Wean Protocol. NIF -30, VC 0.850L, ABG within normal limits. Positive cuff leak noted prior to extubation. Pt able to speak and has a strong, non-productive cough and was encouraged to use Yankauer to clear secretions. No distress noted, no increased WOB, VS within normal limits. RT will continue to monitor   Jesse Sans 07/24/2018, 5:33 PM

## 2018-07-24 NOTE — Progress Notes (Signed)
Dr. Cyndia Bent made aware of patient's PAP being low 10-15/3-7. CI 1.7-1.9. Stated he would look at it when he rounded on the unit. Stated it's ok to continue to wean and extubate patient.  Joellen [place], RN

## 2018-07-24 NOTE — Brief Op Note (Signed)
07/24/2018  9:02 AM  PATIENT:  Roy [name]  63 y.o. male  PRE-OPERATIVE DIAGNOSIS:  CAD  POST-OPERATIVE DIAGNOSIS:  CAD  PROCEDURE:  Procedure(s):  CORONARY ARTERY BYPASS GRAFTING x 4 -LIMA to LAD -SVG to PDA -SEQ SVG to OM and RAMUS INTERMEDIATE  ENDOSCOPIC HARVEST GREATER SAPHENOUS -Right Leg  TRANSESOPHAGEAL ECHOCARDIOGRAM (TEE) (N/A)  SURGEON:  Surgeon(s) and Role:    * Bartle, Fernande Boyden, MD - Primary  PHYSICIAN ASSISTANT: Maybelline Kolarik PA-C  ANESTHESIA:   general  EBL:  700 mL   BLOOD ADMINISTERED: CELLSAVER  DRAINS: Left Pleural Chest Tubes, Mediastinal Chest Drains   LOCAL MEDICATIONS USED:  NONE  SPECIMEN:  No Specimen  DISPOSITION OF SPECIMEN:  N/A  COUNTS:  YES  TOURNIQUET:  * No tourniquets in log *  DICTATION: .Dragon Dictation  PLAN OF CARE: Admit to inpatient   PATIENT DISPOSITION:  ICU - intubated and hemodynamically stable.   Delay start of Pharmacological VTE agent (>24hrs) due to surgical blood loss or risk of bleeding: yes

## 2018-07-24 NOTE — Anesthesia Procedure Notes (Addendum)
Procedure Name: Intubation Date/Time: 07/24/2018 7:48 AM Performed by: Renato Shin, CRNA Pre-anesthesia Checklist: Patient identified, Emergency Drugs available, Suction available, Patient being monitored and Timeout performed Patient Re-evaluated:Patient Re-evaluated prior to induction Oxygen Delivery Method: Circle system utilized Preoxygenation: Pre-oxygenation with 100% oxygen Induction Type: IV induction Ventilation: Mask ventilation without difficulty Laryngoscope Size: Mac and 3 Grade View: Grade I Tube type: Oral Tube size: 8.0 mm Number of attempts: 1 Placement Confirmation: ETT inserted through vocal cords under direct vision and positive ETCO2 Secured at: 23 cm Tube secured with: Tape Dental Injury: Teeth and Oropharynx as per pre-operative assessment  Comments: Intubation: Yevonne Aline, SRNA

## 2018-07-24 NOTE — Anesthesia Procedure Notes (Signed)
Arterial Line Insertion Start/End1/21/2020 6:56 AM, 07/24/2018 6:56 AM Performed by: CRNA  Patient location: Pre-op. Preanesthetic checklist: patient identified, IV checked, site marked, risks and benefits discussed, surgical consent, monitors and equipment checked, pre-op evaluation, timeout performed and anesthesia consent Patient sedated Left, radial was placed Catheter size: 20 G Hand hygiene performed , maximum sterile barriers used  and Seldinger technique used Allen's test indicative of satisfactory collateral circulation Attempts: 1 Procedure performed without using ultrasound guided technique. Following insertion, dressing applied and Biopatch. Post procedure assessment: normal  Patient tolerated the procedure well with no immediate complications. Additional procedure comments: Yevonne Aline, [place].

## 2018-07-24 NOTE — Anesthesia Procedure Notes (Addendum)
Central Venous Catheter Insertion Performed by: Audry Pili, MD, anesthesiologist Start/End1/21/2020 6:55 AM, 07/24/2018 7:03 AM Preanesthetic checklist: patient identified, IV checked, risks and benefits discussed, surgical consent, monitors and equipment checked, pre-op evaluation, timeout performed and anesthesia consent Position: Trendelenburg Lidocaine 1% used for infiltration and patient sedated Hand hygiene performed , maximum sterile barriers used  and Seldinger technique used Catheter size: 8.5 Fr Central line was placed.Sheath introducer Procedure performed using ultrasound guided technique. Ultrasound Notes:anatomy identified, needle tip was noted to be adjacent to the nerve/plexus identified, no ultrasound evidence of intravascular and/or intraneural injection and image(s) printed for medical record Attempts: 1 Following insertion, line sutured, dressing applied and Biopatch. Post procedure assessment: blood return through all ports, free fluid flow and no air  Patient tolerated the procedure well with no immediate complications.

## 2018-07-24 NOTE — Interval H&P Note (Signed)
History and Physical Interval Note:  07/24/2018 7:05 AM  Roy [name]  has presented today for surgery, with the diagnosis of CAD  The various methods of treatment have been discussed with the patient and family. After consideration of risks, benefits and other options for treatment, the patient has consented to  Procedure(s): CORONARY ARTERY BYPASS GRAFTING (CABG) (N/A) TRANSESOPHAGEAL ECHOCARDIOGRAM (TEE) (N/A) as a surgical intervention .  The patient's history has been reviewed, patient examined, no change in status, stable for surgery.  I have reviewed the patient's chart and labs.  Questions were answered to the patient's satisfaction.     Gaye Pollack

## 2018-07-24 NOTE — Progress Notes (Signed)
[facility] BRIEF SICU PROGRESS NOTE  Day of Surgery  S/P Procedure(s) (LRB): CORONARY ARTERY BYPASS GRAFTING (CABG) (N/A) TRANSESOPHAGEAL ECHOCARDIOGRAM (TEE) (N/A)   Waking up on vent AAI paced w/ stable hemodynamics Chest tube output low UOP excellent Labs okay  Plan: Continue routine early postop  Rexene Alberts, MD 07/24/2018 4:50 PM

## 2018-07-24 NOTE — Progress Notes (Signed)
  Echocardiogram Echocardiogram Transesophageal has been performed.  Darlina Sicilian M 07/24/2018, 7:57 AM

## 2018-07-24 NOTE — Progress Notes (Signed)
Patient stated that he had to take 1 SL nitroglycerin tablet yesterday for "chest pain". Pt states that pain resolved after one tablet. Pt has not had an episode since then. Pt presents to surgery today for CABG procedure.   Jacqlyn Larsen, RN

## 2018-07-24 NOTE — Progress Notes (Signed)
Pt took 6.25 mg of carvedilol this morning at 0430. HR currently at 50 bpm. Pt is alert, oriented and pleasant. No cardiac symptoms at this time.

## 2018-07-25 ENCOUNTER — Inpatient Hospital Stay (HOSPITAL_COMMUNITY): Payer: Medicare HMO

## 2018-07-25 ENCOUNTER — Encounter (HOSPITAL_COMMUNITY): Payer: Self-pay | Admitting: Surgery

## 2018-07-25 LAB — BASIC METABOLIC PANEL
Anion gap: 8 (ref 5–15)
Anion gap: 9 (ref 5–15)
BUN: 10 mg/dL (ref 8–23)
BUN: 10 mg/dL (ref 8–23)
CO2: 23 mmol/L (ref 22–32)
CO2: 24 mmol/L (ref 22–32)
CREATININE: 0.9 mg/dL (ref 0.61–1.24)
Calcium: 8.2 mg/dL — ABNORMAL LOW (ref 8.9–10.3)
Calcium: 8.7 mg/dL — ABNORMAL LOW (ref 8.9–10.3)
Chloride: 106 mmol/L (ref 98–111)
Chloride: 104 mmol/L (ref 98–111)
Creatinine, Ser: 0.93 mg/dL (ref 0.61–1.24)
GFR calc Af Amer: >60 mL/min (ref >60)
GFR calc Af Amer: >60 mL/min (ref >60)
GFR calc non Af Amer: >60 mL/min (ref >60)
GFR calc non Af Amer: >60 mL/min (ref >60)
Glucose, Bld: 118 mg/dL — ABNORMAL HIGH (ref 70–99)
Glucose, Bld: 139 mg/dL — ABNORMAL HIGH (ref 70–99)
Potassium: 4.7 mmol/L (ref 3.5–5.1)
Potassium: 4.3 mmol/L (ref 3.5–5.1)
Sodium: 137 mmol/L (ref 135–145)
Sodium: 137 mmol/L (ref 135–145)

## 2018-07-25 LAB — CBC
HCT: 24.8 % — ABNORMAL LOW (ref 39.0–52.0)
HCT: 25.3 % — ABNORMAL LOW (ref 39.0–52.0)
Hemoglobin: 8.2 g/dL — ABNORMAL LOW (ref 13.0–17.0)
Hemoglobin: 8.3 g/dL — ABNORMAL LOW (ref 13.0–17.0)
MCH: 29.9 pg (ref 26.0–34.0)
MCH: 30.5 pg (ref 26.0–34.0)
MCHC: 33.1 g/dL (ref 30.0–36.0)
MCHC: 32.8 g/dL (ref 30.0–36.0)
MCV: 90.5 fL (ref 80.0–100.0)
MCV: 93 fL (ref 80.0–100.0)
NRBC: 0 % (ref 0.0–0.2)
Platelets: 144 10*3/uL — ABNORMAL LOW (ref 150–400)
Platelets: 177 10*3/uL (ref 150–400)
RBC: 2.74 MIL/uL — ABNORMAL LOW (ref 4.22–5.81)
RBC: 2.72 MIL/uL — ABNORMAL LOW (ref 4.22–5.81)
RDW: 14.3 % (ref 11.5–15.5)
RDW: 14.9 % (ref 11.5–15.5)
WBC: 12.9 10*3/uL — ABNORMAL HIGH (ref 4.0–10.5)
WBC: 19.7 10*3/uL — ABNORMAL HIGH (ref 4.0–10.5)
nRBC: 0 % (ref 0.0–0.2)

## 2018-07-25 LAB — GLUCOSE, CAPILLARY
Glucose-Capillary: 115 mg/dL — ABNORMAL HIGH (ref 70–99)
Glucose-Capillary: 121 mg/dL — ABNORMAL HIGH (ref 70–99)
Glucose-Capillary: 118 mg/dL — ABNORMAL HIGH (ref 70–99)
Glucose-Capillary: 109 mg/dL — ABNORMAL HIGH (ref 70–99)
Glucose-Capillary: 112 mg/dL — ABNORMAL HIGH (ref 70–99)
Glucose-Capillary: 109 mg/dL — ABNORMAL HIGH (ref 70–99)
Glucose-Capillary: 97 mg/dL (ref 70–99)
Glucose-Capillary: 131 mg/dL — ABNORMAL HIGH (ref 70–99)
Glucose-Capillary: 129 mg/dL — ABNORMAL HIGH (ref 70–99)
Glucose-Capillary: 128 mg/dL — ABNORMAL HIGH (ref 70–99)
Glucose-Capillary: 147 mg/dL — ABNORMAL HIGH (ref 70–99)
Glucose-Capillary: 165 mg/dL — ABNORMAL HIGH (ref 70–99)
Glucose-Capillary: 101 mg/dL — ABNORMAL HIGH (ref 70–99)
Glucose-Capillary: 123 mg/dL — ABNORMAL HIGH (ref 70–99)
Glucose-Capillary: 124 mg/dL — ABNORMAL HIGH (ref 70–99)

## 2018-07-25 LAB — MAGNESIUM
Magnesium: 2.6 mg/dL — ABNORMAL HIGH (ref 1.7–2.4)
Magnesium: 2.4 mg/dL (ref 1.7–2.4)

## 2018-07-25 MED ORDER — INSULIN DETEMIR 100 UNIT/ML ~~LOC~~ SOLN
15.0000 [IU] | Freq: Once | SUBCUTANEOUS | Status: AC
  Administered 2018-07-25: 15 [IU] via SUBCUTANEOUS
  Filled 2018-07-25: qty 0.15

## 2018-07-25 MED ORDER — SODIUM CHLORIDE 0.9 % IV SOLN
INTRAVENOUS | Status: DC | PRN
  Administered 2018-07-25: 250 mL via INTRAVENOUS

## 2018-07-25 MED ORDER — INSULIN DETEMIR 100 UNIT/ML ~~LOC~~ SOLN
15.0000 [IU] | Freq: Once | SUBCUTANEOUS | Status: DC
  Filled 2018-07-25: qty 0.15

## 2018-07-25 MED ORDER — INSULIN ASPART 100 UNIT/ML ~~LOC~~ SOLN
0.0000 [IU] | Freq: Three times a day (TID) | SUBCUTANEOUS | Status: DC
  Administered 2018-07-25: 2 [IU] via SUBCUTANEOUS

## 2018-07-25 MED ORDER — ENOXAPARIN SODIUM 40 MG/0.4ML ~~LOC~~ SOLN
40.0000 mg | Freq: Every day | SUBCUTANEOUS | Status: DC
  Administered 2018-07-25: 40 mg via SUBCUTANEOUS
  Filled 2018-07-25: qty 0.4

## 2018-07-25 MED FILL — Thrombin For Soln 5000 Unit: CUTANEOUS | Qty: 5000 | Status: AC

## 2018-07-25 MED FILL — Gelatin Absorbable MT Powder: OROMUCOSAL | Qty: 1 | Status: AC

## 2018-07-25 NOTE — Plan of Care (Signed)
  Problem: Cardiac: Goal: Will achieve and/or maintain hemodynamic stability Outcome: Progressing   Problem: Clinical Measurements: Goal: Postoperative complications will be avoided or minimized Outcome: Progressing   Problem: Respiratory: Goal: Respiratory status will improve Outcome: Progressing   Problem: Urinary Elimination: Goal: Ability to achieve and maintain adequate renal perfusion and functioning will improve Outcome: Progressing   

## 2018-07-25 NOTE — Discharge Summary (Signed)
Physician Discharge Summary  Patient ID: Roy [name] MRN: [id number] DOB/AGE: Nov 05, 1955 63 y.o.  Admit date: 07/24/2018 Discharge date: 07/29/2018  Admission Diagnoses:  Patient Active Problem List   Diagnosis Date Noted  . Abnormal nuclear stress test   . Rectal bleeding 03/27/2017  . Angina, class III ([place])   . Unspecified hypothyroidism 11/18/2013  . Personal history of colonic polyps 11/13/2012  . Hepatitis   . ANXIETY DEPRESSION 12/29/2009  . Hyperlipidemia, mixed 03/26/2009  . HTN (hypertension) 03/26/2009  . CAD, NATIVE VESSEL 03/26/2009  . BRADYCARDIA 03/26/2009   Discharge Diagnoses:   Patient Active Problem List   Diagnosis Date Noted  . S/P CABG x 4 07/24/2018  . Abnormal nuclear stress test   . Rectal bleeding 03/27/2017  . Angina, class III ([place])   . Unspecified hypothyroidism 11/18/2013  . Personal history of colonic polyps 11/13/2012  . Hepatitis   . ANXIETY DEPRESSION 12/29/2009  . Hyperlipidemia, mixed 03/26/2009  . HTN (hypertension) 03/26/2009  . CAD, NATIVE VESSEL 03/26/2009  . BRADYCARDIA 03/26/2009   Discharged Condition: good  History of Present Illness:  Roy [name] is a 63 yo white male with known history of HTN, Hyperlipidemia, H/O MI with PCI with bare metal stent to the OM, RCA, and LAD.  He also required cutting balloon angioplasty due to in-stent stenosis of the LAD.  He has been on ASA and Plavix.  He presented with complaints of several months of left arm pain associated with numbness and tingling in his hand.  The patient also developed exertional fatigue and shortness of breath.  He sought evaluation with an orthopedic surgeon who treated the patient with steroids and his left arm pain improved.  Nuclear stress test was performed on 05/24/2018 which showed no ST segment changes with stress.  EF was 55-65%.  The study was felt to be intermediate risk and cardiac catheterization was recommended.  This was performed on 07/12/2018 and showed  multivessel CAD.  It was felt coronary bypass grafting would be indicated and TCTS referral was obtained.  The patient was evaluated by Dr. Cyndia Bent who was in agreement the patient would benefit from coronary bypass procedure.  The risks and benefits of the procedure were explained to the patient and he was agreeable to proceed.  Hospital Course:   [name] presented to [facility] on 07/24/2018.  He underwent CABG x 5 utilizing LIMA to LAD, SVG to Diagonal, SVG to PDA, and Sequential SVG to Ramus and OM.  He also underwent endoscopic harvest of greater saphenous vein from his right leg.  He tolerated the procedure without difficulty and was taken to the SICU in stable condition.  He was extubated the evening of surgery.  During his stay in the SICU the patient's chest tubes and arterial lines were removed without difficulty.  He was started on diuretics for hypervolemia. He was treated with a dose of Solumedrol for a post operative rash.  He developed post operative blood loss anemia.  He was transfused packed cells.  His hemoglobin improved to 9.3 and he will continue iron supplementation.  He remains in NSR.  He is ambulating independently.  He was medically stable for transfer to the telemetry unit on 07/28/2018.  He continues to do well.  His pacing wires were removed without difficulty.  He had some Sinus Tachycardia and his Coreg dose was increased.  He is ambulating independently.  His incisions are healing without evidence of infection.  He will not be given pain  medication at discharge as he received 120 Percocet on 07/17/2018.  He is medically stable for discharge home today.  Significant Diagnostic Studies: angiography:    Previously placed Prox RCA to Mid RCA stent (unknown type) is widely patent.  Mid LM to Dist LM lesion is 40% stenosed.  Mid RCA lesion is 10% stenosed.  Ost Ramus to Ramus lesion is 95% stenosed.  Ost 1st Mrg to 1st Mrg lesion is 95% stenosed.  Ost LAD lesion  is 50% stenosed.  Previously placed Prox LAD stent (unknown type) is widely patent.  Ost 1st Diag to 1st Diag lesion is 99% stenosed.  Ost 2nd Diag to 2nd Diag lesion is 70% stenosed.  Mid LAD-1 lesion is 75% stenosed.  Previously placed Mid LAD-2 stent (unknown type) is widely patent.  Dist RCA lesion is 25% stenosed.  Dist Cx lesion is 90% stenosed.  The left ventricular systolic function is normal.  LV end diastolic pressure is normal.  The left ventricular ejection fraction is 55-65% by visual estimate.  There is no aortic valve stenosis.   Severe multivessel CAD.  Multiple branches involved and moderate distal left main and ostial LAD disease as well.  Severe mid LAD disease.  CTO of large diagonal branch.   Treatments: surgery:   1. Median Sternotomy 2. Extracorporeal circulation 3.   Coronary artery bypass grafting x 5   Left internal mammary artery graft to the LAD  SVG to diagonal  SVG to PDA  Sequential SVG to Ramus and OM 4.   Endoscopic vein harvest from the right leg  Discharge Exam: Blood pressure 112/79, pulse (!) 105, temperature 98.3 F (36.8 C), temperature source Oral, resp. rate (!) 22, height 5\' 9"  (1.753 m), weight 77.6 kg, SpO2 95 %.  General appearance: alert, cooperative and no distress Heart: regular rate and rhythm Lungs: clear to auscultation bilaterally Abdomen: soft, non-tender; bowel sounds normal; no masses,  no organomegaly Extremities: edema trace Wound: clean and dry  Disposition:  Home  Discharge Medications:   The patient has been discharged on:   1.Beta Blocker:  Yes [ X  ]                              No   [   ]                              If No, reason:  2.Ace Inhibitor/ARB: Yes [   ]                                     No  [ x   ]                                     If No, reason: labile BP  3.Statin:   Yes [ X  ]                  No  [   ]                  If No, reason:  4.Ecasa:  Yes  [ X  ]  No   [   ]                  If No, reason:     Discharge Instructions    Amb Referral to Cardiac Rehabilitation   Complete by:  As directed    Diagnosis:  CABG   CABG X ___:  4     Allergies as of 07/29/2018      Reactions   Penicillins Other (See Comments)   Chest pain PATIENT HAS HAD A PCN REACTION WITH IMMEDIATE RASH, FACIAL/TONGUE/THROAT SWELLING, SOB, OR LIGHTHEADEDNESS WITH HYPOTENSION:  #  #  YES  #  #  Has patient had a PCN reaction causing severe rash involving mucus membranes or skin necrosis: no Has patient had a PCN reaction that required hospitalization: no Has patient had a PCN reaction occurring within the last 10 years: yes If all of the above answers are "NO", then may proceed with Cephalosporin use.   Imdur [isosorbide Nitrate] Other (See Comments)   Has not tolerated well in the past UNSPECIFIED REACTION    Codeine Itching   Food Rash, Other (See Comments)   PEACHES      Medication List    STOP taking these medications   amLODipine 10 MG tablet Commonly known as:  NORVASC   aspirin 81 MG tablet Replaced by:  aspirin 325 MG EC tablet   lisinopril 5 MG tablet Commonly known as:  PRINIVIL,ZESTRIL     TAKE these medications   aspirin 325 MG EC tablet Take 1 tablet (325 mg total) by mouth daily. Replaces:  aspirin 81 MG tablet   carvedilol 6.25 MG tablet Commonly known as:  COREG Take 6.25 mg by mouth 2 (two) times daily with a meal.   clopidogrel 75 MG tablet Commonly known as:  PLAVIX Take 75 mg by mouth daily.   cyclobenzaprine 10 MG tablet Commonly known as:  FLEXERIL Take 10 mg by mouth at bedtime.   escitalopram 20 MG tablet Commonly known as:  LEXAPRO Take 20 mg by mouth daily.   ferrous gluconate 324 MG tablet Commonly known as:  FERGON Take 1 tablet (324 mg total) by mouth 2 (two) times daily with a meal.   furosemide 20 MG tablet Commonly known as:  LASIX Take 1 tablet (20 mg total) by mouth daily as needed. For  weight gain of 3-5 lbs What changed:    when to take this  reasons to take this  additional instructions   gabapentin 300 MG capsule Commonly known as:  NEURONTIN Take 600 mg by mouth 3 (three) times daily.   levothyroxine 75 MCG tablet Commonly known as:  SYNTHROID, LEVOTHROID Take 75 mcg by mouth daily before breakfast.   lovastatin 20 MG tablet Commonly known as:  MEVACOR Take 20 mg by mouth at bedtime.   nitroGLYCERIN 0.4 MG SL tablet Commonly known as:  NITROSTAT Place 1 tablet (0.4 mg total) under the tongue every 5 (five) minutes x 3 doses as needed. What changed:  reasons to take this   oxyCODONE-acetaminophen 10-325 MG tablet Commonly known as:  PERCOCET Take 1 tablet by mouth every 6 (six) hours as needed for pain.   potassium chloride 10 MEQ tablet Commonly known as:  K-DUR Take 1 tablet (10 mEq total) by mouth daily as needed. On days you take Lasix What changed:    when to take this  reasons to take this  additional instructions   tamsulosin 0.4 MG Caps capsule Commonly known as:  FLOMAX Take 0.4 mg by mouth daily.   traZODone 100 MG tablet Commonly known as:  DESYREL Take 100 mg by mouth at bedtime.      Follow-up Information    Gaye Pollack, MD Follow up on 08/22/2018.   Specialty:  Cardiothoracic Surgery Why:  Appointment is at 10:00, please get CXR at 9:30 at [place] located on first floor of our office building Contact information: [place] [address] [phone]        [facility] Follow up on 08/06/2018.   Specialty:  Cardiothoracic Surgery Why:  Appointment is at 10:00 for suture removal with nurse Contact information: [place], [place], [place], PA-C Follow up on 08/07/2018.   Specialties:  Physician Assistant, Cardiology Why:  Appointment is at [facility]  information: [place] [address] [phone]           Signed: Ellwood Handler, PA-C  07/29/2018, 8:40 AM

## 2018-07-25 NOTE — Progress Notes (Signed)
1 Day Post-Op Procedure(s) (LRB): CORONARY ARTERY BYPASS GRAFTING (CABG) (N/A) TRANSESOPHAGEAL ECHOCARDIOGRAM (TEE) (N/A) Subjective: Incisional soreness but otherwise ok. Wants foley out.  Objective: Vital signs in last 24 hours: Temp:  [95.9 F (35.5 C)-100 F (37.8 C)] 99 F (37.2 C) (01/22 0700) Pulse Rate:  [89-90] 90 (01/22 0700) Cardiac Rhythm: (P) Atrial paced (01/22 0400) Resp:  [11-37] 18 (01/22 0700) BP: (82-126)/(62-87) 119/77 (01/22 0700) SpO2:  [100 %] 100 % (01/22 0700) Arterial Line BP: (89-139)/(46-73) 124/55 (01/22 0700) FiO2 (%):  [50 %] 50 % (01/21 1515) Weight:  [81.5 kg] 81.5 kg (01/22 0500)  Hemodynamic parameters for last 24 hours: PAP: (11-28)/(2-17) 14/3 CO:  [3 L/min-4.7 L/min] 4.6 L/min CI:  [1.5 L/min/m2-2.3 L/min/m2] 2.3 L/min/m2  Intake/Output from previous day: 01/21 0701 - 01/22 0700 In: 5016.2 [I.V.:2821.2; Blood:350; IV Piggyback:1845] Out: 8563 [Urine:4415; Blood:700; Chest Tube:620] Intake/Output this shift: No intake/output data recorded.  General appearance: alert and cooperative Neurologic: intact Heart: regular rate and rhythm, S1, S2 normal, no murmur, click, rub or gallop Lungs: clear to auscultation bilaterally Extremities: extremities normal, atraumatic, no cyanosis or edema Wound: dressings dry  Lab Results: Recent Labs    07/24/18 1842 07/24/18 1846 07/25/18 0416  WBC 10.7*  --  12.9*  HGB 9.1* 8.5* 8.2*  HCT 26.9* 25.0* 24.8*  PLT 143*  --  144*   BMET:  Recent Labs    07/24/18 1204  07/24/18 1842 07/24/18 1846 07/25/18 0416  NA 139   < >  --  140 137  K 3.4*   < >  --  5.0 4.7  CL  --   --   --   --  106  CO2  --   --   --   --  23  GLUCOSE 147*  --   --   --  118*  BUN  --   --   --   --  10  CREATININE  --   --  0.94  --  0.90  CALCIUM  --   --   --   --  8.2*   < > = values in this interval not displayed.    PT/INR:  Recent Labs    07/24/18 1321  LABPROT 15.8*  INR 1.27   ABG    Component  Value Date/Time   PHART 7.359 07/24/2018 1846   HCO3 22.4 07/24/2018 1846   TCO2 24 07/24/2018 1846   ACIDBASEDEF 3.0 (H) 07/24/2018 1846   O2SAT 99.0 07/24/2018 1846   CBG (last 3)  Recent Labs    07/25/18 0526 07/25/18 0627 07/25/18 0731  GLUCAP 97 131* 129*   CXR: clear  ECG: sinus, no acute changes.  Assessment/Plan: S/P Procedure(s) (LRB): CORONARY ARTERY BYPASS GRAFTING (CABG) (N/A) TRANSESOPHAGEAL ECHOCARDIOGRAM (TEE) (N/A)  POD 1 Hemodynamically stable in sinus rhythm. Continue Lopressor.  DC chest tubes, swan, arterial line and foley.  OOB, IS, ambulate.  No hx of DM and preop Hgb A1c was 5.6. Still on insulin drip probably because he received a dose of Solumedrol postop for acute rash. Will give a dose of Levemir today and SSI. Probably stop tomorrow.   LOS: 1 day    Roy [name] 07/25/2018

## 2018-07-25 NOTE — Progress Notes (Signed)
CT surgery p.m. Rounds  Patient had excellent postop day 1 Lines out, ambulated 1 lap around the unit Currently on room air Minimal pain A.m. labs satisfactory

## 2018-07-25 NOTE — Anesthesia Postprocedure Evaluation (Signed)
Anesthesia Post Note  Patient: Roy [name]  Procedure(s) Performed: CORONARY ARTERY BYPASS GRAFTING (CABG) (N/A Chest) TRANSESOPHAGEAL ECHOCARDIOGRAM (TEE) (N/A )     Patient location during evaluation: ICU Anesthesia Type: General Level of consciousness: awake and alert Pain management: pain level controlled Vital Signs Assessment: post-procedure vital signs reviewed and stable Respiratory status: spontaneous breathing, nonlabored ventilation, respiratory function stable and patient connected to nasal cannula oxygen Cardiovascular status: blood pressure returned to baseline and stable Postop Assessment: no apparent nausea or vomiting Anesthetic complications: no Comments: Patient extubated POD#0, doing well.    Last Vitals:  Vitals:   07/25/18 0800 07/25/18 0821  BP: 131/87   Pulse: 94   Resp: 18   Temp: 37.1 C   SpO2: 100% 97%                  Audry Pili

## 2018-07-25 NOTE — Discharge Instructions (Signed)

## 2018-07-26 ENCOUNTER — Inpatient Hospital Stay (HOSPITAL_COMMUNITY): Payer: Medicare HMO

## 2018-07-26 LAB — CBC
HCT: 22.1 % — ABNORMAL LOW (ref 39.0–52.0)
HCT: 22.2 % — ABNORMAL LOW (ref 39.0–52.0)
HEMOGLOBIN: 7 g/dL — AB (ref 13.0–17.0)
HEMOGLOBIN: 7.3 g/dL — AB (ref 13.0–17.0)
MCH: 29.7 pg (ref 26.0–34.0)
MCH: 30.3 pg (ref 26.0–34.0)
MCHC: 31.7 g/dL (ref 30.0–36.0)
MCHC: 32.9 g/dL (ref 30.0–36.0)
MCV: 93.6 fL (ref 80.0–100.0)
MCV: 92.1 fL (ref 80.0–100.0)
Platelets: 125 10*3/uL — ABNORMAL LOW (ref 150–400)
Platelets: 139 10*3/uL — ABNORMAL LOW (ref 150–400)
RBC: 2.36 MIL/uL — AB (ref 4.22–5.81)
RBC: 2.41 MIL/uL — AB (ref 4.22–5.81)
RDW: 15.1 % (ref 11.5–15.5)
RDW: 15.1 % (ref 11.5–15.5)
WBC: 13 10*3/uL — ABNORMAL HIGH (ref 4.0–10.5)
WBC: 14.1 10*3/uL — ABNORMAL HIGH (ref 4.0–10.5)
nRBC: 0 % (ref 0.0–0.2)
nRBC: 0 % (ref 0.0–0.2)

## 2018-07-26 LAB — BASIC METABOLIC PANEL
Anion gap: 5 (ref 5–15)
BUN: 14 mg/dL (ref 8–23)
CO2: 26 mmol/L (ref 22–32)
Calcium: 8.3 mg/dL — ABNORMAL LOW (ref 8.9–10.3)
Chloride: 106 mmol/L (ref 98–111)
Creatinine, Ser: 0.93 mg/dL (ref 0.61–1.24)
GFR calc Af Amer: >60 mL/min (ref >60)
GFR calc non Af Amer: >60 mL/min (ref >60)
GLUCOSE: 114 mg/dL — AB (ref 70–99)
POTASSIUM: 4.5 mmol/L (ref 3.5–5.1)
Sodium: 137 mmol/L (ref 135–145)

## 2018-07-26 LAB — GLUCOSE, CAPILLARY: Glucose-Capillary: 97 mg/dL (ref 70–99)

## 2018-07-26 MED ORDER — ONDANSETRON HCL 4 MG PO TABS: 4.0000 mg | ORAL_TABLET | Freq: Four times a day (QID) | ORAL | Status: DC | PRN

## 2018-07-26 MED ORDER — OXYCODONE HCL 5 MG PO TABS
5.0000 mg | ORAL_TABLET | ORAL | Status: DC | PRN
  Administered 2018-07-26 – 2018-07-28 (×8): 10 mg via ORAL
  Filled 2018-07-26: qty 2
  Filled 2018-07-26: qty 1
  Filled 2018-07-26 (×6): qty 2

## 2018-07-26 MED ORDER — MOVING RIGHT ALONG BOOK
Freq: Once | Status: AC
  Administered 2018-07-27
  Filled 2018-07-26: qty 1

## 2018-07-26 MED ORDER — ATORVASTATIN CALCIUM 80 MG PO TABS
80.0000 mg | ORAL_TABLET | Freq: Every day | ORAL | Status: DC
  Administered 2018-07-26 – 2018-07-28 (×3): 80 mg via ORAL
  Filled 2018-07-26 (×3): qty 1

## 2018-07-26 MED ORDER — ACETAMINOPHEN 325 MG PO TABS: 650.0000 mg | ORAL_TABLET | Freq: Four times a day (QID) | ORAL | Status: DC | PRN

## 2018-07-26 MED ORDER — SODIUM CHLORIDE 0.9% FLUSH: 3.0000 mL | INTRAVENOUS | Status: DC | PRN

## 2018-07-26 MED ORDER — PANTOPRAZOLE SODIUM 40 MG PO TBEC
40.0000 mg | DELAYED_RELEASE_TABLET | Freq: Every day | ORAL | Status: DC
  Administered 2018-07-27 – 2018-07-29 (×3): 40 mg via ORAL
  Filled 2018-07-26 (×3): qty 1

## 2018-07-26 MED ORDER — TRAMADOL HCL 50 MG PO TABS
50.0000 mg | ORAL_TABLET | Freq: Four times a day (QID) | ORAL | Status: DC | PRN
  Administered 2018-07-27 (×3): 50 mg via ORAL
  Filled 2018-07-26 (×3): qty 1

## 2018-07-26 MED ORDER — SODIUM CHLORIDE 0.9 % IV SOLN: 250.0000 mL | INTRAVENOUS | Status: DC | PRN

## 2018-07-26 MED ORDER — ONDANSETRON HCL 4 MG/2ML IJ SOLN: 4.0000 mg | Freq: Four times a day (QID) | INTRAMUSCULAR | Status: DC | PRN

## 2018-07-26 MED ORDER — ASPIRIN EC 325 MG PO TBEC
325.0000 mg | DELAYED_RELEASE_TABLET | Freq: Every day | ORAL | Status: DC
  Administered 2018-07-27 – 2018-07-29 (×3): 325 mg via ORAL
  Filled 2018-07-26 (×3): qty 1

## 2018-07-26 MED ORDER — FERROUS GLUCONATE 324 (38 FE) MG PO TABS
324.0000 mg | ORAL_TABLET | Freq: Two times a day (BID) | ORAL | Status: DC
  Administered 2018-07-26 – 2018-07-29 (×7): 324 mg via ORAL
  Filled 2018-07-26 (×10): qty 1

## 2018-07-26 MED ORDER — CARVEDILOL 3.125 MG PO TABS
3.1250 mg | ORAL_TABLET | Freq: Two times a day (BID) | ORAL | Status: DC
  Administered 2018-07-27 – 2018-07-28 (×4): 3.125 mg via ORAL
  Filled 2018-07-26 (×5): qty 1

## 2018-07-26 MED ORDER — SODIUM CHLORIDE 0.9% FLUSH
3.0000 mL | Freq: Two times a day (BID) | INTRAVENOUS | Status: DC
  Administered 2018-07-26 – 2018-07-28 (×5): 3 mL via INTRAVENOUS

## 2018-07-26 MED FILL — Heparin Sodium (Porcine) Inj 1000 Unit/ML: INTRAMUSCULAR | Qty: 30 | Status: AC

## 2018-07-26 MED FILL — Sodium Chloride IV Soln 0.9%: INTRAVENOUS | Qty: 2000 | Status: AC

## 2018-07-26 MED FILL — Electrolyte-R (PH 7.4) Solution: INTRAVENOUS | Qty: 4000 | Status: AC

## 2018-07-26 MED FILL — Lidocaine HCl(Cardiac) IV PF Soln Pref Syr 100 MG/5ML (2%): INTRAVENOUS | Qty: 5 | Status: AC

## 2018-07-26 MED FILL — Sodium Bicarbonate IV Soln 8.4%: INTRAVENOUS | Qty: 50 | Status: AC

## 2018-07-26 MED FILL — Potassium Chloride Inj 2 mEq/ML: INTRAVENOUS | Qty: 40 | Status: AC

## 2018-07-26 MED FILL — Heparin Sodium (Porcine) Inj 1000 Unit/ML: INTRAMUSCULAR | Qty: 10 | Status: AC

## 2018-07-26 MED FILL — Magnesium Sulfate Inj 50%: INTRAMUSCULAR | Qty: 10 | Status: AC

## 2018-07-26 MED FILL — Mannitol IV Soln 20%: INTRAVENOUS | Qty: 500 | Status: AC

## 2018-07-26 NOTE — Progress Notes (Signed)
      [address]       [place] [address]             [phone]      POD # 2 CABG  BP (!) 130/58   Pulse 73   Temp 97.8 F (36.6 C) (Axillary)   Resp 20   Ht 5\' 9"  (1.753 m)   Wt 83 kg   SpO2 98%   BMI 27.02 kg/m   Intake/Output Summary (Last 24 hours) at 07/26/2018 1905 Last data filed at 07/26/2018 1822 Gross per 24 hour  Intake 902.77 ml  Output 3100 ml  Net -2197.23 ml   Hct 22 on repeat- no change  Awaiting bed on 4E  Chavie Kolinski C. Roxan Hockey, MD [facility] [phone]

## 2018-07-26 NOTE — Progress Notes (Signed)
2 Days Post-Op Procedure(s) (LRB): CORONARY ARTERY BYPASS GRAFTING (CABG) (N/A) TRANSESOPHAGEAL ECHOCARDIOGRAM (TEE) (N/A) Subjective: Feels well overall but sore. Ambulating well, eating well.  Objective: Vital signs in last 24 hours: Temp:  [97.7 F (36.5 C)-98.5 F (36.9 C)] 98.2 F (36.8 C) (01/23 0751) Pulse Rate:  [70-90] 89 (01/23 0700) Cardiac Rhythm: Normal sinus rhythm (01/23 0400) Resp:  [12-38] 36 (01/23 0700) BP: (80-139)/(49-101) 110/74 (01/23 0700) SpO2:  [90 %-100 %] 92 % (01/23 0700) Weight:  [83 kg] 83 kg (01/23 0500)  Hemodynamic parameters for last 24 hours:    Intake/Output from previous day: 01/22 0701 - 01/23 0700 In: 1141.6 [P.O.:850; I.V.:141.6; IV Piggyback:150] Out: 6629 [Urine:4255; Chest Tube:80] Intake/Output this shift: No intake/output data recorded.  General appearance: alert and cooperative Neurologic: intact Heart: regular rate and rhythm, S1, S2 normal, no murmur, click, rub or gallop Lungs: diminished breath sounds bibasilar Extremities: extremities normal, atraumatic, no cyanosis or edema Wound: dressings dry  Lab Results: Recent Labs    07/25/18 1641 07/26/18 0318  WBC 19.7* 13.0*  HGB 8.3* 7.0*  HCT 25.3* 22.1*  PLT 177 125*   BMET:  Recent Labs    07/25/18 1641 07/26/18 0318  NA 137 137  K 4.3 4.5  CL 104 106  CO2 24 26  GLUCOSE 139* 114*  BUN 10 14  CREATININE 0.93 0.93  CALCIUM 8.7* 8.3*    PT/INR:  Recent Labs    07/24/18 1321  LABPROT 15.8*  INR 1.27   ABG    Component Value Date/Time   PHART 7.359 07/24/2018 1846   HCO3 22.4 07/24/2018 1846   TCO2 24 07/24/2018 1846   ACIDBASEDEF 3.0 (H) 07/24/2018 1846   O2SAT 99.0 07/24/2018 1846   CBG (last 3)  Recent Labs    07/25/18 1144 07/25/18 2208 07/26/18 0653  GLUCAP 101* 124* 97   CXR: left basilar atelectasis and small effusion  Assessment/Plan: S/P Procedure(s) (LRB): CORONARY ARTERY BYPASS GRAFTING (CABG) (N/A) TRANSESOPHAGEAL  ECHOCARDIOGRAM (TEE) (N/A)  POD 2 Hemodynamically stable in sinus rhythm. Continue Lopressor.  Acute postop blood loss anemia: Hgb dropped from 8.3 yesterday to 7.0 this am despite diuresing -3L yesterday. Will repeat to be sure it is accurate before considering transfusion. Could be dilutional if drawn from sleeve. Start iron.  Glucose under good control. DC CBG's  IS, ambulation.  Transfer to 4E after CBC repeat.   LOS: 2 days    Gaye Pollack 07/26/2018

## 2018-07-26 NOTE — Plan of Care (Signed)
Pt in bed. Receiving 1u of PRBC. Vital signs stable. PRN meds given as ordered. No complaints at this time. No new changes at this time. Will continue to monitor.  Problem: Education: Goal: Will demonstrate proper wound care and an understanding of methods to prevent future damage Outcome: Progressing Goal: Knowledge of disease or condition will improve Outcome: Progressing Goal: Knowledge of the prescribed therapeutic regimen will improve Outcome: Progressing Goal: Individualized Educational Video(s) Outcome: Progressing   Problem: Cardiac: Goal: Will achieve and/or maintain hemodynamic stability Outcome: Progressing   Problem: Clinical Measurements: Goal: Postoperative complications will be avoided or minimized Outcome: Progressing   Problem: Respiratory: Goal: Respiratory status will improve Outcome: Progressing   Problem: Skin Integrity: Goal: Wound healing without signs and symptoms of infection Outcome: Progressing Goal: Risk for impaired skin integrity will decrease Outcome: Progressing   Problem: Urinary Elimination: Goal: Ability to achieve and maintain adequate renal perfusion and functioning will improve Outcome: Progressing

## 2018-07-27 ENCOUNTER — Encounter (HOSPITAL_COMMUNITY): Payer: Self-pay | Admitting: *Deleted

## 2018-07-27 ENCOUNTER — Inpatient Hospital Stay (HOSPITAL_COMMUNITY): Payer: Medicare HMO

## 2018-07-27 LAB — CBC
HCT: 28.7 % — ABNORMAL LOW (ref 39.0–52.0)
Hemoglobin: 9.1 g/dL — ABNORMAL LOW (ref 13.0–17.0)
MCH: 29.4 pg (ref 26.0–34.0)
MCHC: 31.7 g/dL (ref 30.0–36.0)
MCV: 92.6 fL (ref 80.0–100.0)
Platelets: 147 10*3/uL — ABNORMAL LOW (ref 150–400)
RBC: 3.1 MIL/uL — ABNORMAL LOW (ref 4.22–5.81)
RDW: 14.4 % (ref 11.5–15.5)
WBC: 14.4 10*3/uL — ABNORMAL HIGH (ref 4.0–10.5)
nRBC: 0 % (ref 0.0–0.2)

## 2018-07-27 LAB — HEMOGLOBIN AND HEMATOCRIT, BLOOD
HCT: 28.4 % — ABNORMAL LOW (ref 39.0–52.0)
Hemoglobin: 9.3 g/dL — ABNORMAL LOW (ref 13.0–17.0)

## 2018-07-27 NOTE — Progress Notes (Signed)
Patient ID: Roy [name], male   DOB: 1956-02-06, 63 y.o.   MRN: [id number] EVENING ROUNDS NOTE :     [address]       [place] [address]             [phone]                 3 Days Post-Op Procedure(s) (LRB): CORONARY ARTERY BYPASS GRAFTING (CABG) (N/A) TRANSESOPHAGEAL ECHOCARDIOGRAM (TEE) (N/A)  Total Length of Stay:  LOS: 3 days  BP (!) 159/86   Pulse 99   Temp 98.5 F (36.9 C) (Oral)   Resp 14   Ht 5\' 9"  (1.753 m)   Wt 80.7 kg   SpO2 94%   BMI 26.27 kg/m   .Intake/Output      01/24 0701 - 01/25 0700   P.O.    I.V. (mL/kg)    Blood    Total Intake(mL/kg)    Urine (mL/kg/hr) 225 (0.2)   Total Output 225   Net -225       Urine Occurrence 1 x     . sodium chloride       Lab Results  Component Value Date   WBC 14.4 (H) 07/27/2018   HGB 9.1 (L) 07/27/2018   HCT 28.7 (L) 07/27/2018   PLT 147 (L) 07/27/2018   GLUCOSE 114 (H) 07/26/2018   CHOL 208 (H) 04/03/2013   TRIG 156 (H) 04/03/2013   HDL 34 (L) 04/03/2013   LDLCALC 143 (H) 04/03/2013   ALT 14 07/20/2018   AST 11 (L) 07/20/2018   NA 137 07/26/2018   K 4.5 07/26/2018   CL 106 07/26/2018   CREATININE 0.93 07/26/2018   BUN 14 07/26/2018   CO2 26 07/26/2018   TSH 2.849 06/14/2011   INR 1.27 07/24/2018   HGBA1C 5.6 07/20/2018   Stable, waiting for stepdown bed   Grace Isaac MD  [facility] [phone] Office [phone] 07/27/2018 8:33 PM

## 2018-07-27 NOTE — Progress Notes (Signed)
3 Days Post-Op Procedure(s) (LRB): CORONARY ARTERY BYPASS GRAFTING (CABG) (N/A) TRANSESOPHAGEAL ECHOCARDIOGRAM (TEE) (N/A) Subjective: Sore but otherwise feels well  Objective: Vital signs in last 24 hours: Temp:  [97.8 F (36.6 C)-98.6 F (37 C)] 98.1 F (36.7 C) (01/24 0001) Pulse Rate:  [62-103] 103 (01/24 0600) Cardiac Rhythm: Normal sinus rhythm (01/24 0400) Resp:  [9-34] 23 (01/24 0600) BP: (74-149)/(48-77) 126/56 (01/24 0500) SpO2:  [89 %-100 %] 94 % (01/24 0600) Weight:  [80.7 kg] 80.7 kg (01/24 0500)  Hemodynamic parameters for last 24 hours:    Intake/Output from previous day: 01/23 0701 - 01/24 0700 In: 1217.8 [P.O.:750; I.V.:152.8; Blood:315] Out: 2300 [Urine:2300] Intake/Output this shift: No intake/output data recorded.  General appearance: alert and cooperative Heart: regular rate and rhythm, S1, S2 normal, no murmur, click, rub or gallop Lungs: clear to auscultation bilaterally Extremities: extremities normal, atraumatic, no cyanosis or edema Wound: dressings dry  Lab Results: Recent Labs    07/26/18 0831 07/26/18 2327 07/27/18 0224  WBC 14.1*  --  14.4*  HGB 7.3* 9.3* 9.1*  HCT 22.2* 28.4* 28.7*  PLT 139*  --  147*   BMET:  Recent Labs    07/25/18 1641 07/26/18 0318  NA 137 137  K 4.3 4.5  CL 104 106  CO2 24 26  GLUCOSE 139* 114*  BUN 10 14  CREATININE 0.93 0.93  CALCIUM 8.7* 8.3*    PT/INR:  Recent Labs    07/24/18 1321  LABPROT 15.8*  INR 1.27   ABG    Component Value Date/Time   PHART 7.359 07/24/2018 1846   HCO3 22.4 07/24/2018 1846   TCO2 24 07/24/2018 1846   ACIDBASEDEF 3.0 (H) 07/24/2018 1846   O2SAT 99.0 07/24/2018 1846   CBG (last 3)  Recent Labs    07/25/18 1144 07/25/18 2208 07/26/18 0653  GLUCAP 101* 124* 97   CXR: mild left base atelectasis  Assessment/Plan: S/P Procedure(s) (LRB): CORONARY ARTERY BYPASS GRAFTING (CABG) (N/A) TRANSESOPHAGEAL ECHOCARDIOGRAM (TEE) (N/A)   POD 3 Hemodynamically  stable in sinus rhythm. Continue Coreg. Increase to preop dose as tolerated.  Acute postop blood loss anemia: Hgb improved after transfusion. Continue iron.  Hyperlipidemia: was on Mevacor before. Switched to high dose Lipitor.   COPD: continue IS, ambulation  Wt is at preop.  Awaiting bed on 4E.  DC pacing wires in am if no arrhythmias.  Probably home Sunday with his wife.  LOS: 3 days    Gaye Pollack 07/27/2018

## 2018-07-27 NOTE — Progress Notes (Signed)
CARDIAC REHAB PHASE I   PRE:  Rate/Rhythm: 80 SR  BP:  Sitting: 111/67      SaO2: 94 RA  MODE:  Ambulation: 370 ft 111 peak HR  POST:  Rate/Rhythm: 82 SR  BP:  Sitting: 140/86    SaO2: 96 RA   Pt ambulated 364ft in hallway standby assist with front wheel walker. Pt denies CP, c/o slight SOB at end of walk. Pt returned to bed, call bell within reach. Encouraged pt to continue using IS. Will continue to follow.  [place], RN BSN 07/27/2018 1:49 PM

## 2018-07-27 NOTE — Plan of Care (Signed)
  Problem: Education: Goal: Will demonstrate proper wound care and an understanding of methods to prevent future damage Outcome: Progressing Goal: Knowledge of disease or condition will improve Outcome: Progressing Goal: Knowledge of the prescribed therapeutic regimen will improve Outcome: Progressing   Problem: Cardiac: Goal: Will achieve and/or maintain hemodynamic stability Outcome: Progressing   Problem: Clinical Measurements: Goal: Postoperative complications will be avoided or minimized Outcome: Progressing   Problem: Respiratory: Goal: Respiratory status will improve Outcome: Progressing   Problem: Skin Integrity: Goal: Wound healing without signs and symptoms of infection Outcome: Progressing Goal: Risk for impaired skin integrity will decrease Outcome: Progressing   Problem: Urinary Elimination: Goal: Ability to achieve and maintain adequate renal perfusion and functioning will improve Outcome: Progressing

## 2018-07-28 NOTE — Progress Notes (Signed)
Patient ID: Roy [name], male   DOB: Mar 22, 1956, 63 y.o.   MRN: [id number] TCTS DAILY ICU PROGRESS NOTE                   [address]            [place] [address]          [phone]   4 Days Post-Op Procedure(s) (LRB): CORONARY ARTERY BYPASS GRAFTING (CABG) (N/A) TRANSESOPHAGEAL ECHOCARDIOGRAM (TEE) (N/A)  Total Length of Stay:  LOS: 4 days   Subjective: Feels well no complaints  Objective: Vital signs in last 24 hours: Temp:  [97.8 F (36.6 C)-98.5 F (36.9 C)] 97.8 F (36.6 C) (01/25 0751) Pulse Rate:  [86-102] 102 (01/25 0801) Cardiac Rhythm: Normal sinus rhythm (01/25 0700) Resp:  [14] 14 (01/24 0900) BP: (112-159)/(60-86) 119/82 (01/25 0801) Weight:  [78.5 kg] 78.5 kg (01/25 0600)  Filed Weights   07/26/18 0500 07/27/18 0500 07/28/18 0600  Weight: 83 kg 80.7 kg 78.5 kg    Weight change: -2.2 kg   Hemodynamic parameters for last 24 hours:    Intake/Output from previous day: 01/24 0701 - 01/25 0700 In: 240 [P.O.:240] Out: 1175 [Urine:1175]  Intake/Output this shift: No intake/output data recorded.  Current Meds: Scheduled Meds: . aspirin EC  325 mg Oral Daily  . atorvastatin  80 mg Oral q1800  . carvedilol  3.125 mg Oral BID WC  . cyclobenzaprine  10 mg Oral QHS  . escitalopram  20 mg Oral Daily  . ferrous gluconate  324 mg Oral BID WC  . gabapentin  600 mg Oral TID  . levothyroxine  75 mcg Oral QAC breakfast  . pantoprazole  40 mg Oral QAC breakfast  . sodium chloride flush  3 mL Intravenous Q12H  . tamsulosin  0.4 mg Oral Daily  . traZODone  100 mg Oral QHS   Continuous Infusions: . sodium chloride     PRN Meds:.sodium chloride, acetaminophen, ondansetron **OR** ondansetron (ZOFRAN) IV, oxyCODONE, sodium chloride flush, traMADol  General appearance: alert, cooperative and no distress Neurologic: intact Heart: regular rate and rhythm, S1, S2 normal, no murmur, click, rub or gallop Lungs: clear to auscultation  bilaterally Abdomen: soft, non-tender; bowel sounds normal; no masses,  no organomegaly Extremities: extremities normal, atraumatic, no cyanosis or edema and Homans sign is negative, no sign of DVT Wound: Sternum stable healing well  Lab Results: CBC: Recent Labs    07/26/18 0831 07/26/18 2327 07/27/18 0224  WBC 14.1*  --  14.4*  HGB 7.3* 9.3* 9.1*  HCT 22.2* 28.4* 28.7*  PLT 139*  --  147*   BMET:  Recent Labs    07/25/18 1641 07/26/18 0318  NA 137 137  K 4.3 4.5  CL 104 106  CO2 24 26  GLUCOSE 139* 114*  BUN 10 14  CREATININE 0.93 0.93  CALCIUM 8.7* 8.3*    CMET: Lab Results  Component Value Date   WBC 14.4 (H) 07/27/2018   HGB 9.1 (L) 07/27/2018   HCT 28.7 (L) 07/27/2018   PLT 147 (L) 07/27/2018   GLUCOSE 114 (H) 07/26/2018   CHOL 208 (H) 04/03/2013   TRIG 156 (H) 04/03/2013   HDL 34 (L) 04/03/2013   LDLCALC 143 (H) 04/03/2013   ALT 14 07/20/2018   AST 11 (L) 07/20/2018   NA 137 07/26/2018   K 4.5 07/26/2018   CL 106 07/26/2018   CREATININE 0.93 07/26/2018   BUN 14 07/26/2018   CO2 26  07/26/2018   TSH 2.849 06/14/2011   INR 1.27 07/24/2018   HGBA1C 5.6 07/20/2018      PT/INR: No results for input(s): LABPROT, INR in the last 72 hours. Radiology: No results found.   Assessment/Plan: S/P Procedure(s) (LRB): CORONARY ARTERY BYPASS GRAFTING (CABG) (N/A) TRANSESOPHAGEAL ECHOCARDIOGRAM (TEE) (N/A) Mobilize Diuresis Plan for transfer to step-down: see transfer orders still waiting for transfer to stepdown Holding sinus rhythm will DC pacing wires    Grace Isaac 07/28/2018 8:26 AM

## 2018-07-28 NOTE — Progress Notes (Signed)
Patient refused morning walk. Nurse discussed with patient the importance of mobility and possible complications with lack of mobility. Patient verbalizes understanding.

## 2018-07-28 NOTE — Progress Notes (Signed)
Pt currently on bedrest. Was going to start education, but pt was asleep. Also had no visitors sign on door. Will follow up later with pt to review education.   Returned to pt's room. Pt still in bed asleep. Spoke with pt briefly about willingness to review education. Pt states he would rather wait until wife returns tomorrow. Informed pt that CR would not be able to see him tomorrow. Left education papers for wife to review. Spoke with pt about Cardiac Rehab and left brochure for review. Will send referral to [facility] per pt's request. Also spoke with pt about eating. Pt still had lunch tray at beside. Pt stated he was not ready to eat. Encouraged pt to ambulate later, eat and also continue to use IS.    10:06  1:12-1:25  Carma Lair MS, ACSM CEP 1:22 PM 07/28/2018

## 2018-07-29 LAB — BASIC METABOLIC PANEL
Anion gap: 11 (ref 5–15)
BUN: 18 mg/dL (ref 8–23)
CO2: 22 mmol/L (ref 22–32)
Calcium: 9 mg/dL (ref 8.9–10.3)
Chloride: 102 mmol/L (ref 98–111)
Creatinine, Ser: 1.03 mg/dL (ref 0.61–1.24)
GFR calc Af Amer: >60 mL/min (ref >60)
GFR calc non Af Amer: >60 mL/min (ref >60)
Glucose, Bld: 117 mg/dL — ABNORMAL HIGH (ref 70–99)
Potassium: 3.7 mmol/L (ref 3.5–5.1)
Sodium: 135 mmol/L (ref 135–145)

## 2018-07-29 LAB — CBC
HCT: 32.2 % — ABNORMAL LOW (ref 39.0–52.0)
Hemoglobin: 11 g/dL — ABNORMAL LOW (ref 13.0–17.0)
MCH: 30.5 pg (ref 26.0–34.0)
MCHC: 34.2 g/dL (ref 30.0–36.0)
MCV: 89.2 fL (ref 80.0–100.0)
Platelets: 247 10*3/uL (ref 150–400)
RBC: 3.61 MIL/uL — ABNORMAL LOW (ref 4.22–5.81)
RDW: 13.8 % (ref 11.5–15.5)
WBC: 11.4 10*3/uL — ABNORMAL HIGH (ref 4.0–10.5)
nRBC: 0 % (ref 0.0–0.2)

## 2018-07-29 MED ORDER — POTASSIUM CHLORIDE ER 10 MEQ PO TBCR: 10.0000 meq | EXTENDED_RELEASE_TABLET | Freq: Every day | ORAL | Status: DC | PRN

## 2018-07-29 MED ORDER — FERROUS GLUCONATE 324 (38 FE) MG PO TABS: 324.0000 mg | ORAL_TABLET | Freq: Two times a day (BID) | ORAL | 0 refills | Status: DC

## 2018-07-29 MED ORDER — ASPIRIN 325 MG PO TBEC: 325.0000 mg | DELAYED_RELEASE_TABLET | Freq: Every day | ORAL | 0 refills | Status: DC

## 2018-07-29 MED ORDER — CARVEDILOL 6.25 MG PO TABS
6.2500 mg | ORAL_TABLET | Freq: Two times a day (BID) | ORAL | Status: DC
  Administered 2018-07-29: 6.25 mg via ORAL
  Filled 2018-07-29: qty 1

## 2018-07-29 MED ORDER — FUROSEMIDE 20 MG PO TABS: 20.0000 mg | ORAL_TABLET | Freq: Every day | ORAL | Status: DC | PRN

## 2018-07-29 NOTE — Plan of Care (Signed)
  Problem: Education: Goal: Knowledge of disease or condition will improve Outcome: Progressing   Problem: Clinical Measurements: Goal: Postoperative complications will be avoided or minimized Outcome: Progressing   Problem: Skin Integrity: Goal: Risk for impaired skin integrity will decrease Outcome: Progressing   Problem: Urinary Elimination: Goal: Ability to achieve and maintain adequate renal perfusion and functioning will improve Outcome: Progressing

## 2018-07-29 NOTE — Progress Notes (Signed)
Discharge AVS meds take and those due reviewed with pt. Follow up appointments and when to call MD reviewed. Reviewed sternal precautions and incision care All questions and concerns addressed. No further questions at this time. .D/c IV and TELE, CCMD notified. D/C home per orders. Brought down via wheelchair with staff and family.

## 2018-07-29 NOTE — Progress Notes (Signed)
Notified by CCMD that patient has 12 second run SVT. Patient returned to NSR. Patient asymptomatic. Will continue to monitor

## 2018-07-29 NOTE — Progress Notes (Addendum)
      [address]       [place] [address]             [phone]      5 Days Post-Op Procedure(s) (LRB): CORONARY ARTERY BYPASS GRAFTING (CABG) (N/A) TRANSESOPHAGEAL ECHOCARDIOGRAM (TEE) (N/A)   Subjective:  No new complaints.  Feels good and is ready to go home.  + ambulation  + BM  Objective: Vital signs in last 24 hours: Temp:  [97.5 F (36.4 C)-98.3 F (36.8 C)] 98.3 F (36.8 C) (01/26 0532) Pulse Rate:  [79-95] 79 (01/26 0108) Cardiac Rhythm: Supraventricular tachycardia (01/25 1946) Resp:  [12-22] 22 (01/26 0532) BP: (91-128)/(49-98) 120/86 (01/26 0532) SpO2:  [92 %-100 %] 100 % (01/26 0532) Weight:  [77.6 kg] 77.6 kg (01/26 0532)  Intake/Output from previous day: 01/25 0701 - 01/26 0700 In: 480 [P.O.:480] Out: 100 [Urine:100]  General appearance: alert, cooperative and no distress Heart: regular rate and rhythm Lungs: clear to auscultation bilaterally Abdomen: soft, non-tender; bowel sounds normal; no masses,  no organomegaly Extremities: edema trace Wound: clean and dry  Lab Results: Recent Labs    07/27/18 0224 07/29/18 0304  WBC 14.4* 11.4*  HGB 9.1* 11.0*  HCT 28.7* 32.2*  PLT 147* 247   BMET:  Recent Labs    07/29/18 0304  NA 135  K 3.7  CL 102  CO2 22  GLUCOSE 117*  BUN 18  CREATININE 1.03  CALCIUM 9.0    PT/INR: No results for input(s): LABPROT, INR in the last 72 hours. ABG    Component Value Date/Time   PHART 7.359 07/24/2018 1846   HCO3 22.4 07/24/2018 1846   TCO2 24 07/24/2018 1846   ACIDBASEDEF 3.0 (H) 07/24/2018 1846   O2SAT 99.0 07/24/2018 1846   CBG (last 3)  No results for input(s): GLUCAP in the last 72 hours.  Assessment/Plan: S/P Procedure(s) (LRB): CORONARY ARTERY BYPASS GRAFTING (CABG) (N/A) TRANSESOPHAGEAL ECHOCARDIOGRAM (TEE) (N/A)  1. CV- Sinus Tach at times, BP controlled- increase Coreg to 6.25 mg BID 2. Pulm- no acute issues, continue IS 3. Renal- creatinine has been stable,  weight is stable, no edema on exam, no lasix at this time 4. Dispo- patient stable, will d/c home today, he will not be given a prescription for narcotis, he uses chronically at home and just filled a prescription for 120 Percocet on 1/14   LOS: 5 days    Ellwood Handler 07/29/2018

## 2018-07-30 LAB — TYPE AND SCREEN
ABO/RH(D): O POS
Antibody Screen: NEGATIVE
Unit division: 0
Unit division: 0
Unit division: 0
Unit division: 0

## 2018-07-30 LAB — BPAM RBC
BLOOD PRODUCT EXPIRATION DATE: 202002222359
Blood Product Expiration Date: 202002202359
Blood Product Expiration Date: 202002222359
Blood Product Expiration Date: 202002222359
ISSUE DATE / TIME: 202001231703
ISSUE DATE / TIME: 202001210951
Unit Type and Rh: 5100
Unit Type and Rh: 5100
Unit Type and Rh: 5100
Unit Type and Rh: 5100

## 2018-08-01 ENCOUNTER — Telehealth: Payer: Self-pay

## 2018-08-01 NOTE — Telephone Encounter (Signed)
Patient's wife contacted the office concerned about her husband and his bruising since surgery.  He is s/p CABG with Dr. Cyndia Bent on 07/24/2018.  She stated that he has has a lot of bruising since and wanted to make sure this was normal.  She stated that his incisions were healing and he has no redness at his incision.  But she stated he is on Plavix and has bruised.  I advised patient's spouse that this is a normal part of the healing process and is to be expected, she acknowledged receipt.  All questions were answered and she thanked me for the return call.

## 2018-08-03 DIAGNOSIS — Z87891 Personal history of nicotine dependence: Secondary | ICD-10-CM | POA: Diagnosis not present

## 2018-08-03 DIAGNOSIS — Z6827 Body mass index (BMI) 27.0-27.9, adult: Secondary | ICD-10-CM | POA: Diagnosis not present

## 2018-08-03 DIAGNOSIS — I2510 Atherosclerotic heart disease of native coronary artery without angina pectoris: Secondary | ICD-10-CM | POA: Diagnosis not present

## 2018-08-03 DIAGNOSIS — I509 Heart failure, unspecified: Secondary | ICD-10-CM | POA: Diagnosis not present

## 2018-08-03 DIAGNOSIS — J449 Chronic obstructive pulmonary disease, unspecified: Secondary | ICD-10-CM | POA: Diagnosis not present

## 2018-08-03 DIAGNOSIS — Z299 Encounter for prophylactic measures, unspecified: Secondary | ICD-10-CM | POA: Diagnosis not present

## 2018-08-03 DIAGNOSIS — I10 Essential (primary) hypertension: Secondary | ICD-10-CM | POA: Diagnosis not present

## 2018-08-06 ENCOUNTER — Encounter (INDEPENDENT_AMBULATORY_CARE_PROVIDER_SITE_OTHER): Payer: Self-pay

## 2018-08-06 DIAGNOSIS — Z4802 Encounter for removal of sutures: Secondary | ICD-10-CM

## 2018-08-07 ENCOUNTER — Encounter: Payer: Self-pay | Admitting: Student

## 2018-08-07 ENCOUNTER — Ambulatory Visit (INDEPENDENT_AMBULATORY_CARE_PROVIDER_SITE_OTHER): Payer: Medicare HMO | Admitting: Student

## 2018-08-07 VITALS — BP 124/80 | HR 70 | Ht 69.0 in | Wt 179.0 lb

## 2018-08-07 DIAGNOSIS — Z951 Presence of aortocoronary bypass graft: Secondary | ICD-10-CM

## 2018-08-07 DIAGNOSIS — I25118 Atherosclerotic heart disease of native coronary artery with other forms of angina pectoris: Secondary | ICD-10-CM | POA: Diagnosis not present

## 2018-08-07 DIAGNOSIS — D649 Anemia, unspecified: Secondary | ICD-10-CM | POA: Diagnosis not present

## 2018-08-07 DIAGNOSIS — I10 Essential (primary) hypertension: Secondary | ICD-10-CM | POA: Diagnosis not present

## 2018-08-07 DIAGNOSIS — E785 Hyperlipidemia, unspecified: Secondary | ICD-10-CM

## 2018-08-07 NOTE — Progress Notes (Addendum)
Cardiology Office Note    Date:  08/07/2018   ID:  Roy [name], Roy [name] [birth year], MRN [id number]  PCP:  Monico Blitz, MD  Cardiologist: Kate Sable, MD    Chief Complaint  Patient presents with  . Hospitalization Follow-up    s/p CABG    History of Present Illness:    Roy [name] is a 63 y.o. male with past medical history of CAD (s/p BMS to OM and RCA, cath in 11/2013 with DES to mid-LAD, DES to distal-LAD, and DES to RCA), HTN, HLD, and Hepatitis C who presents to the office today for hospital follow-up.  He was examined by Dr. Bronson Ing in 06/2018 and had recently undergone stress testing which showed moderate peri-infarct ischemia and was an intermediate risk study. He denied any chest pain at that time but had chronic dyspnea on exertion and had recently been experiencing left hand numbness and tingling. Given his history of multiple stents, a repeat cardiac catheterization was recommended for definitive evaluation. This was performed by Dr. Irish Lack on 07/24/2018 and showed severe multivessel CAD with multiple branches involved including the distal left main, ostial LAD, mid LAD, and noted CTO of the large diagonal branch. Given the significant progression since prior catheterization in 2015, CT surgery consult was recommended for consideration of CABG. He was evaluated by Dr. Cyndia Bent on 07/13/2018 as an outpatient and was felt to be a surgical candidate, therefore he presented to [facility] on 07/24/2018 for planned CABG. He underwent CABG x4 with LIMA-LAD, SVG-PDA, and Seq-SVG-OM-RI. Did experience post-operative anemia (received transfusion with Hgb 11.0 at discharge) but maintained NSR during the post-operative setting. He was discharged on 07/29/2018 with ASA 325mg  daily, Coreg 6.25mg  BID, Plavix 75mg  daily, and Lovastatin 20mg  daily with Amlodipine 10mg  daily and Lisinopril 5 mg daily being discontinued due to hypotension.   In talking with the patient today, he reports  overall doing well since his recent hospitalization. He reports his incisional sternal discomfort continues to improve and he denies any exertional pain. No recent dyspnea on exertion or recurrent left arm pain. Has been walking around his house for exercise and denies any anginal symptoms with this. No recent orthopnea, PND, or lower extremity edema.  He has not checked his blood pressure in the the ambulatory setting but this is well controlled at 124/80 during today's visit. Reports good compliance with his current medication regimen including ASA and Plavix.  Denies any evidence of active bleeding.   Past Medical History:  Diagnosis Date  . Abdominal pain, right upper quadrant   . Arthritis    "all my joints" (11/28/2013)  . Blind left eye   . CAD (coronary artery disease)    a. Prior BMS to OM and RCA, subsequent angioplasty due to ISR in these distributions, also BMS placement with DES to LAD. b. Cath 01/2013: diffuse 3V CAD without clear culprit lesion, normal LVEF, mid LAD lesion noted, treat medically. c. Cath 11/28/2013 DES to mid LAD lesion with abnormal FFR, DES to distal LAD lesion, DES to prox RCA d. 07/24/2018:CABG x4 with LIMA-LAD, SVG-PDA, and Seq-SVG-OM-RI.  [name] Chronic lower back pain   . COPD (chronic obstructive pulmonary disease) ([place])   . DDD (degenerative disc disease)   . Depression   . GERD (gastroesophageal reflux disease)   . Hepatitis C C   "had the tx and I'm free of it"  . History of blood transfusion    "when I was taking tx for hepatitis"  . History  of kidney stones   . Hyperlipidemia   . Hypertension   . Hypothyroidism   . Kidney stones    "no surgeries"  . Macular degeneration of both eyes    "already blind in my left eye"  . Myocardial infarction ([place]) ~ 2007   . Other and unspecified angina pectoris   . Scoliosis   . Sinus bradycardia     Past Surgical History:  Procedure Laterality Date  . BACK SURGERY     x3 for scoliosis  . CARDIAC  CATHETERIZATION     "I've had a few without intervention"  . COLONOSCOPY    . COLONOSCOPY N/A 05/02/2013   Procedure: COLONOSCOPY;  Surgeon: Rogene Houston, MD;  [facility];  Service: Endoscopy;  Laterality: N/A;  1030-moved to 1055 Ann to notify pt  . COLONOSCOPY N/A 07/06/2017   Procedure: COLONOSCOPY;  Surgeon: Rogene Houston, MD;  [facility];  Service: Endoscopy;  Laterality: N/A;  12:45  . CORONARY ANGIOPLASTY WITH STENT PLACEMENT   2007; 2008; 11/28/2013   "2 + 2; + 3"  . CORONARY ARTERY BYPASS GRAFT N/A 07/24/2018   Procedure: CORONARY ARTERY BYPASS GRAFTING (CABG);  Surgeon: Gaye Pollack, MD;  Location: [place];  Service: Open Heart Surgery;  Laterality: N/A;  Times 4 using left internal mammary artery and endoscopically harvested right saphenous vein  . INGUINAL HERNIA REPAIR Bilateral ~ 2000  . LEFT HEART CATH AND CORONARY ANGIOGRAPHY N/A 07/12/2018   Procedure: LEFT HEART CATH AND CORONARY ANGIOGRAPHY;  Surgeon: Jettie Booze, MD;  Location: [place] [facility];  Service: Cardiovascular;  Laterality: N/A;  . LEFT HEART CATHETERIZATION WITH CORONARY ANGIOGRAM N/A 01/30/2013   Procedure: LEFT HEART CATHETERIZATION WITH CORONARY ANGIOGRAM;  Surgeon: Jolaine Artist, MD;  Location: [facility] CATH LAB;  Service: Cardiovascular;  Laterality: N/A;  . LEFT HEART CATHETERIZATION WITH CORONARY ANGIOGRAM N/A 11/28/2013   Procedure: LEFT HEART CATHETERIZATION WITH CORONARY ANGIOGRAM;  Surgeon: Jettie Booze, MD;  Location: [facility] CATH LAB;  Service: Cardiovascular;  Laterality: N/A;  . LIVER BIOPSY  03/24/2010  . POLYPECTOMY  07/06/2017   Procedure: POLYPECTOMY;  Surgeon: Rogene Houston, MD;  [facility];  Service: Endoscopy;;  colon  . TEE WITHOUT CARDIOVERSION N/A 07/24/2018   Procedure: TRANSESOPHAGEAL ECHOCARDIOGRAM (TEE);  Surgeon: Gaye Pollack, MD;  Location: [place];  Service: Open Heart Surgery;  Laterality: N/A;  . TOOTH EXTRACTION      Current  Medications: Outpatient Medications Prior to Visit  Medication Sig Dispense Refill  . aspirin EC 325 MG EC tablet Take 1 tablet (325 mg total) by mouth daily. 30 tablet 0  . carvedilol (COREG) 6.25 MG tablet Take 6.25 mg by mouth 2 (two) times daily with a meal.    . clopidogrel (PLAVIX) 75 MG tablet Take 75 mg by mouth daily.  30 tablet 6  . cyclobenzaprine (FLEXERIL) 10 MG tablet Take 10 mg by mouth at bedtime.     [name] escitalopram (LEXAPRO) 20 MG tablet Take 20 mg by mouth daily.     . ferrous gluconate (FERGON) 324 MG tablet Take 1 tablet (324 mg total) by mouth 2 (two) times daily with a meal. 60 tablet 0  . furosemide (LASIX) 20 MG tablet Take 1 tablet (20 mg total) by mouth daily as needed. For weight gain of 3-5 lbs 30 tablet   . gabapentin (NEURONTIN) 300 MG capsule Take 600 mg by mouth 3 (three) times daily.     [name]  levothyroxine (SYNTHROID, LEVOTHROID) 75 MCG tablet Take 75 mcg by mouth daily before breakfast.    . lovastatin (MEVACOR) 20 MG tablet Take 20 mg by mouth at bedtime.    . nitroGLYCERIN (NITROSTAT) 0.4 MG SL tablet Place 1 tablet (0.4 mg total) under the tongue every 5 (five) minutes x 3 doses as needed. (Patient taking differently: Place 0.4 mg under the tongue every 5 (five) minutes x 3 doses as needed for chest pain. ) 25 tablet 3  . oxyCODONE-acetaminophen (PERCOCET) 10-325 MG tablet Take 1 tablet by mouth every 6 (six) hours as needed for pain.    . potassium chloride (K-DUR) 10 MEQ tablet Take 1 tablet (10 mEq total) by mouth daily as needed. On days you take Lasix    . tamsulosin (FLOMAX) 0.4 MG CAPS capsule Take 0.4 mg by mouth daily.     . traZODone (DESYREL) 100 MG tablet Take 100 mg by mouth at bedtime.     No facility-administered medications prior to visit.      Allergies:   Penicillins; Imdur [isosorbide nitrate]; Codeine; and Food   Social History   Socioeconomic History  . Marital status: Legally Separated    Spouse name: Not on file  . Number of  children: Not on file  . Years of education: Not on file  . Highest education level: Not on file  Occupational History  . Not on file  Social Needs  . Financial resource strain: Not on file  . Food insecurity:    Worry: Not on file    Inability: Not on file  . Transportation needs:    Medical: Not on file    Non-medical: Not on file  Tobacco Use  . Smoking status: Former Smoker    Packs/day: 1.50    Years: 35.00    Pack years: 52.50    Types: Cigarettes    Start date: 07/20/1971    Last attempt to quit: 02/27/2006    Years since quitting: 12.4  . Smokeless tobacco: Never Used  Substance and Sexual Activity  . Alcohol use: No    Alcohol/week: 0.0 standard drinks  . Drug use: No    Comment: 11/28/2013 "did qthing in my teens and 20's; none since"  . Sexual activity: Yes  Lifestyle  . Physical activity:    Days per week: Not on file    Minutes per session: Not on file  . Stress: Not on file  Relationships  . Social connections:    Talks on phone: Not on file    Gets together: Not on file    Attends religious service: Not on file    Active member of club or organization: Not on file    Attends meetings of clubs or organizations: Not on file    Relationship status: Not on file  Other Topics Concern  . Not on file  Social History Narrative  . Not on file     Family History:  The patient's family history includes Diabetes in his brother, brother, and mother; Healthy in his brother, son, and son; Heart disease in his brother.   Review of Systems:   Please see the history of present illness.     General:  No chills, fever, night sweats or weight changes.  Cardiovascular:  No dyspnea on exertion, edema, orthopnea, palpitations, paroxysmal nocturnal dyspnea. Positive for sternal chest pain (improving).  Dermatological: No rash, lesions/masses Respiratory: No cough, dyspnea Urologic: No hematuria, dysuria Abdominal:   No nausea, vomiting, diarrhea, bright red blood  per  rectum, melena, or hematemesis Neurologic:  No visual changes, wkns, changes in mental status. All other systems reviewed and are otherwise negative except as noted above.   Physical Exam:    VS:  BP 124/80   Pulse 70   Ht 5\' 9"  (1.753 m)   Wt 179 lb (81.2 kg)   SpO2 96%   BMI 26.43 kg/m    General: Well developed, well nourished Caucasian male appearing in no acute distress. Head: Normocephalic, atraumatic, sclera non-icteric, no xanthomas, nares are without discharge.  Neck: No carotid bruits. JVD not elevated.  Lungs: Respirations regular and unlabored, without wheezes or rales.  Heart: Regular rate and rhythm. No S3 or S4.  No murmur, no rubs, or gallops appreciated. Sternal incision appears well-healing with no erythema or drainage.  Abdomen: Soft, non-tender, non-distended with normoactive bowel sounds. No hepatomegaly. No rebound/guarding. No obvious abdominal masses. Msk:  Strength and tone appear normal for age. No joint deformities or effusions. Extremities: No clubbing or cyanosis. No lower extremity edema.  Distal pedal pulses are 2+ bilaterally. Vein graft harvest sites appear well healing.  Neuro: Alert and oriented X 3. Moves all extremities spontaneously. No focal deficits noted. Psych:  Responds to questions appropriately with a normal affect. Skin: No rashes or lesions noted  Wt Readings from Last 3 Encounters:  08/07/18 179 lb (81.2 kg)  07/29/18 171 lb 1.2 oz (77.6 kg)  07/20/18 182 lb 4.8 oz (82.7 kg)     Studies/Labs Reviewed:   EKG:  EKG is ordered today.  The ekg ordered today demonstrates NSR, HR 65, with diffuse TWI along Leads I, AVL, V2-V3.   Recent Labs: 07/20/2018: ALT 14 07/25/2018: Magnesium 2.4 07/29/2018: BUN 18; Creatinine, Ser 1.03; Hemoglobin 11.0; Platelets 247; Potassium 3.7; Sodium 135   Lipid Panel    Component Value Date/Time   CHOL 208 (H) 04/03/2013 0826   TRIG 156 (H) 04/03/2013 0826   HDL 34 (L) 04/03/2013 0826   CHOLHDL 6.1  04/03/2013 0826   VLDL 31 04/03/2013 0826   LDLCALC 143 (H) 04/03/2013 0826    Additional studies/ records that were reviewed today include:   Cardiac Catheterization: 07/12/2018  Previously placed Prox RCA to Mid RCA stent (unknown type) is widely patent.  Mid LM to Dist LM lesion is 40% stenosed.  Mid RCA lesion is 10% stenosed.  Ost Ramus to Ramus lesion is 95% stenosed.  Ost 1st Mrg to 1st Mrg lesion is 95% stenosed.  Ost LAD lesion is 50% stenosed.  Previously placed Prox LAD stent (unknown type) is widely patent.  Ost 1st Diag to 1st Diag lesion is 99% stenosed.  Ost 2nd Diag to 2nd Diag lesion is 70% stenosed.  Mid LAD-1 lesion is 75% stenosed.  Previously placed Mid LAD-2 stent (unknown type) is widely patent.  Dist RCA lesion is 25% stenosed.  Dist Cx lesion is 90% stenosed.  The left ventricular systolic function is normal.  LV end diastolic pressure is normal.  The left ventricular ejection fraction is 55-65% by visual estimate.  There is no aortic valve stenosis.   Severe multivessel CAD.  Multiple branches involved and moderate distal left main and ostial LAD disease as well.  Severe mid LAD disease.  CTO of large diagonal branch.   When compared to the 2015 cath, there has been significant progression of the mid LAD disease, branch vessel disease and mild progression of the distal left main disease.  Plan for Cardiac surgery consult for CABG.  TEE: 07/24/2018  Aortic valve: The valve is trileaflet. No stenosis. No regurgitation.  Right ventricle: Normal cavity size, wall thickness and ejection fraction.  Mitral valve: Trace regurgitation.     Assessment:    1. Coronary artery disease involving native coronary artery of native heart with other form of angina pectoris ([place])   2. S/P CABG x 4   3. Essential hypertension   4. Hyperlipidemia LDL goal <70   5. Anemia, unspecified type      Plan:   In order of problems listed  above:  1. CAD - the patient has undergone multiple stents in the past and recent cardiac catheterization showed severe multivessel CAD as outlined above. He underwent CABG x4 with LIMA-LAD, SVG-PDA, and Seq-SVG-OM-RI on 07/24/2018. Has overall progressed well since and is experiencing expected sternal discomfort but denies any recurrent anginal symptoms.   - He was discharged on ASA 325 mg daily along with Plavix 75 mg daily. I will send a staff message to Dr. Cyndia Bent today to see if he wishes for him to remain on Plavix as this was continued at the time of discharge from his PTA medication list but he did not receive Plavix during admission following surgery. Continue statin and BB therapy.   ADDENDUM: Reviewed with Dr. Cyndia Bent and the patient can stop Plavix while remaining on ASA 325mg  daily. Called and updated the patient with this information. Medication list updated.   2. HTN - BP is well controlled at 124/80 during today's visit. He remains on Coreg 6.25 mg twice daily. Lisinopril and Amlodipine were discontinued during recent admission due to hypotension. I encouraged him to follow BP in the ambulatory setting. Would plan to restart Lisinopril if BP becomes elevated in the future.  3. HLD - This has been followed by his PCP but he is unaware of the last time lipids were obtained. He remains on Lovastatin 20 mg daily and reports never being on higher intensity statins in the past. Will recheck FLP and if not at goal of LDL < 70, will switch to Crestor 20mg  daily.   4. Post-Operative Anemia - Hemoglobin declined to 7.0 following recent CABG and he required transfusions, improved to 11.0 at the time of discharge on 07/29/2018. Denies any evidence of active bleeding. Will recheck CBC.    Medication Adjustments/Labs and Tests Ordered: Current medicines are reviewed at length with the patient today.  Concerns regarding medicines are outlined above.  Medication changes, Labs and Tests ordered today  are listed in the Patient Instructions below. Patient Instructions  Medication Instructions:  Your physician recommends that you continue on your current medications as directed. Please refer to the Current Medication list given to you today.  If you need a refill on your cardiac medications before your next appointment, please call your pharmacy.   Lab work: Your physician recommends that you return for lab work in: Fasting   If you have labs (blood work) drawn today and your tests are completely normal, you will receive your results only by: [name] MyChart Message (if you have MyChart) OR . A paper copy in the mail If you have any lab test that is abnormal or we need to change your treatment, we will call you to review the results.  Testing/Procedures: NONE   Follow-Up: At [facility], you and your health needs are our priority.  As part of our continuing mission to provide you with exceptional heart care, we have created designated Provider Care Teams.  These Care Teams  include your primary Cardiologist (physician) and Advanced Practice Providers (APPs -  Physician Assistants and Nurse Practitioners) who all work together to provide you with the care you need, when you need it. You will need a follow up appointment in 3 months.  Please call our office 2 months in advance to schedule this appointment.  You may see Kate Sable, MD or one of the following Advanced Practice Providers on your designated Care Team:   Bernerd Pho, PA-C [facility]) . Ermalinda Barrios, PA-C ([place])  Any Other Special Instructions Will Be Listed Below (If Applicable). Thank you for choosing [facility]!    Signed, Erma Heritage, PA-C  08/07/2018 4:57 PM    [place] [address] [place],  [address] Phone: [phone] Fax: [phone]

## 2018-08-07 NOTE — Patient Instructions (Signed)
Medication Instructions:  Your physician recommends that you continue on your current medications as directed. Please refer to the Current Medication list given to you today.  If you need a refill on your cardiac medications before your next appointment, please call your pharmacy.   Lab work: Your physician recommends that you return for lab work in: Fasting   If you have labs (blood work) drawn today and your tests are completely normal, you will receive your results only by: [name] MyChart Message (if you have MyChart) OR . A paper copy in the mail If you have any lab test that is abnormal or we need to change your treatment, we will call you to review the results.  Testing/Procedures: NONE   Follow-Up: At [facility], you and your health needs are our priority.  As part of our continuing mission to provide you with exceptional heart care, we have created designated Provider Care Teams.  These Care Teams include your primary Cardiologist (physician) and Advanced Practice Providers (APPs -  Physician Assistants and Nurse Practitioners) who all work together to provide you with the care you need, when you need it. You will need a follow up appointment in 3 months.  Please call our office 2 months in advance to schedule this appointment.  You may see Kate Sable, MD or one of the following Advanced Practice Providers on your designated Care Team:   Bernerd Pho, PA-C [facility]) . Ermalinda Barrios, PA-C ([place])  Any Other Special Instructions Will Be Listed Below (If Applicable). Thank you for choosing [facility]!

## 2018-08-08 ENCOUNTER — Other Ambulatory Visit: Payer: Self-pay | Admitting: Student

## 2018-08-08 DIAGNOSIS — E785 Hyperlipidemia, unspecified: Secondary | ICD-10-CM | POA: Diagnosis not present

## 2018-08-08 DIAGNOSIS — D649 Anemia, unspecified: Secondary | ICD-10-CM | POA: Diagnosis not present

## 2018-08-14 ENCOUNTER — Encounter: Payer: Self-pay | Admitting: *Deleted

## 2018-08-15 ENCOUNTER — Telehealth: Payer: Self-pay | Admitting: Student

## 2018-08-15 DIAGNOSIS — E782 Mixed hyperlipidemia: Secondary | ICD-10-CM

## 2018-08-15 NOTE — Telephone Encounter (Signed)
   Please let the patient know that I did review his blood work from [facility]. Hemoglobin remains stable at 10.6.  Platelet count was elevated to 651 and this was normal during admission, therefore recommend this continued to be followed by his PCP.  In looking at his cholesterol levels, his LDL was elevated to 111 and we try to get this less than 70 in the setting of known CAD. He is currently taking Lovastatin 20 mg daily and he told me at the time of his last office visit he had never been on a different statin. Would recommend stopping Lovastatin and switching to Crestor 20 mg daily. He will need a repeat FLP and LFT's in 6-8 weeks.  Signed, Erma Heritage, PA-C 08/15/2018, 11:56 AM Pager: [phone]

## 2018-08-16 NOTE — Telephone Encounter (Signed)
Called patient with test results. No answer. Left message to call back.  

## 2018-08-21 ENCOUNTER — Other Ambulatory Visit: Payer: Self-pay | Admitting: *Deleted

## 2018-08-21 DIAGNOSIS — Z951 Presence of aortocoronary bypass graft: Secondary | ICD-10-CM

## 2018-08-22 ENCOUNTER — Other Ambulatory Visit: Payer: Self-pay

## 2018-08-22 ENCOUNTER — Ambulatory Visit (INDEPENDENT_AMBULATORY_CARE_PROVIDER_SITE_OTHER): Payer: Self-pay | Admitting: Surgery

## 2018-08-22 ENCOUNTER — Encounter: Payer: Self-pay | Admitting: Surgery

## 2018-08-22 ENCOUNTER — Ambulatory Visit
Admission: RE | Admit: 2018-08-22 | Discharge: 2018-08-22 | Disposition: A | Discharge status: Home / Self Care | Payer: Medicare HMO | Source: Ambulatory Visit | Attending: Surgery | Admitting: Surgery

## 2018-08-22 VITALS — BP 140/92 | HR 91 | Resp 18 | Ht 69.0 in | Wt 178.0 lb

## 2018-08-22 DIAGNOSIS — Z951 Presence of aortocoronary bypass graft: Secondary | ICD-10-CM

## 2018-08-22 DIAGNOSIS — J9811 Atelectasis: Secondary | ICD-10-CM | POA: Diagnosis not present

## 2018-08-22 DIAGNOSIS — J90 Pleural effusion, not elsewhere classified: Secondary | ICD-10-CM | POA: Diagnosis not present

## 2018-08-22 MED ORDER — ROSUVASTATIN CALCIUM 20 MG PO TABS: 20.0000 mg | ORAL_TABLET | Freq: Every day | ORAL | 3 refills | Status: DC

## 2018-08-22 NOTE — Progress Notes (Signed)
    HPI: Patient returns for routine postoperative follow-up having undergone coronary bypass graft surgery x5 on 07/24/2018. The patient's early postoperative recovery while in the hospital was notable for an uncomplicated postoperative course.. Since hospital discharge the patient reports that he has been feeling well.  He has minimal incisional discomfort.  He has been walking without chest pain or shortness of breath..   Current Outpatient Medications  Medication Sig Dispense Refill  . aspirin EC 325 MG EC tablet Take 1 tablet (325 mg total) by mouth daily. 30 tablet 0  . carvedilol (COREG) 6.25 MG tablet Take 6.25 mg by mouth 2 (two) times daily with a meal.    . cyclobenzaprine (FLEXERIL) 10 MG tablet Take 10 mg by mouth at bedtime.     [name] escitalopram (LEXAPRO) 20 MG tablet Take 20 mg by mouth daily.     . ferrous gluconate (FERGON) 324 MG tablet Take 1 tablet (324 mg total) by mouth 2 (two) times daily with a meal. 60 tablet 0  . furosemide (LASIX) 20 MG tablet Take 1 tablet (20 mg total) by mouth daily as needed. For weight gain of 3-5 lbs 30 tablet   . gabapentin (NEURONTIN) 300 MG capsule Take 600 mg by mouth 3 (three) times daily.     [name] levothyroxine (SYNTHROID, LEVOTHROID) 75 MCG tablet Take 75 mcg by mouth daily before breakfast.    . lovastatin (MEVACOR) 20 MG tablet Take 20 mg by mouth at bedtime.    . nitroGLYCERIN (NITROSTAT) 0.4 MG SL tablet Place 1 tablet (0.4 mg total) under the tongue every 5 (five) minutes x 3 doses as needed. (Patient taking differently: Place 0.4 mg under the tongue every 5 (five) minutes x 3 doses as needed for chest pain. ) 25 tablet 3  . oxyCODONE-acetaminophen (PERCOCET) 10-325 MG tablet Take 1 tablet by mouth every 6 (six) hours as needed for pain.    . potassium chloride (K-DUR) 10 MEQ tablet Take 1 tablet (10 mEq total) by mouth daily as needed. On days you take Lasix    . tamsulosin (FLOMAX) 0.4 MG CAPS capsule Take 0.4 mg by mouth daily.     .  traZODone (DESYREL) 100 MG tablet Take 100 mg by mouth at bedtime.     No current facility-administered medications for this visit.     Physical Exam: BP (!) 140/92 (BP Location: Right Arm, Patient Position: Sitting, Cuff Size: Large)   Pulse 91   Resp 18   Ht 5\' 9"  (1.753 m)   Wt 178 lb (80.7 kg)   SpO2 98% Comment: RA  BMI 26.29 kg/m  He looks well. Cardiac exam shows a regular rate and rhythm with normal heart sounds. Lung exam is clear. The chest incision is healing well and the sternum is stable. The right leg incisions are healing well and there is no peripheral edema.  Diagnostic Tests:  Chest x-ray today is clear with no pleural effusions.  Impression:  Overall I think Mr. Isidore is making a good recovery following coronary bypass graft surgery.  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  Plan:  He is going to follow-up with Dr. Bronson Ing in [place].  He will return to see me if he has any problems with his incisions.   Gaye Pollack, MD [facility] [phone]

## 2018-08-22 NOTE — Telephone Encounter (Signed)
Returning call to Kisha  °

## 2018-08-22 NOTE — Telephone Encounter (Signed)
Pt.notified

## 2018-08-22 NOTE — Addendum Note (Signed)
Addended by: Levonne Hubert on: 08/22/2018 01:33 PM   Modules accepted: Orders

## 2018-08-29 ENCOUNTER — Telehealth: Payer: Self-pay | Admitting: Cardiovascular Disease

## 2018-08-29 MED ORDER — ROSUVASTATIN CALCIUM 20 MG PO TABS: 20.0000 mg | ORAL_TABLET | Freq: Every day | ORAL | 3 refills | Status: DC

## 2018-08-29 NOTE — Telephone Encounter (Signed)
Done

## 2018-08-29 NOTE — Telephone Encounter (Signed)
Pt states [facility] hasn't received his Rx for Crestor

## 2018-09-03 DIAGNOSIS — Z6829 Body mass index (BMI) 29.0-29.9, adult: Secondary | ICD-10-CM | POA: Diagnosis not present

## 2018-09-03 DIAGNOSIS — I509 Heart failure, unspecified: Secondary | ICD-10-CM | POA: Diagnosis not present

## 2018-09-03 DIAGNOSIS — Z299 Encounter for prophylactic measures, unspecified: Secondary | ICD-10-CM | POA: Diagnosis not present

## 2018-09-03 DIAGNOSIS — I2510 Atherosclerotic heart disease of native coronary artery without angina pectoris: Secondary | ICD-10-CM | POA: Diagnosis not present

## 2018-09-03 DIAGNOSIS — J449 Chronic obstructive pulmonary disease, unspecified: Secondary | ICD-10-CM | POA: Diagnosis not present

## 2018-09-03 DIAGNOSIS — I10 Essential (primary) hypertension: Secondary | ICD-10-CM | POA: Diagnosis not present

## 2018-09-13 DIAGNOSIS — M4126 Other idiopathic scoliosis, lumbar region: Secondary | ICD-10-CM | POA: Diagnosis not present

## 2018-09-13 DIAGNOSIS — M47816 Spondylosis without myelopathy or radiculopathy, lumbar region: Secondary | ICD-10-CM | POA: Diagnosis not present

## 2018-11-01 ENCOUNTER — Telehealth: Payer: Self-pay | Admitting: *Deleted

## 2018-11-01 NOTE — Telephone Encounter (Signed)
Patient verbally consented for telehealth visits with [facility] and understands that his insurance company will be billed for the encounter.   Aware to have vitals available 

## 2018-11-05 ENCOUNTER — Encounter: Payer: Self-pay | Admitting: Cardiovascular Disease

## 2018-11-05 ENCOUNTER — Telehealth (INDEPENDENT_AMBULATORY_CARE_PROVIDER_SITE_OTHER): Payer: Medicare HMO | Admitting: Cardiovascular Disease

## 2018-11-05 VITALS — Ht 69.0 in | Wt 180.0 lb

## 2018-11-05 DIAGNOSIS — I10 Essential (primary) hypertension: Secondary | ICD-10-CM

## 2018-11-05 DIAGNOSIS — I25708 Atherosclerosis of coronary artery bypass graft(s), unspecified, with other forms of angina pectoris: Secondary | ICD-10-CM | POA: Diagnosis not present

## 2018-11-05 DIAGNOSIS — E785 Hyperlipidemia, unspecified: Secondary | ICD-10-CM

## 2018-11-05 DIAGNOSIS — Z951 Presence of aortocoronary bypass graft: Secondary | ICD-10-CM

## 2018-11-05 NOTE — Progress Notes (Signed)
Virtual Visit via Video Note   This visit type was conducted due to national recommendations for restrictions regarding the COVID-19 Pandemic (e.g. social distancing) in an effort to limit this patient's exposure and mitigate transmission in our community.  Due to his co-morbid illnesses, this patient is at least at moderate risk for complications without adequate follow up.  This format is felt to be most appropriate for this patient at this time.  All issues noted in this document were discussed and addressed.  A limited physical exam was performed with this format.  Please refer to the patient's chart for his consent to telehealth for [facility].   Date:  11/05/2018   ID:  Roy [name], Roy [name] Mar 17, 1956, MRN [id number]  Patient Location: Home Provider Location: Home  PCP:  Monico Blitz, MD  Cardiologist:  Kate Sable, MD  Electrophysiologist:  None   Evaluation Performed:  Follow-Up Visit  Chief Complaint:  CAD  History of Present Illness:    Roy [name] is a 63 y.o. male with a h/o CAD. He underwent 4-vessel CABG on 07/24/18 with LIMA-LAD, SVG-PDA, and Seq-SVG-OM-RI.  He is doing well. He denies chest pain and palpitations. He only gets short of breath when he has significant back pain.   The patient does not have symptoms concerning for COVID-19 infection (fever, chills, cough, or new shortness of breath).    Past Medical History:  Diagnosis Date  . Abdominal pain, right upper quadrant   . Arthritis    "all my joints" (11/28/2013)  . Blind left eye   . CAD (coronary artery disease)    a. Prior BMS to OM and RCA, subsequent angioplasty due to ISR in these distributions, also BMS placement with DES to LAD. b. Cath 01/2013: diffuse 3V CAD without clear culprit lesion, normal LVEF, mid LAD lesion noted, treat medically. c. Cath 11/28/2013 DES to mid LAD lesion with abnormal FFR, DES to distal LAD lesion, DES to prox RCA d. 07/24/2018:CABG x4 with LIMA-LAD, SVG-PDA, and  Seq-SVG-OM-RI.  [name] Chronic lower back pain   . COPD (chronic obstructive pulmonary disease) ([place])   . DDD (degenerative disc disease)   . Depression   . GERD (gastroesophageal reflux disease)   . Hepatitis C C   "had the tx and I'm free of it"  . History of blood transfusion    "when I was taking tx for hepatitis"  . History of kidney stones   . Hyperlipidemia   . Hypertension   . Hypothyroidism   . Kidney stones    "no surgeries"  . Macular degeneration of both eyes    "already blind in my left eye"  . Myocardial infarction ([place]) ~ 2007   . Other and unspecified angina pectoris   . Scoliosis   . Sinus bradycardia    Past Surgical History:  Procedure Laterality Date  . BACK SURGERY     x3 for scoliosis  . CARDIAC CATHETERIZATION     "I've had a few without intervention"  . COLONOSCOPY    . COLONOSCOPY N/A 05/02/2013   Procedure: COLONOSCOPY;  Surgeon: Rogene Houston, MD;  [facility];  Service: Endoscopy;  Laterality: N/A;  1030-moved to 1055 Ann to notify pt  . COLONOSCOPY N/A 07/06/2017   Procedure: COLONOSCOPY;  Surgeon: Rogene Houston, MD;  [facility];  Service: Endoscopy;  Laterality: N/A;  12:45  . CORONARY ANGIOPLASTY WITH STENT PLACEMENT   2007; 2008; 11/28/2013   "2 + 2; +  3"  . CORONARY ARTERY BYPASS GRAFT N/A 07/24/2018   Procedure: CORONARY ARTERY BYPASS GRAFTING (CABG);  Surgeon: Gaye Pollack, MD;  Location: [place];  Service: Open Heart Surgery;  Laterality: N/A;  Times 4 using left internal mammary artery and endoscopically harvested right saphenous vein  . INGUINAL HERNIA REPAIR Bilateral ~ 2000  . LEFT HEART CATH AND CORONARY ANGIOGRAPHY N/A 07/12/2018   Procedure: LEFT HEART CATH AND CORONARY ANGIOGRAPHY;  Surgeon: Jettie Booze, MD;  Location: [place] [facility];  Service: Cardiovascular;  Laterality: N/A;  . LEFT HEART CATHETERIZATION WITH CORONARY ANGIOGRAM N/A 01/30/2013   Procedure: LEFT HEART CATHETERIZATION WITH CORONARY  ANGIOGRAM;  Surgeon: Jolaine Artist, MD;  Location: [facility] CATH LAB;  Service: Cardiovascular;  Laterality: N/A;  . LEFT HEART CATHETERIZATION WITH CORONARY ANGIOGRAM N/A 11/28/2013   Procedure: LEFT HEART CATHETERIZATION WITH CORONARY ANGIOGRAM;  Surgeon: Jettie Booze, MD;  Location: [facility] CATH LAB;  Service: Cardiovascular;  Laterality: N/A;  . LIVER BIOPSY  03/24/2010  . POLYPECTOMY  07/06/2017   Procedure: POLYPECTOMY;  Surgeon: Rogene Houston, MD;  [facility];  Service: Endoscopy;;  colon  . TEE WITHOUT CARDIOVERSION N/A 07/24/2018   Procedure: TRANSESOPHAGEAL ECHOCARDIOGRAM (TEE);  Surgeon: Gaye Pollack, MD;  Location: [place];  Service: Open Heart Surgery;  Laterality: N/A;  . TOOTH EXTRACTION       Current Meds  Medication Sig  . aspirin EC 325 MG EC tablet Take 1 tablet (325 mg total) by mouth daily.  . carvedilol (COREG) 6.25 MG tablet Take 6.25 mg by mouth 2 (two) times daily with a meal.  . cyclobenzaprine (FLEXERIL) 10 MG tablet Take 10 mg by mouth at bedtime.   [name] escitalopram (LEXAPRO) 20 MG tablet Take 20 mg by mouth daily.   . ferrous gluconate (FERGON) 324 MG tablet Take 1 tablet (324 mg total) by mouth 2 (two) times daily with a meal.  . furosemide (LASIX) 20 MG tablet Take 1 tablet (20 mg total) by mouth daily as needed. For weight gain of 3-5 lbs  . gabapentin (NEURONTIN) 300 MG capsule Take 600 mg by mouth 3 (three) times daily.   [name] levothyroxine (SYNTHROID, LEVOTHROID) 75 MCG tablet Take 75 mcg by mouth daily before breakfast.  . nitroGLYCERIN (NITROSTAT) 0.4 MG SL tablet Place 1 tablet (0.4 mg total) under the tongue every 5 (five) minutes x 3 doses as needed. (Patient taking differently: Place 0.4 mg under the tongue every 5 (five) minutes x 3 doses as needed for chest pain. )  . oxyCODONE-acetaminophen (PERCOCET) 10-325 MG tablet Take 1 tablet by mouth every 6 (six) hours as needed for pain.  . potassium chloride (K-DUR) 10 MEQ tablet Take 1 tablet (10  mEq total) by mouth daily as needed. On days you take Lasix  . rosuvastatin (CRESTOR) 20 MG tablet Take 1 tablet (20 mg total) by mouth daily.  . tamsulosin (FLOMAX) 0.4 MG CAPS capsule Take 0.4 mg by mouth daily.   . traZODone (DESYREL) 100 MG tablet Take 100 mg by mouth at bedtime.     Allergies:   Penicillins; Imdur [isosorbide nitrate]; Codeine; and Food   Social History   Tobacco Use  . Smoking status: Former Smoker    Packs/day: 1.50    Years: 35.00    Pack years: 52.50    Types: Cigarettes    Start date: 07/20/1971    Last attempt to quit: 02/27/2006    Years since quitting: 12.6  . Smokeless  tobacco: Never Used  Substance Use Topics  . Alcohol use: No    Alcohol/week: 0.0 standard drinks  . Drug use: No    Comment: 11/28/2013 "did qthing in my teens and 20's; none since"     Family Hx: The patient's family history includes Diabetes in his brother, brother, and mother; Healthy in his brother, son, and son; Heart disease in his brother.  ROS:   Please see the history of present illness.     All other systems reviewed and are negative.   Prior CV studies:   The following studies were reviewed today:  Cardiac Catheterization: 07/12/2018  Previously placed Prox RCA to Mid RCA stent (unknown type) is widely patent.  Mid LM to Dist LM lesion is 40% stenosed.  Mid RCA lesion is 10% stenosed.  Ost Ramus to Ramus lesion is 95% stenosed.  Ost 1st Mrg to 1st Mrg lesion is 95% stenosed.  Ost LAD lesion is 50% stenosed.  Previously placed Prox LAD stent (unknown type) is widely patent.  Ost 1st Diag to 1st Diag lesion is 99% stenosed.  Ost 2nd Diag to 2nd Diag lesion is 70% stenosed.  Mid LAD-1 lesion is 75% stenosed.  Previously placed Mid LAD-2 stent (unknown type) is widely patent.  Dist RCA lesion is 25% stenosed.  Dist Cx lesion is 90% stenosed.  The left ventricular systolic function is normal.  LV end diastolic pressure is normal.  The left  ventricular ejection fraction is 55-65% by visual estimate.  There is no aortic valve stenosis.  Severe multivessel CAD. Multiple branches involved and moderate distal left main and ostial LAD disease as well. Severe mid LAD disease. CTO of large diagonal branch.   When compared to the 2015 cath, there has been significant progression of the mid LAD disease, branch vessel disease and mild progression of the distal left main disease.  Plan for Cardiac surgery consult for CABG.   TEE: 07/24/2018  Aortic valve: The valve is trileaflet. No stenosis. No regurgitation.  Right ventricle: Normal cavity size, wall thickness and ejection fraction.  Mitral valve: Trace regurgitation.  Labs/Other Tests and Data Reviewed:    EKG:  No ECG reviewed.  Recent Labs: 07/20/2018: ALT 14 07/25/2018: Magnesium 2.4 07/29/2018: BUN 18; Creatinine, Ser 1.03; Hemoglobin 11.0; Platelets 247; Potassium 3.7; Sodium 135   Recent Lipid Panel Lab Results  Component Value Date/Time   CHOL 208 (H) 04/03/2013 08:26 AM   TRIG 156 (H) 04/03/2013 08:26 AM   HDL 34 (L) 04/03/2013 08:26 AM   CHOLHDL 6.1 04/03/2013 08:26 AM   LDLCALC 143 (H) 04/03/2013 08:26 AM    Wt Readings from Last 3 Encounters:  11/05/18 180 lb (81.6 kg)  08/22/18 178 lb (80.7 kg)  08/07/18 179 lb (81.2 kg)     Objective:    Vital Signs:  Ht 5\' 9"  (1.753 m)   Wt 180 lb (81.6 kg)   BMI 26.58 kg/m    VITAL SIGNS:  reviewed GEN:  no acute distress EYES:  sclerae anicteric, EOMI - Extraocular Movements Intact RESPIRATORY:  normal respiratory effort, symmetric expansion CARDIOVASCULAR:  no peripheral edema MUSCULOSKELETAL:  no obvious deformities. NEURO:  alert and oriented x 3, no obvious focal deficit PSYCH:  normal affect  ASSESSMENT & PLAN:    1. CAD: S/p 4-vessel CABG in January 2020. Symptomatically stable. Continue ASA, beta blocker, and statin.  2. HTN: Needs continued monitoring.  3. Hyperlipidemia: Continue  statin. Needs lipids repeated. LDL 111 on 08/08/18 at [facility].  COVID-19 Education: The signs and symptoms of COVID-19 were discussed with the patient and how to seek care for testing (follow up with PCP or arrange E-visit).  The importance of social distancing was discussed today.  Time:   Today, I have spent 15 minutes with the patient with telehealth technology discussing the above problems.     Medication Adjustments/Labs and Tests Ordered: Current medicines are reviewed at length with the patient today.  Concerns regarding medicines are outlined above.   Tests Ordered: No orders of the defined types were placed in this encounter.   Medication Changes: No orders of the defined types were placed in this encounter.   Disposition:  Follow up in 6 month(s)  Signed, Kate Sable, MD  11/05/2018 1:46 PM    [place]

## 2018-11-05 NOTE — Patient Instructions (Signed)

## 2019-01-21 DIAGNOSIS — Z79899 Other long term (current) drug therapy: Secondary | ICD-10-CM | POA: Diagnosis not present

## 2019-01-21 DIAGNOSIS — R69 Illness, unspecified: Secondary | ICD-10-CM | POA: Diagnosis not present

## 2019-01-21 DIAGNOSIS — I25119 Atherosclerotic heart disease of native coronary artery with unspecified angina pectoris: Secondary | ICD-10-CM | POA: Diagnosis not present

## 2019-01-21 DIAGNOSIS — M5412 Radiculopathy, cervical region: Secondary | ICD-10-CM | POA: Diagnosis not present

## 2019-01-21 DIAGNOSIS — J449 Chronic obstructive pulmonary disease, unspecified: Secondary | ICD-10-CM | POA: Diagnosis not present

## 2019-01-21 DIAGNOSIS — M7061 Trochanteric bursitis, right hip: Secondary | ICD-10-CM | POA: Diagnosis not present

## 2019-01-21 DIAGNOSIS — F1120 Opioid dependence, uncomplicated: Secondary | ICD-10-CM | POA: Diagnosis not present

## 2019-01-21 DIAGNOSIS — M961 Postlaminectomy syndrome, not elsewhere classified: Secondary | ICD-10-CM | POA: Diagnosis not present

## 2019-01-21 DIAGNOSIS — M461 Sacroiliitis, not elsewhere classified: Secondary | ICD-10-CM | POA: Diagnosis not present

## 2019-01-21 DIAGNOSIS — M4802 Spinal stenosis, cervical region: Secondary | ICD-10-CM | POA: Diagnosis not present

## 2019-01-21 DIAGNOSIS — M7062 Trochanteric bursitis, left hip: Secondary | ICD-10-CM | POA: Diagnosis not present

## 2019-01-31 DIAGNOSIS — Z299 Encounter for prophylactic measures, unspecified: Secondary | ICD-10-CM | POA: Diagnosis not present

## 2019-01-31 DIAGNOSIS — M1990 Unspecified osteoarthritis, unspecified site: Secondary | ICD-10-CM | POA: Diagnosis not present

## 2019-01-31 DIAGNOSIS — I509 Heart failure, unspecified: Secondary | ICD-10-CM | POA: Diagnosis not present

## 2019-01-31 DIAGNOSIS — I10 Essential (primary) hypertension: Secondary | ICD-10-CM | POA: Diagnosis not present

## 2019-01-31 DIAGNOSIS — Z6829 Body mass index (BMI) 29.0-29.9, adult: Secondary | ICD-10-CM | POA: Diagnosis not present

## 2019-01-31 DIAGNOSIS — J449 Chronic obstructive pulmonary disease, unspecified: Secondary | ICD-10-CM | POA: Diagnosis not present

## 2019-02-01 DIAGNOSIS — M25539 Pain in unspecified wrist: Secondary | ICD-10-CM | POA: Diagnosis not present

## 2019-02-01 DIAGNOSIS — Z299 Encounter for prophylactic measures, unspecified: Secondary | ICD-10-CM | POA: Diagnosis not present

## 2019-02-01 DIAGNOSIS — M79643 Pain in unspecified hand: Secondary | ICD-10-CM | POA: Diagnosis not present

## 2019-02-01 DIAGNOSIS — I10 Essential (primary) hypertension: Secondary | ICD-10-CM | POA: Diagnosis not present

## 2019-02-01 DIAGNOSIS — M25569 Pain in unspecified knee: Secondary | ICD-10-CM | POA: Diagnosis not present

## 2019-02-01 DIAGNOSIS — Z6829 Body mass index (BMI) 29.0-29.9, adult: Secondary | ICD-10-CM | POA: Diagnosis not present

## 2019-02-01 DIAGNOSIS — M79671 Pain in right foot: Secondary | ICD-10-CM | POA: Diagnosis not present

## 2019-02-06 DIAGNOSIS — Z6827 Body mass index (BMI) 27.0-27.9, adult: Secondary | ICD-10-CM | POA: Diagnosis not present

## 2019-02-06 DIAGNOSIS — M461 Sacroiliitis, not elsewhere classified: Secondary | ICD-10-CM | POA: Diagnosis not present

## 2019-02-06 DIAGNOSIS — R030 Elevated blood-pressure reading, without diagnosis of hypertension: Secondary | ICD-10-CM | POA: Diagnosis not present

## 2019-02-15 DIAGNOSIS — Z299 Encounter for prophylactic measures, unspecified: Secondary | ICD-10-CM | POA: Diagnosis not present

## 2019-02-15 DIAGNOSIS — M79643 Pain in unspecified hand: Secondary | ICD-10-CM | POA: Diagnosis not present

## 2019-02-15 DIAGNOSIS — I10 Essential (primary) hypertension: Secondary | ICD-10-CM | POA: Diagnosis not present

## 2019-02-15 DIAGNOSIS — M79671 Pain in right foot: Secondary | ICD-10-CM | POA: Diagnosis not present

## 2019-02-15 DIAGNOSIS — M25539 Pain in unspecified wrist: Secondary | ICD-10-CM | POA: Diagnosis not present

## 2019-02-15 DIAGNOSIS — Z6829 Body mass index (BMI) 29.0-29.9, adult: Secondary | ICD-10-CM | POA: Diagnosis not present

## 2019-02-15 DIAGNOSIS — M069 Rheumatoid arthritis, unspecified: Secondary | ICD-10-CM | POA: Diagnosis not present

## 2019-02-26 DIAGNOSIS — M069 Rheumatoid arthritis, unspecified: Secondary | ICD-10-CM | POA: Diagnosis not present

## 2019-03-04 DIAGNOSIS — Z1331 Encounter for screening for depression: Secondary | ICD-10-CM | POA: Diagnosis not present

## 2019-03-04 DIAGNOSIS — Z6829 Body mass index (BMI) 29.0-29.9, adult: Secondary | ICD-10-CM | POA: Diagnosis not present

## 2019-03-04 DIAGNOSIS — E7800 Pure hypercholesterolemia, unspecified: Secondary | ICD-10-CM | POA: Diagnosis not present

## 2019-03-04 DIAGNOSIS — Z1339 Encounter for screening examination for other mental health and behavioral disorders: Secondary | ICD-10-CM | POA: Diagnosis not present

## 2019-03-04 DIAGNOSIS — Z1211 Encounter for screening for malignant neoplasm of colon: Secondary | ICD-10-CM | POA: Diagnosis not present

## 2019-03-04 DIAGNOSIS — Z0000 Encounter for general adult medical examination without abnormal findings: Secondary | ICD-10-CM | POA: Diagnosis not present

## 2019-03-04 DIAGNOSIS — Z299 Encounter for prophylactic measures, unspecified: Secondary | ICD-10-CM | POA: Diagnosis not present

## 2019-03-04 DIAGNOSIS — I10 Essential (primary) hypertension: Secondary | ICD-10-CM | POA: Diagnosis not present

## 2019-03-04 DIAGNOSIS — I509 Heart failure, unspecified: Secondary | ICD-10-CM | POA: Diagnosis not present

## 2019-03-04 DIAGNOSIS — Z7189 Other specified counseling: Secondary | ICD-10-CM | POA: Diagnosis not present

## 2019-03-08 DIAGNOSIS — Z125 Encounter for screening for malignant neoplasm of prostate: Secondary | ICD-10-CM | POA: Diagnosis not present

## 2019-03-08 DIAGNOSIS — E039 Hypothyroidism, unspecified: Secondary | ICD-10-CM | POA: Diagnosis not present

## 2019-03-08 DIAGNOSIS — E7800 Pure hypercholesterolemia, unspecified: Secondary | ICD-10-CM | POA: Diagnosis not present

## 2019-03-08 DIAGNOSIS — Z79899 Other long term (current) drug therapy: Secondary | ICD-10-CM | POA: Diagnosis not present

## 2019-03-08 DIAGNOSIS — Z0000 Encounter for general adult medical examination without abnormal findings: Secondary | ICD-10-CM | POA: Diagnosis not present

## 2019-03-12 DIAGNOSIS — M069 Rheumatoid arthritis, unspecified: Secondary | ICD-10-CM | POA: Diagnosis not present

## 2019-03-14 DIAGNOSIS — M069 Rheumatoid arthritis, unspecified: Secondary | ICD-10-CM | POA: Diagnosis not present

## 2019-03-15 DIAGNOSIS — Z713 Dietary counseling and surveillance: Secondary | ICD-10-CM | POA: Diagnosis not present

## 2019-03-15 DIAGNOSIS — Z6829 Body mass index (BMI) 29.0-29.9, adult: Secondary | ICD-10-CM | POA: Diagnosis not present

## 2019-03-15 DIAGNOSIS — Z299 Encounter for prophylactic measures, unspecified: Secondary | ICD-10-CM | POA: Diagnosis not present

## 2019-03-15 DIAGNOSIS — I10 Essential (primary) hypertension: Secondary | ICD-10-CM | POA: Diagnosis not present

## 2019-03-15 DIAGNOSIS — M069 Rheumatoid arthritis, unspecified: Secondary | ICD-10-CM | POA: Diagnosis not present

## 2019-03-15 DIAGNOSIS — Z87891 Personal history of nicotine dependence: Secondary | ICD-10-CM | POA: Diagnosis not present

## 2019-03-21 DIAGNOSIS — R69 Illness, unspecified: Secondary | ICD-10-CM | POA: Diagnosis not present

## 2019-05-07 DIAGNOSIS — M5412 Radiculopathy, cervical region: Secondary | ICD-10-CM | POA: Diagnosis not present

## 2019-05-21 DIAGNOSIS — I10 Essential (primary) hypertension: Secondary | ICD-10-CM | POA: Diagnosis not present

## 2019-05-21 DIAGNOSIS — I509 Heart failure, unspecified: Secondary | ICD-10-CM | POA: Diagnosis not present

## 2019-05-21 DIAGNOSIS — Z299 Encounter for prophylactic measures, unspecified: Secondary | ICD-10-CM | POA: Diagnosis not present

## 2019-05-21 DIAGNOSIS — J449 Chronic obstructive pulmonary disease, unspecified: Secondary | ICD-10-CM | POA: Diagnosis not present

## 2019-05-21 DIAGNOSIS — Z6828 Body mass index (BMI) 28.0-28.9, adult: Secondary | ICD-10-CM | POA: Diagnosis not present

## 2019-05-21 DIAGNOSIS — R109 Unspecified abdominal pain: Secondary | ICD-10-CM | POA: Diagnosis not present

## 2019-05-21 DIAGNOSIS — R69 Illness, unspecified: Secondary | ICD-10-CM | POA: Diagnosis not present

## 2019-05-23 DIAGNOSIS — R1030 Lower abdominal pain, unspecified: Secondary | ICD-10-CM | POA: Diagnosis not present

## 2019-05-23 DIAGNOSIS — I700 Atherosclerosis of aorta: Secondary | ICD-10-CM | POA: Diagnosis not present

## 2019-05-31 DIAGNOSIS — I10 Essential (primary) hypertension: Secondary | ICD-10-CM | POA: Diagnosis not present

## 2019-05-31 DIAGNOSIS — Z299 Encounter for prophylactic measures, unspecified: Secondary | ICD-10-CM | POA: Diagnosis not present

## 2019-05-31 DIAGNOSIS — Z6828 Body mass index (BMI) 28.0-28.9, adult: Secondary | ICD-10-CM | POA: Diagnosis not present

## 2019-05-31 DIAGNOSIS — I509 Heart failure, unspecified: Secondary | ICD-10-CM | POA: Diagnosis not present

## 2019-05-31 DIAGNOSIS — J449 Chronic obstructive pulmonary disease, unspecified: Secondary | ICD-10-CM | POA: Diagnosis not present

## 2019-06-10 DIAGNOSIS — M069 Rheumatoid arthritis, unspecified: Secondary | ICD-10-CM | POA: Diagnosis not present

## 2019-06-10 DIAGNOSIS — I10 Essential (primary) hypertension: Secondary | ICD-10-CM | POA: Diagnosis not present

## 2019-06-10 DIAGNOSIS — M79641 Pain in right hand: Secondary | ICD-10-CM | POA: Diagnosis not present

## 2019-06-10 DIAGNOSIS — Z299 Encounter for prophylactic measures, unspecified: Secondary | ICD-10-CM | POA: Diagnosis not present

## 2019-06-10 DIAGNOSIS — Z79899 Other long term (current) drug therapy: Secondary | ICD-10-CM | POA: Diagnosis not present

## 2019-06-10 DIAGNOSIS — I700 Atherosclerosis of aorta: Secondary | ICD-10-CM | POA: Diagnosis not present

## 2019-06-10 DIAGNOSIS — Z6829 Body mass index (BMI) 29.0-29.9, adult: Secondary | ICD-10-CM | POA: Diagnosis not present

## 2019-06-11 DIAGNOSIS — M069 Rheumatoid arthritis, unspecified: Secondary | ICD-10-CM | POA: Diagnosis not present

## 2019-06-12 DIAGNOSIS — Z6827 Body mass index (BMI) 27.0-27.9, adult: Secondary | ICD-10-CM | POA: Diagnosis not present

## 2019-06-12 DIAGNOSIS — M461 Sacroiliitis, not elsewhere classified: Secondary | ICD-10-CM | POA: Diagnosis not present

## 2019-07-12 DIAGNOSIS — J449 Chronic obstructive pulmonary disease, unspecified: Secondary | ICD-10-CM | POA: Diagnosis not present

## 2019-07-12 DIAGNOSIS — Z79899 Other long term (current) drug therapy: Secondary | ICD-10-CM | POA: Diagnosis not present

## 2019-07-12 DIAGNOSIS — M5412 Radiculopathy, cervical region: Secondary | ICD-10-CM | POA: Diagnosis not present

## 2019-07-12 DIAGNOSIS — R69 Illness, unspecified: Secondary | ICD-10-CM | POA: Diagnosis not present

## 2019-07-12 DIAGNOSIS — M7061 Trochanteric bursitis, right hip: Secondary | ICD-10-CM | POA: Diagnosis not present

## 2019-07-12 DIAGNOSIS — M961 Postlaminectomy syndrome, not elsewhere classified: Secondary | ICD-10-CM | POA: Diagnosis not present

## 2019-07-12 DIAGNOSIS — M461 Sacroiliitis, not elsewhere classified: Secondary | ICD-10-CM | POA: Diagnosis not present

## 2019-07-16 ENCOUNTER — Telehealth: Payer: Self-pay | Admitting: Cardiovascular Disease

## 2019-07-16 NOTE — Telephone Encounter (Signed)
Virtual Visit Pre-Appointment Phone Call  "(Name), I am calling you today to discuss your upcoming appointment. We are currently trying to limit exposure to the virus that causes COVID-19 by seeing patients at home rather than in the office."  "What is the BEST phone number to call the day of the visit?" -   [phone]  1. "Do you have or have access to (through a family member/friend) a smartphone with video capability that we can use for your visit?" a. If yes - list this number in appt notes as "cell" (if different from BEST phone #) and list the appointment type as a VIDEO visit in appointment notes b. If no - list the appointment type as a PHONE visit in appointment notes  2. Confirm consent - "In the setting of the current Covid19 crisis, you are scheduled for a (phone or video) visit with your provider on (date) at (time).  Just as we do with many in-office visits, in order for you to participate in this visit, we must obtain consent.  If you'd like, I can send this to your mychart (if signed up) or email for you to review.  Otherwise, I can obtain your verbal consent now.  All virtual visits are billed to your insurance company just like a normal visit would be.  By agreeing to a virtual visit, we'd like you to understand that the technology does not allow for your provider to perform an examination, and thus may limit your provider's ability to fully assess your condition. If your provider identifies any concerns that need to be evaluated in person, we will make arrangements to do so.  Finally, though the technology is pretty good, we cannot assure that it will always work on either your or our end, and in the setting of a video visit, we may have to convert it to a phone-only visit.  In either situation, we cannot ensure that we have a secure connection.  Are you willing to proceed?" STAFF: Did the patient verbally acknowledge consent to telehealth visit? Document YES/NO here: YES   3.   4. Advise patient to be prepared - "Two hours prior to your appointment, go ahead and check your blood pressure, pulse, oxygen saturation, and your weight (if you have the equipment to check those) and write them all down. When your visit starts, your provider will ask you for this information. If you have an Apple Watch or Kardia device, please plan to have heart rate information ready on the day of your appointment. Please have a pen and paper handy nearby the day of the visit as well."  5. Give patient instructions for MyChart download to smartphone OR [email or web address] as below if video visit (depending on what platform provider is using)  6. Inform patient they will receive a phone call 15 minutes prior to their appointment time (may be from unknown caller ID) so they should be prepared to answer    TELEPHONE CALL NOTE  Roy [name] has been deemed a candidate for a follow-up tele-health visit to limit community exposure during the Covid-19 pandemic. I spoke with the patient via phone to ensure availability of phone/video source, confirm preferred email & phone number, and discuss instructions and expectations.  I reminded Roy [name] to be prepared with any vital sign and/or heart rhythm information that could potentially be obtained via home monitoring, at the time of his visit. I reminded Roy [name] to expect a phone call prior to  his visit.  Chanda Busing 07/16/2019 9:01 AM   INSTRUCTIONS FOR DOWNLOADING THE MYCHART APP TO SMARTPHONE  - The patient must first make sure to have activated MyChart and know their login information - If Apple, go to [employer] and type in MyChart in the search bar and download the app. If Android, ask patient to go to [employer] and type in [place] in the search bar and download the app. The app is free but as with any other app downloads, their phone may require them to verify saved payment information or Apple/Android password.   - The patient will need to then log into the app with their MyChart username and password, and select [facility] as their healthcare provider to link the account. When it is time for your visit, go to the MyChart app, find appointments, and click Begin Video Visit. Be sure to Select Allow for your device to access the Microphone and Camera for your visit. You will then be connected, and your provider will be with you shortly.  **If they have any issues connecting, or need assistance please contact MyChart service desk [phone])**  **If using a computer, in order to ensure the best quality for their visit they will need to use either of the following Internet Browsers: [employer], or Google Chrome**  IF USING DOXIMITY or DOXY.ME - The patient will receive a link just prior to their visit by text.     FULL LENGTH CONSENT FOR TELE-HEALTH VISIT   I hereby voluntarily request, consent and authorize [facility] and its employed or contracted physicians, physician assistants, nurse practitioners or other licensed health care professionals (the Practitioner), to provide me with telemedicine health care services (the "Services") as deemed necessary by the treating Practitioner. I acknowledge and consent to receive the Services by the Practitioner via telemedicine. I understand that the telemedicine visit will involve communicating with the Practitioner through live audiovisual communication technology and the disclosure of certain medical information by electronic transmission. I acknowledge that I have been given the opportunity to request an in-person assessment or other available alternative prior to the telemedicine visit and am voluntarily participating in the telemedicine visit.  I understand that I have the right to withhold or withdraw my consent to the use of telemedicine in the course of my care at any time, without affecting my right to future care or treatment, and that  the Practitioner or I may terminate the telemedicine visit at any time. I understand that I have the right to inspect all information obtained and/or recorded in the course of the telemedicine visit and may receive copies of available information for a reasonable fee.  I understand that some of the potential risks of receiving the Services via telemedicine include:  [name] Delay or interruption in medical evaluation due to technological equipment failure or disruption; . Information transmitted may not be sufficient (e.g. poor resolution of images) to allow for appropriate medical decision making by the Practitioner; and/or  . In rare instances, security protocols could fail, causing a breach of personal health information.  Furthermore, I acknowledge that it is my responsibility to provide information about my medical history, conditions and care that is complete and accurate to the best of my ability. I acknowledge that Practitioner's advice, recommendations, and/or decision may be based on factors not within their control, such as incomplete or inaccurate data provided by me or distortions of diagnostic images or specimens that may result from electronic transmissions. I  understand that the practice of medicine is not an exact science and that Practitioner makes no warranties or guarantees regarding treatment outcomes. I acknowledge that I will receive a copy of this consent concurrently upon execution via email to the email address I last provided but may also request a printed copy by calling the office of [facility].    I understand that my insurance will be billed for this visit.   I have read or had this consent read to me. . I understand the contents of this consent, which adequately explains the benefits and risks of the Services being provided via telemedicine.  . I have been provided ample opportunity to ask questions regarding this consent and the Services and have had my questions answered to  my satisfaction. . I give my informed consent for the services to be provided through the use of telemedicine in my medical care  By participating in this telemedicine visit I agree to the above.

## 2019-07-17 ENCOUNTER — Encounter: Payer: Self-pay | Admitting: *Deleted

## 2019-07-17 ENCOUNTER — Encounter: Payer: Self-pay | Admitting: Cardiovascular Disease

## 2019-07-17 ENCOUNTER — Telehealth: Payer: Self-pay | Admitting: Cardiovascular Disease

## 2019-07-17 ENCOUNTER — Telehealth (INDEPENDENT_AMBULATORY_CARE_PROVIDER_SITE_OTHER): Payer: Medicare HMO | Admitting: Cardiovascular Disease

## 2019-07-17 VITALS — BP 130/85 | HR 72 | Ht 69.0 in | Wt 180.0 lb

## 2019-07-17 DIAGNOSIS — I25708 Atherosclerosis of coronary artery bypass graft(s), unspecified, with other forms of angina pectoris: Secondary | ICD-10-CM

## 2019-07-17 DIAGNOSIS — R0600 Dyspnea, unspecified: Secondary | ICD-10-CM | POA: Diagnosis not present

## 2019-07-17 DIAGNOSIS — R0609 Other forms of dyspnea: Secondary | ICD-10-CM

## 2019-07-17 DIAGNOSIS — E785 Hyperlipidemia, unspecified: Secondary | ICD-10-CM

## 2019-07-17 DIAGNOSIS — I10 Essential (primary) hypertension: Secondary | ICD-10-CM

## 2019-07-17 NOTE — Patient Instructions (Addendum)
Medication Instructions:   Your physician recommends that you continue on your current medications as directed. Please refer to the Current Medication list given to you today.  Labwork:  Your physician recommends that you return for lab work as soon as possible to check your CBC. This can be done at [facility]. Lab order faxed  Testing/Procedures:  A chest x-ray takes a picture of the organs and structures inside the chest, including the heart, lungs, and blood vessels. This test can show several things, including, whether the heart is enlarges; whether fluid is building up in the lungs; and whether pacemaker / defibrillator leads are still in place.-order faxed. Your physician has requested that you have a lexiscan myoview. For further information please visit [email or web address]. Please follow instruction sheet, as given.  Follow-Up:  Your physician recommends that you schedule a follow-up appointment in: 6 weeks (virtual).  Any Other Special Instructions Will Be Listed Below (If Applicable).  If you need a refill on your cardiac medications before your next appointment, please call your pharmacy.

## 2019-07-17 NOTE — Progress Notes (Signed)
Virtual Visit via Telephone Note   This visit type was conducted due to national recommendations for restrictions regarding the COVID-19 Pandemic (e.g. social distancing) in an effort to limit this patient's exposure and mitigate transmission in our community.  Due to his co-morbid illnesses, this patient is at least at moderate risk for complications without adequate follow up.  This format is felt to be most appropriate for this patient at this time.  The patient did not have access to video technology/had technical difficulties with video requiring transitioning to audio format only (telephone).  All issues noted in this document were discussed and addressed.  No physical exam could be performed with this format.  Please refer to the patient's chart for his  consent to telehealth for [facility]   Date:  07/17/2019   ID:  Schaun, Decarli [birth year], MRN [id number]  Patient Location: Home Provider Location: Office  PCP:  Monico Blitz, MD  Cardiologist:  Kate Sable, MD Electrophysiologist:  None   Evaluation Performed:  Follow-Up Visit  Chief Complaint:  CAD  History of Present Illness:    XZADRIAN MILHOAN is a 64 y.o. male with a h/o CAD. He underwent 4-vessel CABG on 07/24/18 with LIMA-LAD, SVG-PDA, and Seq-SVG-OM-RI.  He has been experiencing shortness of breath when walking to his car and back to his house over the past 2 months. He denies chest pain and leg swelling. He has occasional palpitations.  He denies hematochezia and melena.  He has been receiving injections for his back and holds aspirin for several days prior to these.   Past Medical History:  Diagnosis Date  . Abdominal pain, right upper quadrant   . Arthritis    "all my joints" (11/28/2013)  . Blind left eye   . CAD (coronary artery disease)    a. Prior BMS to OM and RCA, subsequent angioplasty due to ISR in these distributions, also BMS placement with DES to LAD. b. Cath 01/2013: diffuse 3V CAD  without clear culprit lesion, normal LVEF, mid LAD lesion noted, treat medically. c. Cath 11/28/2013 DES to mid LAD lesion with abnormal FFR, DES to distal LAD lesion, DES to prox RCA d. 07/24/2018:CABG x4 with LIMA-LAD, SVG-PDA, and Seq-SVG-OM-RI.  [name] Chronic lower back pain   . COPD (chronic obstructive pulmonary disease) ([place])   . DDD (degenerative disc disease)   . Depression   . GERD (gastroesophageal reflux disease)   . Hepatitis C C   "had the tx and I'm free of it"  . History of blood transfusion    "when I was taking tx for hepatitis"  . History of kidney stones   . Hyperlipidemia   . Hypertension   . Hypothyroidism   . Kidney stones    "no surgeries"  . Macular degeneration of both eyes    "already blind in my left eye"  . Myocardial infarction ([place]) ~ 2007   . Other and unspecified angina pectoris   . Scoliosis   . Sinus bradycardia    Past Surgical History:  Procedure Laterality Date  . BACK SURGERY     x3 for scoliosis  . CARDIAC CATHETERIZATION     "I've had a few without intervention"  . COLONOSCOPY    . COLONOSCOPY N/A 05/02/2013   Procedure: COLONOSCOPY;  Surgeon: Rogene Houston, MD;  [facility];  Service: Endoscopy;  Laterality: N/A;  1030-moved to 1055 Ann to notify pt  . COLONOSCOPY N/A 07/06/2017   Procedure: COLONOSCOPY;  Surgeon:  Rogene Houston, MD;  [facility];  Service: Endoscopy;  Laterality: N/A;  12:45  . CORONARY ANGIOPLASTY WITH STENT PLACEMENT   2007; 2008; 11/28/2013   "2 + 2; + 3"  . CORONARY ARTERY BYPASS GRAFT N/A 07/24/2018   Procedure: CORONARY ARTERY BYPASS GRAFTING (CABG);  Surgeon: Gaye Pollack, MD;  Location: [place];  Service: Open Heart Surgery;  Laterality: N/A;  Times 4 using left internal mammary artery and endoscopically harvested right saphenous vein  . INGUINAL HERNIA REPAIR Bilateral ~ 2000  . LEFT HEART CATH AND CORONARY ANGIOGRAPHY N/A 07/12/2018   Procedure: LEFT HEART CATH AND CORONARY ANGIOGRAPHY;   Surgeon: Jettie Booze, MD;  Location: [place] [facility];  Service: Cardiovascular;  Laterality: N/A;  . LEFT HEART CATHETERIZATION WITH CORONARY ANGIOGRAM N/A 01/30/2013   Procedure: LEFT HEART CATHETERIZATION WITH CORONARY ANGIOGRAM;  Surgeon: Jolaine Artist, MD;  Location: [facility] C[facility];  Service: Cardiovascular;  Laterality: N/A;  . LEFT HEART CATHETERIZATION WITH CORONARY ANGIOGRAM N/A 11/28/2013   Procedure: LEFT HEART CATHETERIZATION WITH CORONARY ANGIOGRAM;  Surgeon: Jettie Booze, MD;  Location: [facility] CATH LAB;  Service: Cardiovascular;  Laterality: N/A;  . LIVER BIOPSY  03/24/2010  . POLYPECTOMY  07/06/2017   Procedure: POLYPECTOMY;  Surgeon: Rogene Houston, MD;  [facility];  Service: Endoscopy;;  colon  . TEE WITHOUT CARDIOVERSION N/A 07/24/2018   Procedure: TRANSESOPHAGEAL ECHOCARDIOGRAM (TEE);  Surgeon: Gaye Pollack, MD;  Location: [place];  Service: Open Heart Surgery;  Laterality: N/A;  . TOOTH EXTRACTION       Current Meds  Medication Sig  . amLODipine (NORVASC) 10 MG tablet Take 10 mg by mouth daily.  [name] aspirin EC 325 MG EC tablet Take 1 tablet (325 mg total) by mouth daily.  . carvedilol (COREG) 6.25 MG tablet Take 6.25 mg by mouth 2 (two) times daily with a meal.  . cyclobenzaprine (FLEXERIL) 10 MG tablet Take 10 mg by mouth at bedtime.   [name] escitalopram (LEXAPRO) 20 MG tablet Take 20 mg by mouth daily.   . folic acid (FOLVITE) 1 MG tablet Take 1 mg by mouth 2 (two) times daily.  . furosemide (LASIX) 20 MG tablet Take 1 tablet (20 mg total) by mouth daily as needed. For weight gain of 3-5 lbs (Patient taking differently: Take 20 mg by mouth daily. For weight gain of 3-5 lbs)  . gabapentin (NEURONTIN) 300 MG capsule Take 600 mg by mouth 3 (three) times daily.   [name] levothyroxine (SYNTHROID, LEVOTHROID) 75 MCG tablet Take 75 mcg by mouth daily before breakfast.  . lisinopril (ZESTRIL) 5 MG tablet Take 5 mg by mouth every other day.  . metolazone  (ZAROXOLYN) 2.5 MG tablet Take 20 mg by mouth once a week. (8 tablets on Friday)  . nitroGLYCERIN (NITROSTAT) 0.4 MG SL tablet Place 1 tablet (0.4 mg total) under the tongue every 5 (five) minutes x 3 doses as needed. (Patient taking differently: Place 0.4 mg under the tongue every 5 (five) minutes x 3 doses as needed for chest pain. )  . oxyCODONE-acetaminophen (PERCOCET) 10-325 MG tablet Take 1 tablet by mouth every 6 (six) hours as needed for pain.  . potassium chloride (K-DUR) 10 MEQ tablet Take 1 tablet (10 mEq total) by mouth daily as needed. On days you take Lasix (Patient taking differently: Take 10 mEq by mouth daily. On days you take Lasix)  . rosuvastatin (CRESTOR) 20 MG tablet Take 1 tablet (20 mg total)  by mouth daily.  . tamsulosin (FLOMAX) 0.4 MG CAPS capsule Take 0.4 mg by mouth daily.  . traZODone (DESYREL) 100 MG tablet Take 100 mg by mouth at bedtime.     Allergies:   Penicillins, Imdur [isosorbide nitrate], Codeine, and Food   Social History   Tobacco Use  . Smoking status: Former Smoker    Packs/day: 1.50    Years: 35.00    Pack years: 52.50    Types: Cigarettes    Start date: 07/20/1971    Quit date: 02/27/2006    Years since quitting: 13.3  . Smokeless tobacco: Never Used  Substance Use Topics  . Alcohol use: No    Alcohol/week: 0.0 standard drinks  . Drug use: No    Comment: 11/28/2013 "did qthing in my teens and 20's; none since"     Family Hx: The patient's family history includes Diabetes in his brother, brother, and mother; Healthy in his brother, son, and son; Heart disease in his brother.  ROS:   Please see the history of present illness.     All other systems reviewed and are negative.   Prior CV studies:   The following studies were reviewed today:  Cardiac Catheterization: 07/12/2018  Previously placed Prox RCA to Mid RCA stent (unknown type) is widely patent.  Mid LM to Dist LM lesion is 40% stenosed.  Mid RCA lesion is 10%  stenosed.  Ost Ramus to Ramus lesion is 95% stenosed.  Ost 1st Mrg to 1st Mrg lesion is 95% stenosed.  Ost LAD lesion is 50% stenosed.  Previously placed Prox LAD stent (unknown type) is widely patent.  Ost 1st Diag to 1st Diag lesion is 99% stenosed.  Ost 2nd Diag to 2nd Diag lesion is 70% stenosed.  Mid LAD-1 lesion is 75% stenosed.  Previously placed Mid LAD-2 stent (unknown type) is widely patent.  Dist RCA lesion is 25% stenosed.  Dist Cx lesion is 90% stenosed.  The left ventricular systolic function is normal.  LV end diastolic pressure is normal.  The left ventricular ejection fraction is 55-65% by visual estimate.  There is no aortic valve stenosis.  Severe multivessel CAD. Multiple branches involved and moderate distal left main and ostial LAD disease as well. Severe mid LAD disease. CTO of large diagonal branch.   When compared to the 2015 cath, there has been significant progression of the mid LAD disease, branch vessel disease and mild progression of the distal left main disease.  Plan for Cardiac surgery consult for CABG.  TEE: 07/24/2018  Aortic valve: The valve is trileaflet. No stenosis. No regurgitation.  Right ventricle: Normal cavity size, wall thickness and ejection fraction.  Mitral valve: Trace regurgitation.  Labs/Other Tests and Data Reviewed:    EKG:  No ECG reviewed.  Recent Labs: 07/20/2018: ALT 14 07/25/2018: Magnesium 2.4 07/29/2018: BUN 18; Creatinine, Ser 1.03; Hemoglobin 11.0; Platelets 247; Potassium 3.7; Sodium 135   Recent Lipid Panel Lab Results  Component Value Date/Time   CHOL 208 (H) 04/03/2013 08:26 AM   TRIG 156 (H) 04/03/2013 08:26 AM   HDL 34 (L) 04/03/2013 08:26 AM   CHOLHDL 6.1 04/03/2013 08:26 AM   LDLCALC 143 (H) 04/03/2013 08:26 AM    Wt Readings from Last 3 Encounters:  07/17/19 180 lb (81.6 kg)  11/05/18 180 lb (81.6 kg)  08/22/18 178 lb (80.7 kg)     Objective:    Vital Signs:  BP 130/85    Pulse 72   Ht 5\' 9"  (1.753 m)  Wt 180 lb (81.6 kg)   BMI 26.58 kg/m    VITAL SIGNS:  reviewed  ASSESSMENT & PLAN:    1. CAD: S/p 4-vessel CABG in January 2020.  He has been experiencing shortness of breath over the past 2 months.  I will obtain a Lexiscan Myoview to evaluate for any significant ischemic territories. Continue ASA (I will reduce to 81 mg daily), beta blocker, and statin.  2. Hypertension: Blood pressure is normal.  No changes to therapy.  3. Hyperlipidemia: Continue statin.  4. Shortness of breath: I will obtain a CBC to evaluate for anemia.  I will obtain a chest x-ray.  I will also obtain a Lexiscan Myoview to evaluate for any significant ischemic territories.    COVID-19 Education: The signs and symptoms of COVID-19 were discussed with the patient and how to seek care for testing (follow up with PCP or arrange E-visit).  The importance of social distancing was discussed today.  Time:   Today, I have spent 25 minutes with the patient with telehealth technology discussing the above problems.     Medication Adjustments/Labs and Tests Ordered: Current medicines are reviewed at length with the patient today.  Concerns regarding medicines are outlined above.   Tests Ordered: No orders of the defined types were placed in this encounter.   Medication Changes: No orders of the defined types were placed in this encounter.   Follow Up:  Virtual Visit  in 6 week(s)  Signed, Kate Sable, MD  07/17/2019 11:03 AM    [place]

## 2019-07-17 NOTE — Addendum Note (Signed)
Addended by: Merlene Laughter on: 07/17/2019 11:21 AM   Modules accepted: Orders

## 2019-07-17 NOTE — Telephone Encounter (Signed)
  Precert needed for:  Lexiscan  Location: [facility]     Date: Jul 29, 2019

## 2019-07-18 ENCOUNTER — Ambulatory Visit (HOSPITAL_COMMUNITY)
Admission: RE | Admit: 2019-07-18 | Discharge: 2019-07-18 | Disposition: A | Discharge status: Home / Self Care | Payer: Medicare HMO | Source: Ambulatory Visit | Attending: Cardiovascular Disease | Admitting: Cardiovascular Disease

## 2019-07-18 ENCOUNTER — Other Ambulatory Visit: Payer: Self-pay

## 2019-07-18 ENCOUNTER — Other Ambulatory Visit (HOSPITAL_COMMUNITY)
Admission: RE | Admit: 2019-07-18 | Discharge: 2019-07-18 | Disposition: A | Discharge status: Home / Self Care | Payer: Medicare HMO | Source: Ambulatory Visit | Attending: Cardiovascular Disease | Admitting: Cardiovascular Disease

## 2019-07-18 DIAGNOSIS — R0602 Shortness of breath: Secondary | ICD-10-CM | POA: Diagnosis not present

## 2019-07-18 DIAGNOSIS — R0600 Dyspnea, unspecified: Secondary | ICD-10-CM | POA: Diagnosis not present

## 2019-07-18 LAB — CBC
HCT: 36.7 % — ABNORMAL LOW (ref 39.0–52.0)
Hemoglobin: 12 g/dL — ABNORMAL LOW (ref 13.0–17.0)
MCH: 32 pg (ref 26.0–34.0)
MCHC: 32.7 g/dL (ref 30.0–36.0)
MCV: 97.9 fL (ref 80.0–100.0)
Platelets: 353 10*3/uL (ref 150–400)
RBC: 3.75 MIL/uL — ABNORMAL LOW (ref 4.22–5.81)
RDW: 14.8 % (ref 11.5–15.5)
WBC: 6.4 10*3/uL (ref 4.0–10.5)
nRBC: 0 % (ref 0.0–0.2)

## 2019-07-19 ENCOUNTER — Telehealth: Payer: Self-pay | Admitting: *Deleted

## 2019-07-19 NOTE — Telephone Encounter (Signed)
-----   Message from Herminio Commons, MD sent at 07/19/2019  8:29 AM EST ----- Only mildly anemic.

## 2019-07-19 NOTE — Telephone Encounter (Signed)
Patient informed. Copy sent to PCP °

## 2019-07-25 ENCOUNTER — Telehealth: Payer: Self-pay | Admitting: *Deleted

## 2019-07-25 NOTE — Telephone Encounter (Signed)
Laurine Blazer, [place]  [phone] [id number] PM EST    Patient notified. Copy to pmd. States he will call his pmd today. Will be doing stress test on 07/29/2019

## 2019-07-25 NOTE — Telephone Encounter (Signed)
Roy [name], [place]  [phone] PM EST    Busy signal on home number. Left message to return call on mobile number.    Roy [name], [place]  [id number] AM EST    Busy

## 2019-07-25 NOTE — Telephone Encounter (Signed)
-----   Message from Herminio Commons, MD sent at 07/18/2019 10:53 AM EST ----- No CHF. Possibly inflammation in lungs. Forward copy to PCP and encourage him to follow up with him as well.

## 2019-07-29 ENCOUNTER — Other Ambulatory Visit: Payer: Self-pay

## 2019-07-29 ENCOUNTER — Encounter (HOSPITAL_COMMUNITY)
Admission: RE | Admit: 2019-07-29 | Discharge: 2019-07-29 | Disposition: A | Discharge status: Home / Self Care | Payer: Medicare HMO | Source: Ambulatory Visit | Attending: Cardiovascular Disease | Admitting: Cardiovascular Disease

## 2019-07-29 ENCOUNTER — Encounter (HOSPITAL_BASED_OUTPATIENT_CLINIC_OR_DEPARTMENT_OTHER)
Admission: RE | Admit: 2019-07-29 | Discharge: 2019-07-29 | Disposition: A | Discharge status: Home / Self Care | Payer: Medicare HMO | Source: Ambulatory Visit | Attending: Cardiovascular Disease | Admitting: Cardiovascular Disease

## 2019-07-29 DIAGNOSIS — I25708 Atherosclerosis of coronary artery bypass graft(s), unspecified, with other forms of angina pectoris: Secondary | ICD-10-CM | POA: Insufficient documentation

## 2019-07-29 DIAGNOSIS — R0600 Dyspnea, unspecified: Secondary | ICD-10-CM

## 2019-07-29 DIAGNOSIS — R0609 Other forms of dyspnea: Secondary | ICD-10-CM

## 2019-07-29 LAB — NM MYOCAR MULTI W/SPECT W/WALL MOTION / EF
LV dias vol: 80 mL (ref 62–150)
LV sys vol: 34 mL
Peak HR: 96 {beats}/min
RATE: 0.39
Rest HR: 62 {beats}/min
SDS: 1
SRS: 3
SSS: 4
TID: 1.24

## 2019-07-29 MED ORDER — TECHNETIUM TC 99M TETROFOSMIN IV KIT
30.0000 | PACK | Freq: Once | INTRAVENOUS | Status: AC | PRN
  Administered 2019-07-29: 11:00:00 31.6 via INTRAVENOUS

## 2019-07-29 MED ORDER — SODIUM CHLORIDE FLUSH 0.9 % IV SOLN
INTRAVENOUS | Status: AC
  Administered 2019-07-29: 11:00:00 10 mL via INTRAVENOUS
  Filled 2019-07-29: qty 10

## 2019-07-29 MED ORDER — TECHNETIUM TC 99M TETROFOSMIN IV KIT
10.0000 | PACK | Freq: Once | INTRAVENOUS | Status: AC | PRN
  Administered 2019-07-29: 10:00:00 10.22 via INTRAVENOUS

## 2019-07-29 MED ORDER — REGADENOSON 0.4 MG/5ML IV SOLN
INTRAVENOUS | Status: AC
  Administered 2019-07-29: 11:00:00 0.4 mg via INTRAVENOUS
  Filled 2019-07-29: qty 5

## 2019-07-30 ENCOUNTER — Telehealth: Payer: Self-pay | Admitting: *Deleted

## 2019-07-30 DIAGNOSIS — Z299 Encounter for prophylactic measures, unspecified: Secondary | ICD-10-CM | POA: Diagnosis not present

## 2019-07-30 DIAGNOSIS — Z87891 Personal history of nicotine dependence: Secondary | ICD-10-CM | POA: Diagnosis not present

## 2019-07-30 DIAGNOSIS — D849 Immunodeficiency, unspecified: Secondary | ICD-10-CM | POA: Diagnosis not present

## 2019-07-30 DIAGNOSIS — I10 Essential (primary) hypertension: Secondary | ICD-10-CM | POA: Diagnosis not present

## 2019-07-30 DIAGNOSIS — J449 Chronic obstructive pulmonary disease, unspecified: Secondary | ICD-10-CM | POA: Diagnosis not present

## 2019-07-30 DIAGNOSIS — M069 Rheumatoid arthritis, unspecified: Secondary | ICD-10-CM | POA: Diagnosis not present

## 2019-07-30 DIAGNOSIS — Z6829 Body mass index (BMI) 29.0-29.9, adult: Secondary | ICD-10-CM | POA: Diagnosis not present

## 2019-07-30 NOTE — Telephone Encounter (Signed)
Roy [name], [place]  [id number] PM EST    Left message to return call.

## 2019-07-30 NOTE — Telephone Encounter (Signed)
-----   Message from Bernita Raisin, RN sent at 07/30/2019  3:01 PM EST -----  ----- Message ----- From: Herminio Commons, MD Sent: 07/30/2019  12:36 PM EST To: Bernita Raisin, RN  There is a possibility of blockage.  Currently on good medical therapy.  I will add Imdur 30 mg daily to optimize medical therapy.  If I am unable to improve symptoms with guideline directed medical therapy, I would then arrange for cardiac catheterization.  I will discuss in greater detail at his next virtual visit.

## 2019-07-31 MED ORDER — ISOSORBIDE MONONITRATE ER 30 MG PO TB24: 30.0000 mg | ORAL_TABLET | Freq: Every day | ORAL | 1 refills | Status: DC

## 2019-07-31 NOTE — Telephone Encounter (Signed)
Pt voiced understanding - Imdur sent to pharmacy

## 2019-08-02 ENCOUNTER — Other Ambulatory Visit: Payer: Self-pay | Admitting: Student

## 2019-08-14 DIAGNOSIS — R0602 Shortness of breath: Secondary | ICD-10-CM | POA: Diagnosis not present

## 2019-08-20 DIAGNOSIS — Z87891 Personal history of nicotine dependence: Secondary | ICD-10-CM | POA: Diagnosis not present

## 2019-08-20 DIAGNOSIS — R0602 Shortness of breath: Secondary | ICD-10-CM | POA: Diagnosis not present

## 2019-08-20 DIAGNOSIS — I509 Heart failure, unspecified: Secondary | ICD-10-CM | POA: Diagnosis not present

## 2019-08-20 DIAGNOSIS — I10 Essential (primary) hypertension: Secondary | ICD-10-CM | POA: Diagnosis not present

## 2019-08-20 DIAGNOSIS — I209 Angina pectoris, unspecified: Secondary | ICD-10-CM | POA: Diagnosis not present

## 2019-08-20 DIAGNOSIS — Z6829 Body mass index (BMI) 29.0-29.9, adult: Secondary | ICD-10-CM | POA: Diagnosis not present

## 2019-08-20 DIAGNOSIS — Z299 Encounter for prophylactic measures, unspecified: Secondary | ICD-10-CM | POA: Diagnosis not present

## 2019-09-02 ENCOUNTER — Telehealth (INDEPENDENT_AMBULATORY_CARE_PROVIDER_SITE_OTHER): Payer: Medicare HMO | Admitting: Cardiovascular Disease

## 2019-09-02 ENCOUNTER — Encounter: Payer: Self-pay | Admitting: Cardiovascular Disease

## 2019-09-02 VITALS — Ht 69.0 in | Wt 185.0 lb

## 2019-09-02 DIAGNOSIS — I10 Essential (primary) hypertension: Secondary | ICD-10-CM

## 2019-09-02 DIAGNOSIS — I25708 Atherosclerosis of coronary artery bypass graft(s), unspecified, with other forms of angina pectoris: Secondary | ICD-10-CM

## 2019-09-02 DIAGNOSIS — E785 Hyperlipidemia, unspecified: Secondary | ICD-10-CM

## 2019-09-02 MED ORDER — ASPIRIN EC 81 MG PO TBEC: 81.0000 mg | DELAYED_RELEASE_TABLET | Freq: Every day | ORAL | Status: DC

## 2019-09-02 NOTE — Progress Notes (Signed)
Virtual Visit via Telephone Note   This visit type was conducted due to national recommendations for restrictions regarding the COVID-19 Pandemic (e.g. social distancing) in an effort to limit this patient's exposure and mitigate transmission in our community.  Due to his co-morbid illnesses, this patient is at least at moderate risk for complications without adequate follow up.  This format is felt to be most appropriate for this patient at this time.  The patient did not have access to video technology/had technical difficulties with video requiring transitioning to audio format only (telephone).  All issues noted in this document were discussed and addressed.  No physical exam could be performed with this format.  Please refer to the patient's chart for his  consent to telehealth for [facility]   Date:  09/02/2019   ID:  Roy, [name] 14-Nov-1955, MRN [id number]  Patient Location: Home Provider Location: Office  PCP:  Monico Blitz, MD  Cardiologist:  Kate Sable, MD  Electrophysiologist:  None   Evaluation Performed:  Follow-Up Visit  Chief Complaint: Shortness of breath  History of Present Illness:    Roy [name] is a 64 y.o. male with shortness of breath.  I last evaluated him on 07/17/2019. He underwent 4-vessel CABG on 07/24/18 withLIMA-LAD, SVG-PDA, and Seq-SVG-OM-RI.  Hemoglobin 12 on 07/18/2019.  Chest x-ray showed diffuse mild bilateral interstitial prominence.  Pneumonitis cannot be excluded.  There was no pulmonary venous congestion.  Nuclear stress test showed reversible defect in the apical to basal inferior wall suggestive of ischemia, EF 57%.  It was an intermediate risk study.  I added Imdur 30 mg daily.  After he began taking it symptoms of shortness of breath resolved.  He then started feeling lightheaded and felt like he was going to pass out when he would stand up.  He ran out of Imdur about a week ago and the symptoms have also resolved.  He has  not had any recurrence of shortness of breath.  He also denies chest pain.     Past Medical History:  Diagnosis Date  . Abdominal pain, right upper quadrant   . Arthritis    "all my joints" (11/28/2013)  . Blind left eye   . CAD (coronary artery disease)    a. Prior BMS to OM and RCA, subsequent angioplasty due to ISR in these distributions, also BMS placement with DES to LAD. b. Cath 01/2013: diffuse 3V CAD without clear culprit lesion, normal LVEF, mid LAD lesion noted, treat medically. c. Cath 11/28/2013 DES to mid LAD lesion with abnormal FFR, DES to distal LAD lesion, DES to prox RCA d. 07/24/2018:CABG x4 with LIMA-LAD, SVG-PDA, and Seq-SVG-OM-RI.  [name] Chronic lower back pain   . COPD (chronic obstructive pulmonary disease) ([place])   . DDD (degenerative disc disease)   . Depression   . GERD (gastroesophageal reflux disease)   . Hepatitis C C   "had the tx and I'm free of it"  . History of blood transfusion    "when I was taking tx for hepatitis"  . History of kidney stones   . Hyperlipidemia   . Hypertension   . Hypothyroidism   . Kidney stones    "no surgeries"  . Macular degeneration of both eyes    "already blind in my left eye"  . Myocardial infarction ([place]) ~ 2007   . Other and unspecified angina pectoris   . Scoliosis   . Sinus bradycardia    Past Surgical History:  Procedure Laterality  Date  . BACK SURGERY     x3 for scoliosis  . CARDIAC CATHETERIZATION     "I've had a few without intervention"  . COLONOSCOPY    . COLONOSCOPY N/A 05/02/2013   Procedure: COLONOSCOPY;  Surgeon: Rogene Houston, MD;  [facility];  Service: Endoscopy;  Laterality: N/A;  1030-moved to 1055 Ann to notify pt  . COLONOSCOPY N/A 07/06/2017   Procedure: COLONOSCOPY;  Surgeon: Rogene Houston, MD;  [facility];  Service: Endoscopy;  Laterality: N/A;  12:45  . CORONARY ANGIOPLASTY WITH STENT PLACEMENT   2007; 2008; 11/28/2013   "2 + 2; + 3"  . CORONARY ARTERY BYPASS  GRAFT N/A 07/24/2018   Procedure: CORONARY ARTERY BYPASS GRAFTING (CABG);  Surgeon: Gaye Pollack, MD;  Location: [place];  Service: Open Heart Surgery;  Laterality: N/A;  Times 4 using left internal mammary artery and endoscopically harvested right saphenous vein  . INGUINAL HERNIA REPAIR Bilateral ~ 2000  . LEFT HEART CATH AND CORONARY ANGIOGRAPHY N/A 07/12/2018   Procedure: LEFT HEART CATH AND CORONARY ANGIOGRAPHY;  Surgeon: Jettie Booze, MD;  Location: [place] [facility];  Service: Cardiovascular;  Laterality: N/A;  . LEFT HEART CATHETERIZATION WITH CORONARY ANGIOGRAM N/A 01/30/2013   Procedure: LEFT HEART CATHETERIZATION WITH CORONARY ANGIOGRAM;  Surgeon: Jolaine Artist, MD;  Location: [facility] CATH LAB;  Service: Cardiovascular;  Laterality: N/A;  . LEFT HEART CATHETERIZATION WITH CORONARY ANGIOGRAM N/A 11/28/2013   Procedure: LEFT HEART CATHETERIZATION WITH CORONARY ANGIOGRAM;  Surgeon: Jettie Booze, MD;  Location: [facility] CATH LAB;  Service: Cardiovascular;  Laterality: N/A;  . LIVER BIOPSY  03/24/2010  . POLYPECTOMY  07/06/2017   Procedure: POLYPECTOMY;  Surgeon: Rogene Houston, MD;  [facility];  Service: Endoscopy;;  colon  . TEE WITHOUT CARDIOVERSION N/A 07/24/2018   Procedure: TRANSESOPHAGEAL ECHOCARDIOGRAM (TEE);  Surgeon: Gaye Pollack, MD;  Location: [place];  Service: Open Heart Surgery;  Laterality: N/A;  . TOOTH EXTRACTION       Current Meds  Medication Sig  . amLODipine (NORVASC) 10 MG tablet Take 10 mg by mouth daily.  [name] aspirin EC 325 MG EC tablet Take 1 tablet (325 mg total) by mouth daily.  . carvedilol (COREG) 6.25 MG tablet Take 6.25 mg by mouth 2 (two) times daily with a meal.  . cyclobenzaprine (FLEXERIL) 10 MG tablet Take 10 mg by mouth at bedtime.   [name] escitalopram (LEXAPRO) 20 MG tablet Take 20 mg by mouth daily.   . folic acid (FOLVITE) 1 MG tablet Take 1 mg by mouth 2 (two) times daily.  . furosemide (LASIX) 20 MG tablet Take 1 tablet (20 mg  total) by mouth daily as needed. For weight gain of 3-5 lbs (Patient taking differently: Take 20 mg by mouth daily. For weight gain of 3-5 lbs)  . gabapentin (NEURONTIN) 300 MG capsule Take 600 mg by mouth 3 (three) times daily.   . isosorbide mononitrate (IMDUR) 30 MG 24 hr tablet Take 1 tablet (30 mg total) by mouth daily.  [name] levothyroxine (SYNTHROID, LEVOTHROID) 75 MCG tablet Take 75 mcg by mouth daily before breakfast.  . lisinopril (ZESTRIL) 5 MG tablet Take 5 mg by mouth every other day.  . methotrexate 2.5 MG tablet Take 20 mg by mouth once a week. Caution:Chemotherapy. Protect from light.  . nitroGLYCERIN (NITROSTAT) 0.4 MG SL tablet Place 1 tablet (0.4 mg total) under the tongue every 5 (five) minutes x 3 doses as  needed. (Patient taking differently: Place 0.4 mg under the tongue every 5 (five) minutes x 3 doses as needed for chest pain. )  . oxyCODONE-acetaminophen (PERCOCET) 10-325 MG tablet Take 1 tablet by mouth every 6 (six) hours as needed for pain.  . potassium chloride (K-DUR) 10 MEQ tablet Take 1 tablet (10 mEq total) by mouth daily as needed. On days you take Lasix (Patient taking differently: Take 10 mEq by mouth daily. On days you take Lasix)  . rosuvastatin (CRESTOR) 20 MG tablet TAKE (1) TABLET BY MOUTH ONCE DAILY.  . tamsulosin (FLOMAX) 0.4 MG CAPS capsule Take 0.4 mg by mouth daily.  . traZODone (DESYREL) 100 MG tablet Take 100 mg by mouth at bedtime.     Allergies:   Penicillins, Imdur [isosorbide nitrate], Codeine, and Food   Social History   Tobacco Use  . Smoking status: Former Smoker    Packs/day: 1.50    Years: 35.00    Pack years: 52.50    Types: Cigarettes    Start date: 07/20/1971    Quit date: 02/27/2006    Years since quitting: 13.5  . Smokeless tobacco: Never Used  Substance Use Topics  . Alcohol use: No    Alcohol/week: 0.0 standard drinks  . Drug use: No    Comment: 11/28/2013 "did qthing in my teens and 20's; none since"     Family Hx: The  patient's family history includes Diabetes in his brother, brother, and mother; Healthy in his brother, son, and son; Heart disease in his brother.  ROS:   Please see the history of present illness.     All other systems reviewed and are negative.   Prior CV studies:   The following studies were reviewed today:   Cardiac Catheterization: 07/12/2018  Previously placed Prox RCA to Mid RCA stent (unknown type) is widely patent.  Mid LM to Dist LM lesion is 40% stenosed.  Mid RCA lesion is 10% stenosed.  Ost Ramus to Ramus lesion is 95% stenosed.  Ost 1st Mrg to 1st Mrg lesion is 95% stenosed.  Ost LAD lesion is 50% stenosed.  Previously placed Prox LAD stent (unknown type) is widely patent.  Ost 1st Diag to 1st Diag lesion is 99% stenosed.  Ost 2nd Diag to 2nd Diag lesion is 70% stenosed.  Mid LAD-1 lesion is 75% stenosed.  Previously placed Mid LAD-2 stent (unknown type) is widely patent.  Dist RCA lesion is 25% stenosed.  Dist Cx lesion is 90% stenosed.  The left ventricular systolic function is normal.  LV end diastolic pressure is normal.  The left ventricular ejection fraction is 55-65% by visual estimate.  There is no aortic valve stenosis.  Severe multivessel CAD. Multiple branches involved and moderate distal left main and ostial LAD disease as well. Severe mid LAD disease. CTO of large diagonal branch.   When compared to the 2015 cath, there has been significant progression of the mid LAD disease, branch vessel disease and mild progression of the distal left main disease.  Plan for Cardiac surgery consult for CABG.  TEE: 07/24/2018  Aortic valve: The valve is trileaflet. No stenosis. No regurgitation.  Right ventricle: Normal cavity size, wall thickness and ejection fraction.  Mitral valve: Trace regurgitation.  Labs/Other Tests and Data Reviewed:    EKG:  No ECG reviewed.  Recent Labs: 07/18/2019: Hemoglobin 12.0; Platelets 353    Recent Lipid Panel Lab Results  Component Value Date/Time   CHOL 208 (H) 04/03/2013 08:26 AM   TRIG 156 (  H) 04/03/2013 08:26 AM   HDL 34 (L) 04/03/2013 08:26 AM   CHOLHDL 6.1 04/03/2013 08:26 AM   LDLCALC 143 (H) 04/03/2013 08:26 AM    Wt Readings from Last 3 Encounters:  09/02/19 185 lb (83.9 kg)  07/17/19 180 lb (81.6 kg)  11/05/18 180 lb (81.6 kg)     Objective:    Vital Signs:  Ht 5\' 9"  (1.753 m)   Wt 185 lb (83.9 kg)   BMI 27.32 kg/m    VITAL SIGNS:  reviewed  ASSESSMENT & PLAN:    1.  Coronary disease/shortness of breath: Nuclear stress test suggestive of ischemia.  He underwent four-vessel CABG in January 2020.  Continue aspirin, beta-blocker, and statin.  Due to his symptoms and stress test results, I added Imdur 30 mg daily.  While this alleviated his symptoms of shortness of breath, and made him feel lightheaded and at times he felt like he was going to pass out if he stood up too quickly.  He ran out of medication about a week ago and has had no recurrence of lightheadedness and near syncope, and is also not had any recurrence of shortness of breath.  I reduced aspirin to 81 mg at his last visit.  2. Hypertension:  He does not have a blood pressure cuff at home.  No changes to therapy.  3. Hyperlipidemia: Continue statin.    COVID-19 Education: The signs and symptoms of COVID-19 were discussed with the patient and how to seek care for testing (follow up with PCP or arrange E-visit).  The importance of social distancing was discussed today.  Time:   Today, I have spent 15 minutes with the patient with telehealth technology discussing the above problems.     Medication Adjustments/Labs and Tests Ordered: Current medicines are reviewed at length with the patient today.  Concerns regarding medicines are outlined above.   Tests Ordered: No orders of the defined types were placed in this encounter.   Medication Changes: No orders of the defined types were  placed in this encounter.   Follow Up:  In Person in 6 month(s)  Signed, Kate Sable, MD  09/02/2019 8:19 AM    [place]

## 2019-09-02 NOTE — Patient Instructions (Signed)
Medication Instructions:   Decrease your Aspirin to 81mg  daily (baby Aspirin).  Stop the Aspirin 325mg .  Remain off of the Imdur - removed from medication list today.   Continue all other medications.    Labwork: none  Testing/Procedures: none  Follow-Up: 6 months   Any Other Special Instructions Will Be Listed Below (If Applicable).  If you need a refill on your cardiac medications before your next appointment, please call your pharmacy.

## 2019-09-02 NOTE — Addendum Note (Signed)
Addended by: Laurine Blazer on: 09/02/2019 08:40 AM   Modules accepted: Orders

## 2019-11-25 ENCOUNTER — Telehealth: Payer: Self-pay | Admitting: Cardiovascular Disease

## 2019-11-25 DIAGNOSIS — R69 Illness, unspecified: Secondary | ICD-10-CM | POA: Diagnosis not present

## 2019-11-25 DIAGNOSIS — M7061 Trochanteric bursitis, right hip: Secondary | ICD-10-CM | POA: Diagnosis not present

## 2019-11-25 DIAGNOSIS — Z79899 Other long term (current) drug therapy: Secondary | ICD-10-CM | POA: Diagnosis not present

## 2019-11-25 DIAGNOSIS — M461 Sacroiliitis, not elsewhere classified: Secondary | ICD-10-CM | POA: Diagnosis not present

## 2019-11-25 DIAGNOSIS — M5412 Radiculopathy, cervical region: Secondary | ICD-10-CM | POA: Diagnosis not present

## 2019-11-25 DIAGNOSIS — M961 Postlaminectomy syndrome, not elsewhere classified: Secondary | ICD-10-CM | POA: Diagnosis not present

## 2019-11-25 DIAGNOSIS — J449 Chronic obstructive pulmonary disease, unspecified: Secondary | ICD-10-CM | POA: Diagnosis not present

## 2019-11-25 NOTE — Telephone Encounter (Signed)
   [facility] Pre-operative Risk Assessment    HEARTCARE STAFF: - Please ensure there is not already an duplicate clearance open for this procedure. - Under Visit Info/Reason for Call, type in Other and utilize the format Clearance MM/DD/YY or Clearance TBD. Do not use dashes or single digits. - If request is for dental extraction, please clarify the # of teeth to be extracted.  Request for surgical clearance:  1. What type of surgery is being performed? Spinal Injection  2. When is this surgery scheduled? TBD  3. What type of clearance is required (medical clearance vs. Pharmacy clearance to hold med vs. Both)? Pharmacy  4. Are there any medications that need to be held prior to surgery and how long? Aspirin 7 days prior  5. Practice name and name of physician performing surgery? [facility]   Dr Marlaine Hind  6. What is the office phone number? [phone]   7.   What is the office fax number? [phone]  8.   Anesthesia type (None, local, MAC, general) ? Not stated   Weston Anna 11/25/2019, 1:02 PM  _________________________________________________________________   (provider comments below)

## 2019-11-26 NOTE — Telephone Encounter (Signed)
   Primary Cardiologist: Kate Sable, MD  Chart reviewed as part of pre-operative protocol coverage. Given past medical history and time since last visit, based on ACC/AHA guidelines, YUMA SHIMP would be at acceptable risk for the planned procedure without further cardiovascular testing.   His aspirin may be held for 7 days prior to the procedure.  Please restart as soon as hemostasis is achieved.  I will route this recommendation to the requesting party via Epic fax function and remove from pre-op pool.  Please call with questions.  Jossie Ng. Braylee Bosher NP-C    11/26/2019, 11:55 AM [address] Office [phone] Fax [phone]

## 2019-11-26 NOTE — Telephone Encounter (Signed)
ASA can be held for 7 days prior to procedure.

## 2019-11-29 IMAGING — CR DG CHEST 2V
2 series · 2 of 2 positions shown · non-contrast
Comparison: 01/12/2017 chest radiograph.

CLINICAL DATA: Preoperative for cardiac surgery

EXAM:
CHEST - 2 VIEW

[w chest pa]
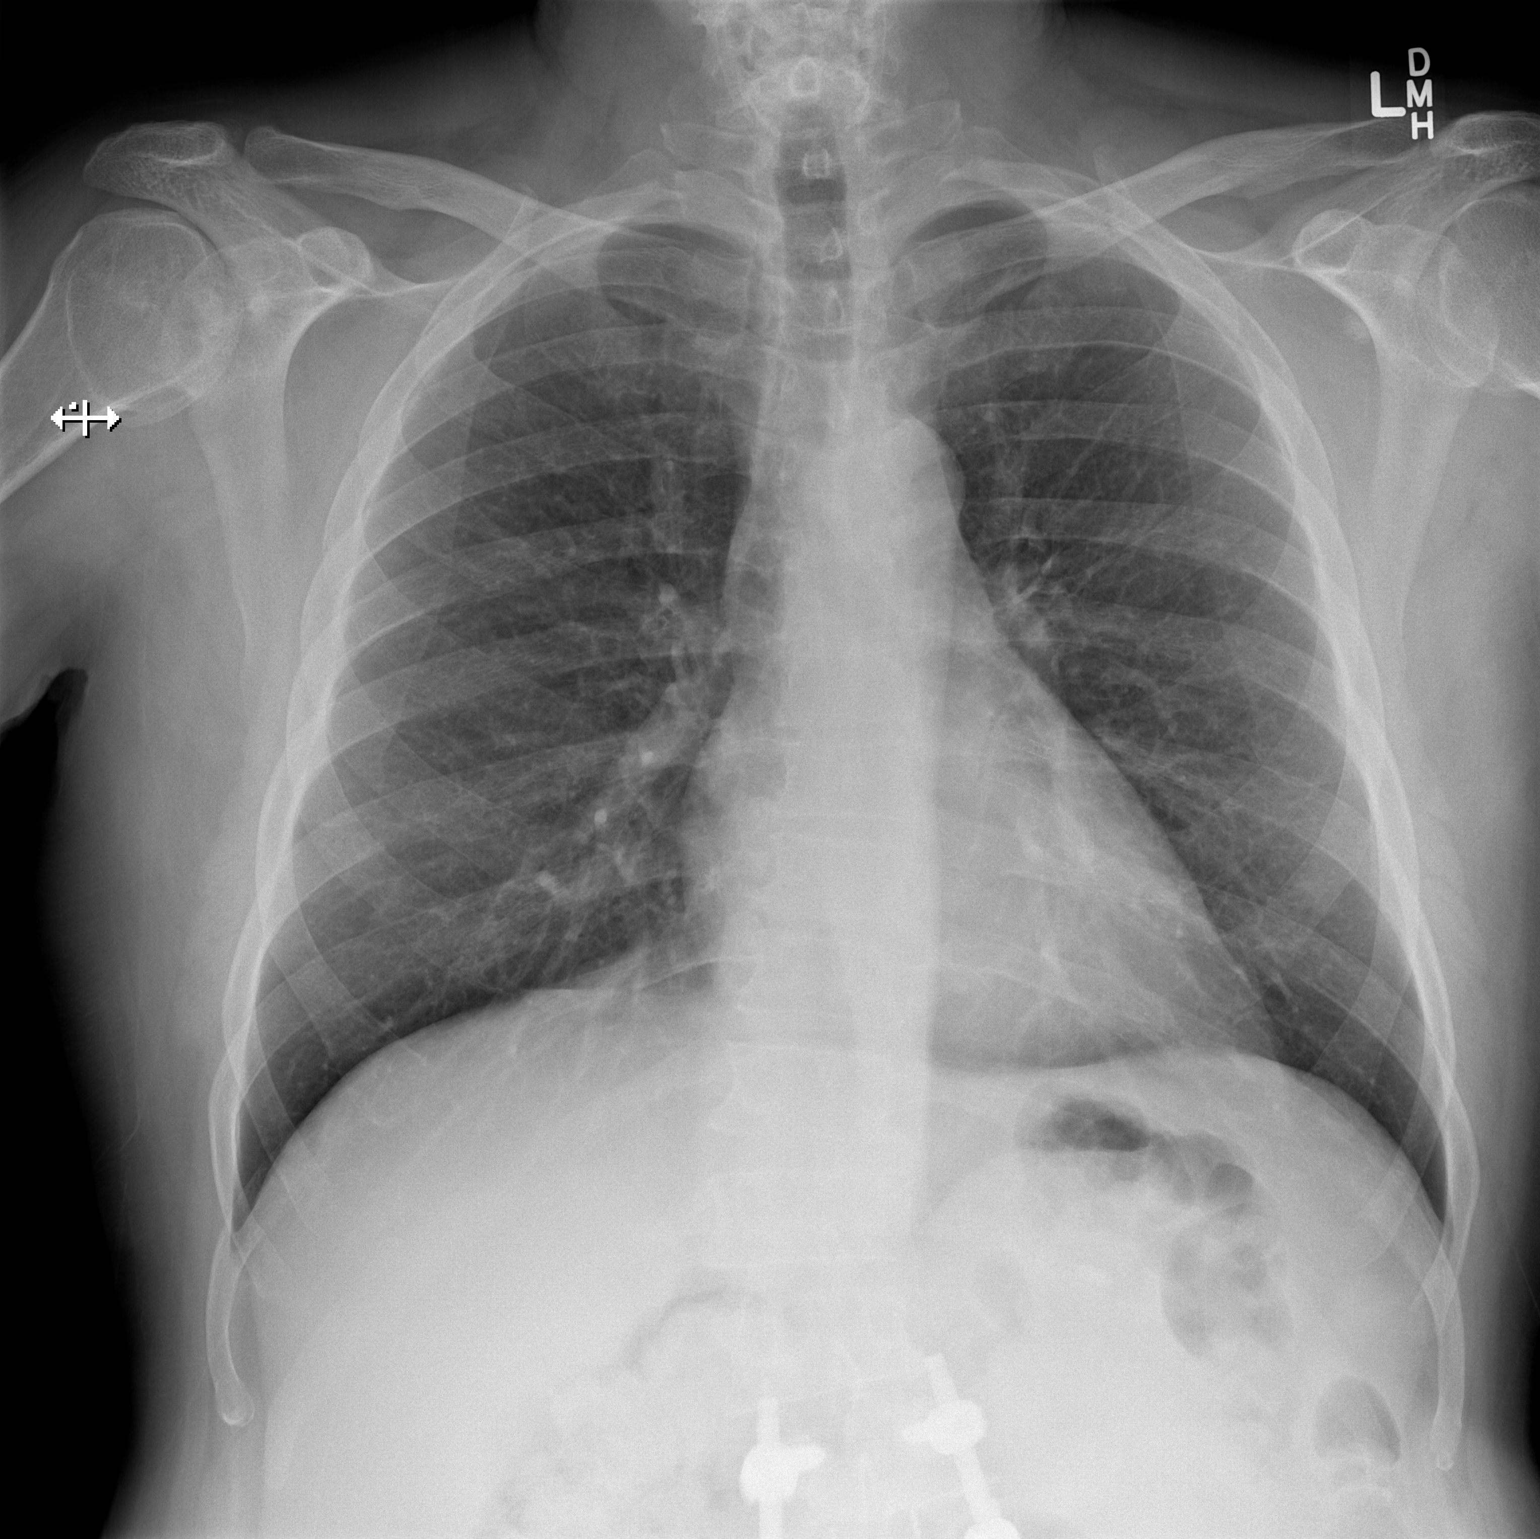

[w chest lat]
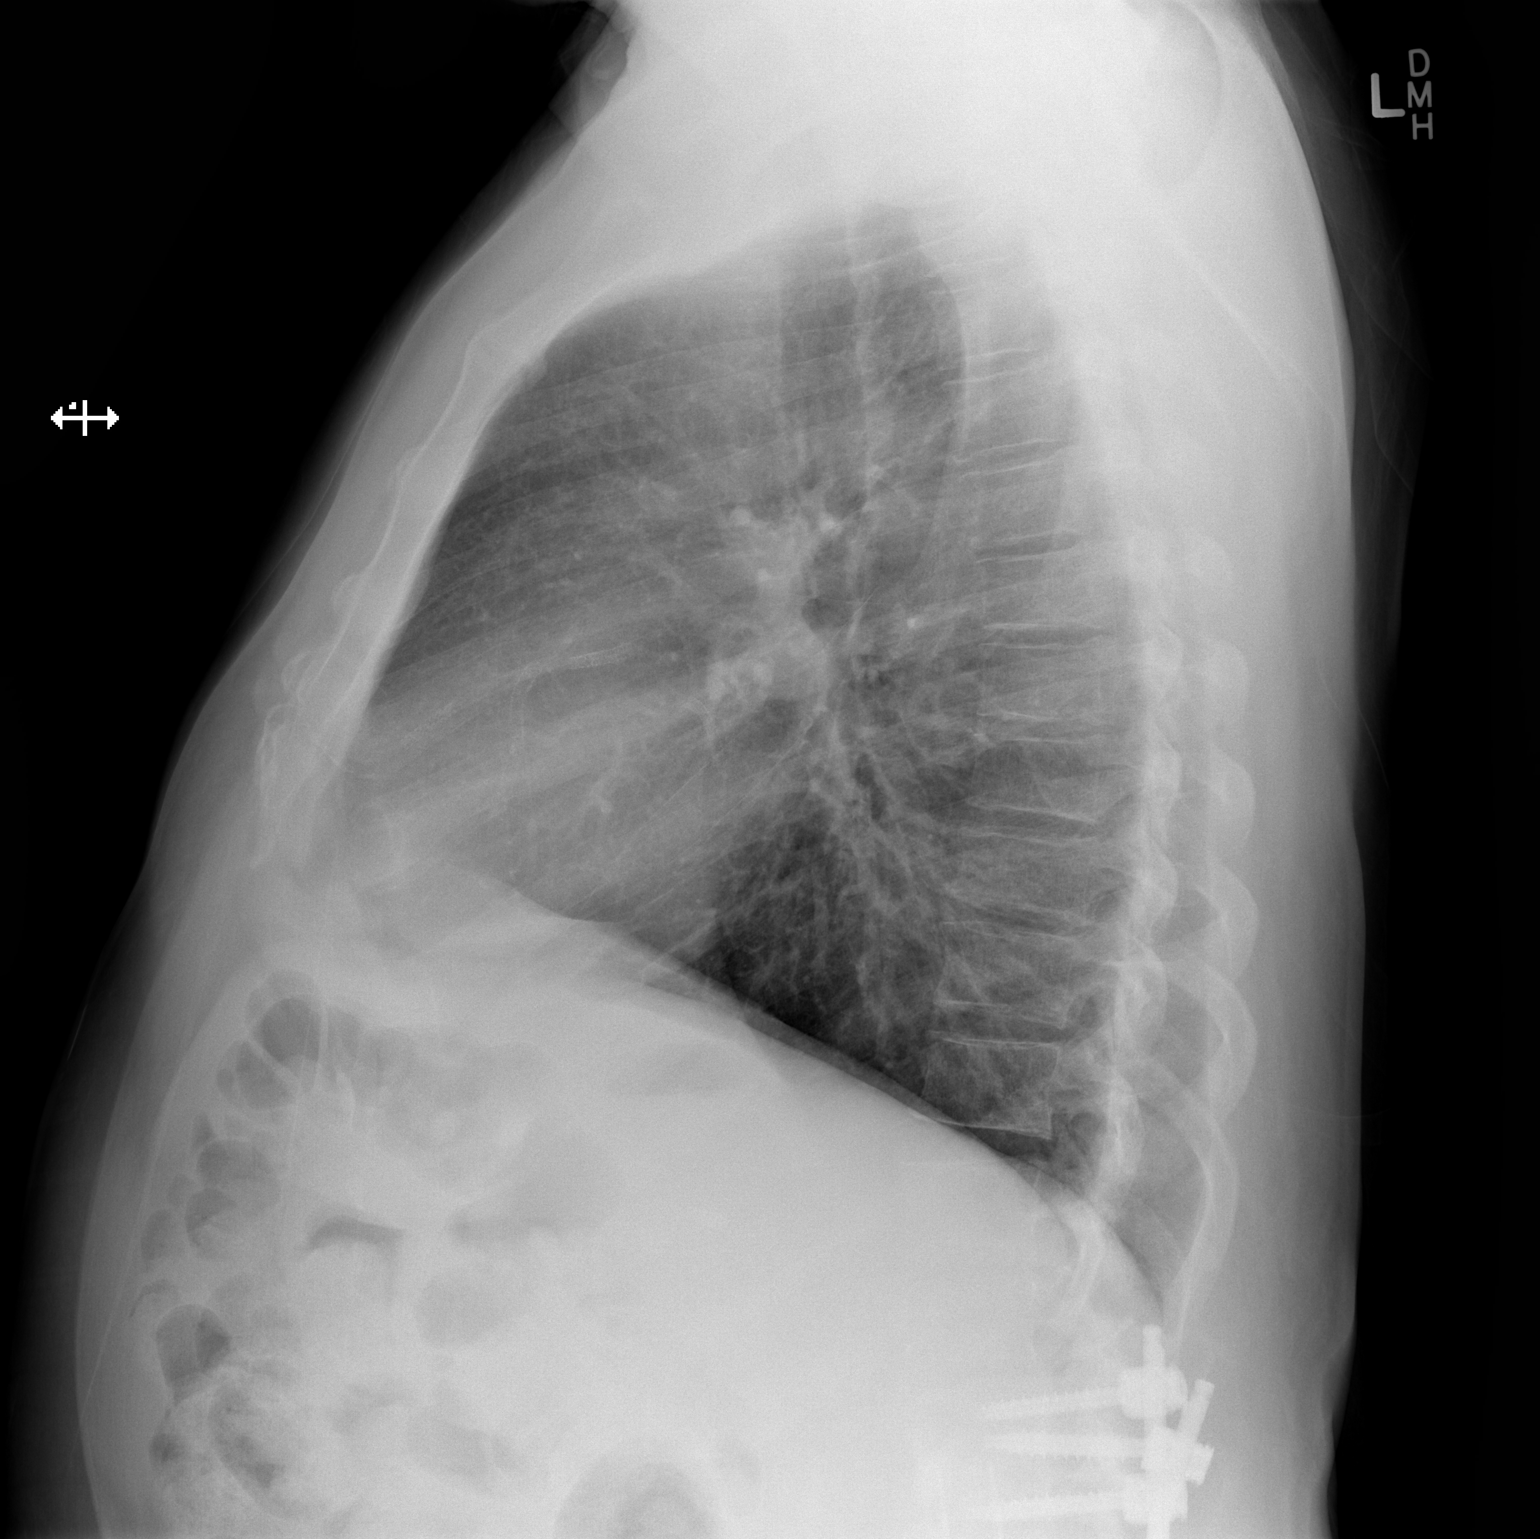

[2 of 2 positions shown; findings below may reference images not displayed]

FINDINGS: Stable cardiomediastinal silhouette with normal heart size. No
pneumothorax. No pleural effusion. Lungs appear clear, with no acute
consolidative airspace disease and no pulmonary edema. Coronary
stents overlie the left heart. Partially visualized posterior lumbar
spine fusion hardware.
IMPRESSION: No active cardiopulmonary disease.

## 2019-12-03 ENCOUNTER — Other Ambulatory Visit: Payer: Self-pay | Admitting: Cardiovascular Disease

## 2019-12-04 IMAGING — DX DG CHEST 1V PORT
1 series · 1 of 1 positions shown · non-contrast
Comparison: 07/24/2018

CLINICAL DATA: Follow-up chest tube following coronary bypass
grafting

EXAM:
PORTABLE CHEST 1 VIEW

[chest]
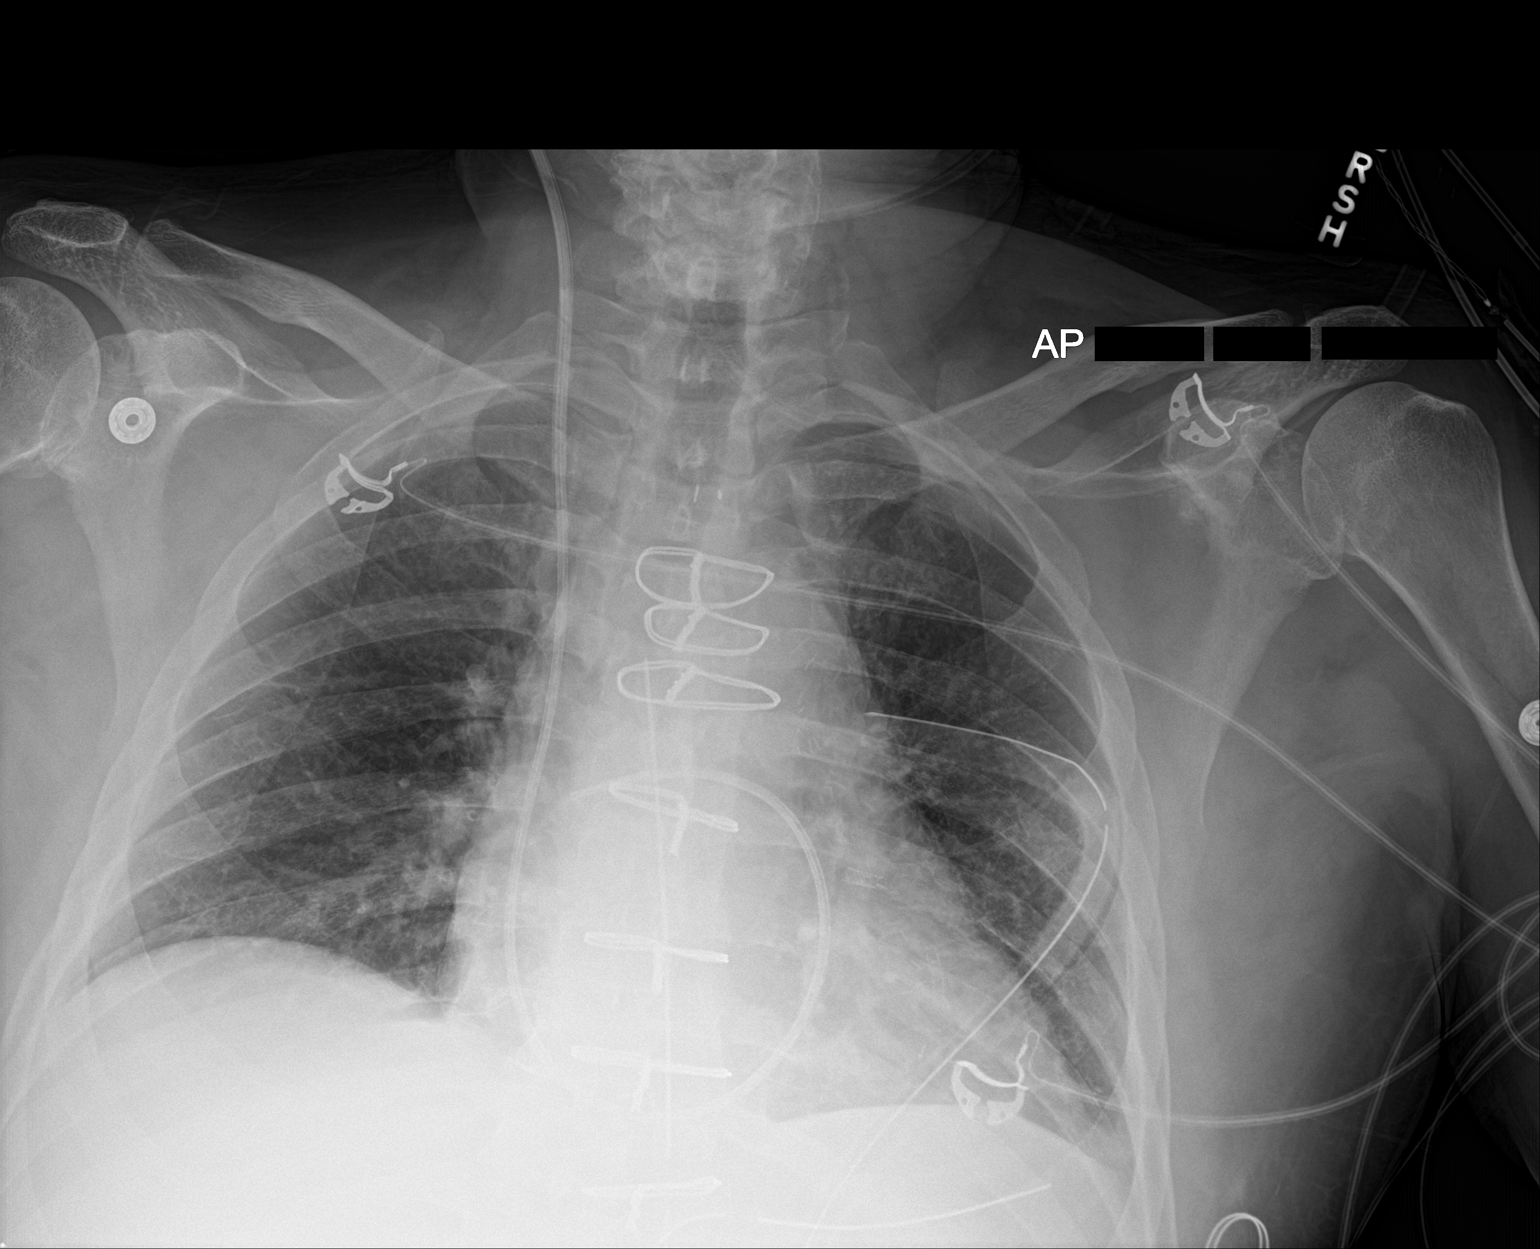

[1 of 1 positions shown; findings below may reference images not displayed]

FINDINGS: Mediastinal drain, left thoracostomy tube, pericardial drain and
Swan-Ganz catheter are again noted. The endotracheal tube and
nasogastric catheter have been removed in the interval. The lungs
are well aerated bilaterally. No pneumothorax is seen. No focal
infiltrate is noted.
IMPRESSION: Tubes and lines as described above.  No acute infiltrate noted.

## 2019-12-05 IMAGING — DX DG CHEST 1V PORT
1 series · 1 of 1 positions shown · non-contrast
Comparison: 07/25/2018

CLINICAL DATA: Status post coronary bypass grafting

EXAM:
PORTABLE CHEST 1 VIEW

[chest]
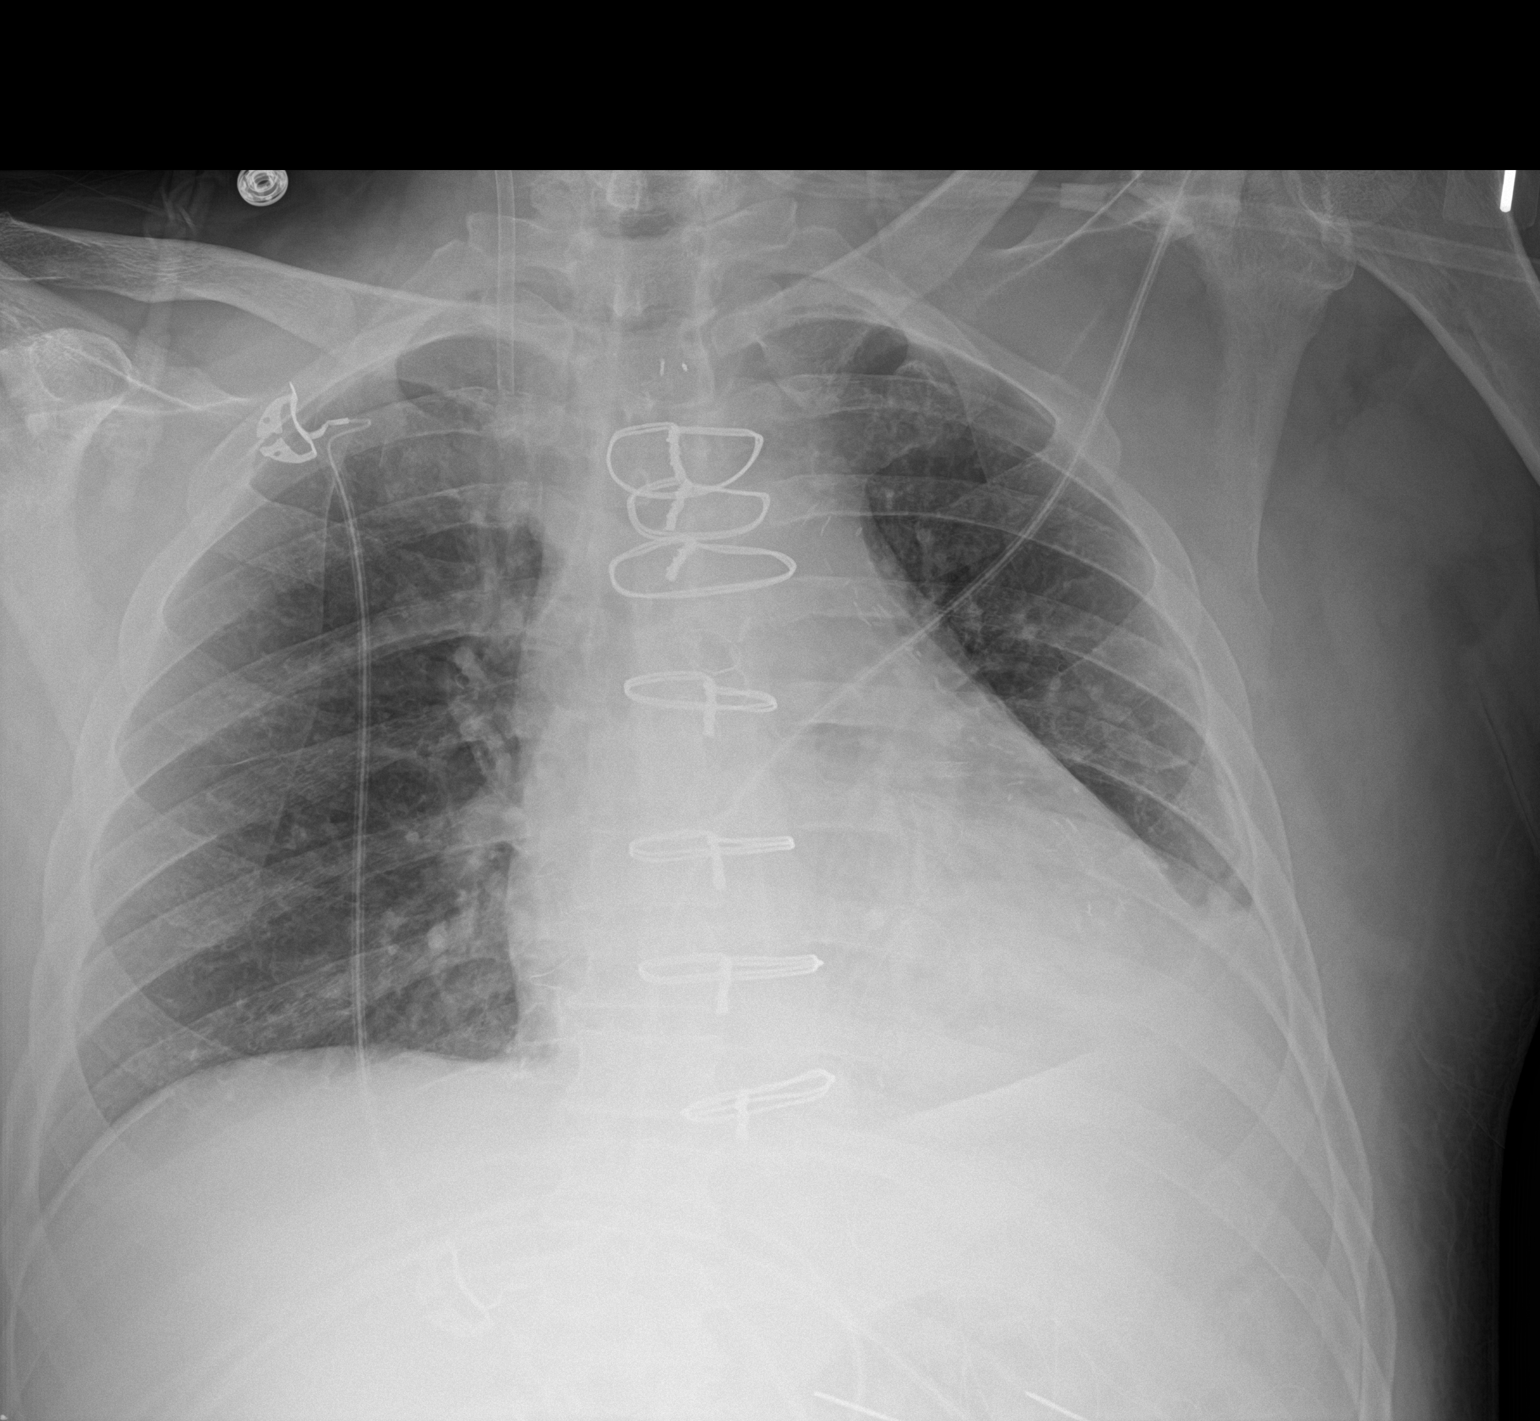

[1 of 1 positions shown; findings below may reference images not displayed]

FINDINGS: Swan-Ganz catheter has been removed. Right jugular sheath remains in
place. Cardiac shadow remains enlarged. Mediastinal drain,
pericardial drain and left thoracostomy catheter have been removed.
No sizable pneumothorax is noted. Mild left basilar atelectasis and
small effusion are seen. The right lung is clear.
IMPRESSION: Interval removal of tubes and lines as described.

Left basilar atelectasis with small effusion.

## 2020-01-01 IMAGING — CR DG CHEST 2V
2 series · 2 of 2 positions shown · non-contrast
Comparison: 07/27/2018

CLINICAL DATA: Post CABG.

EXAM:
CHEST - 2 VIEW

[w chest pa]
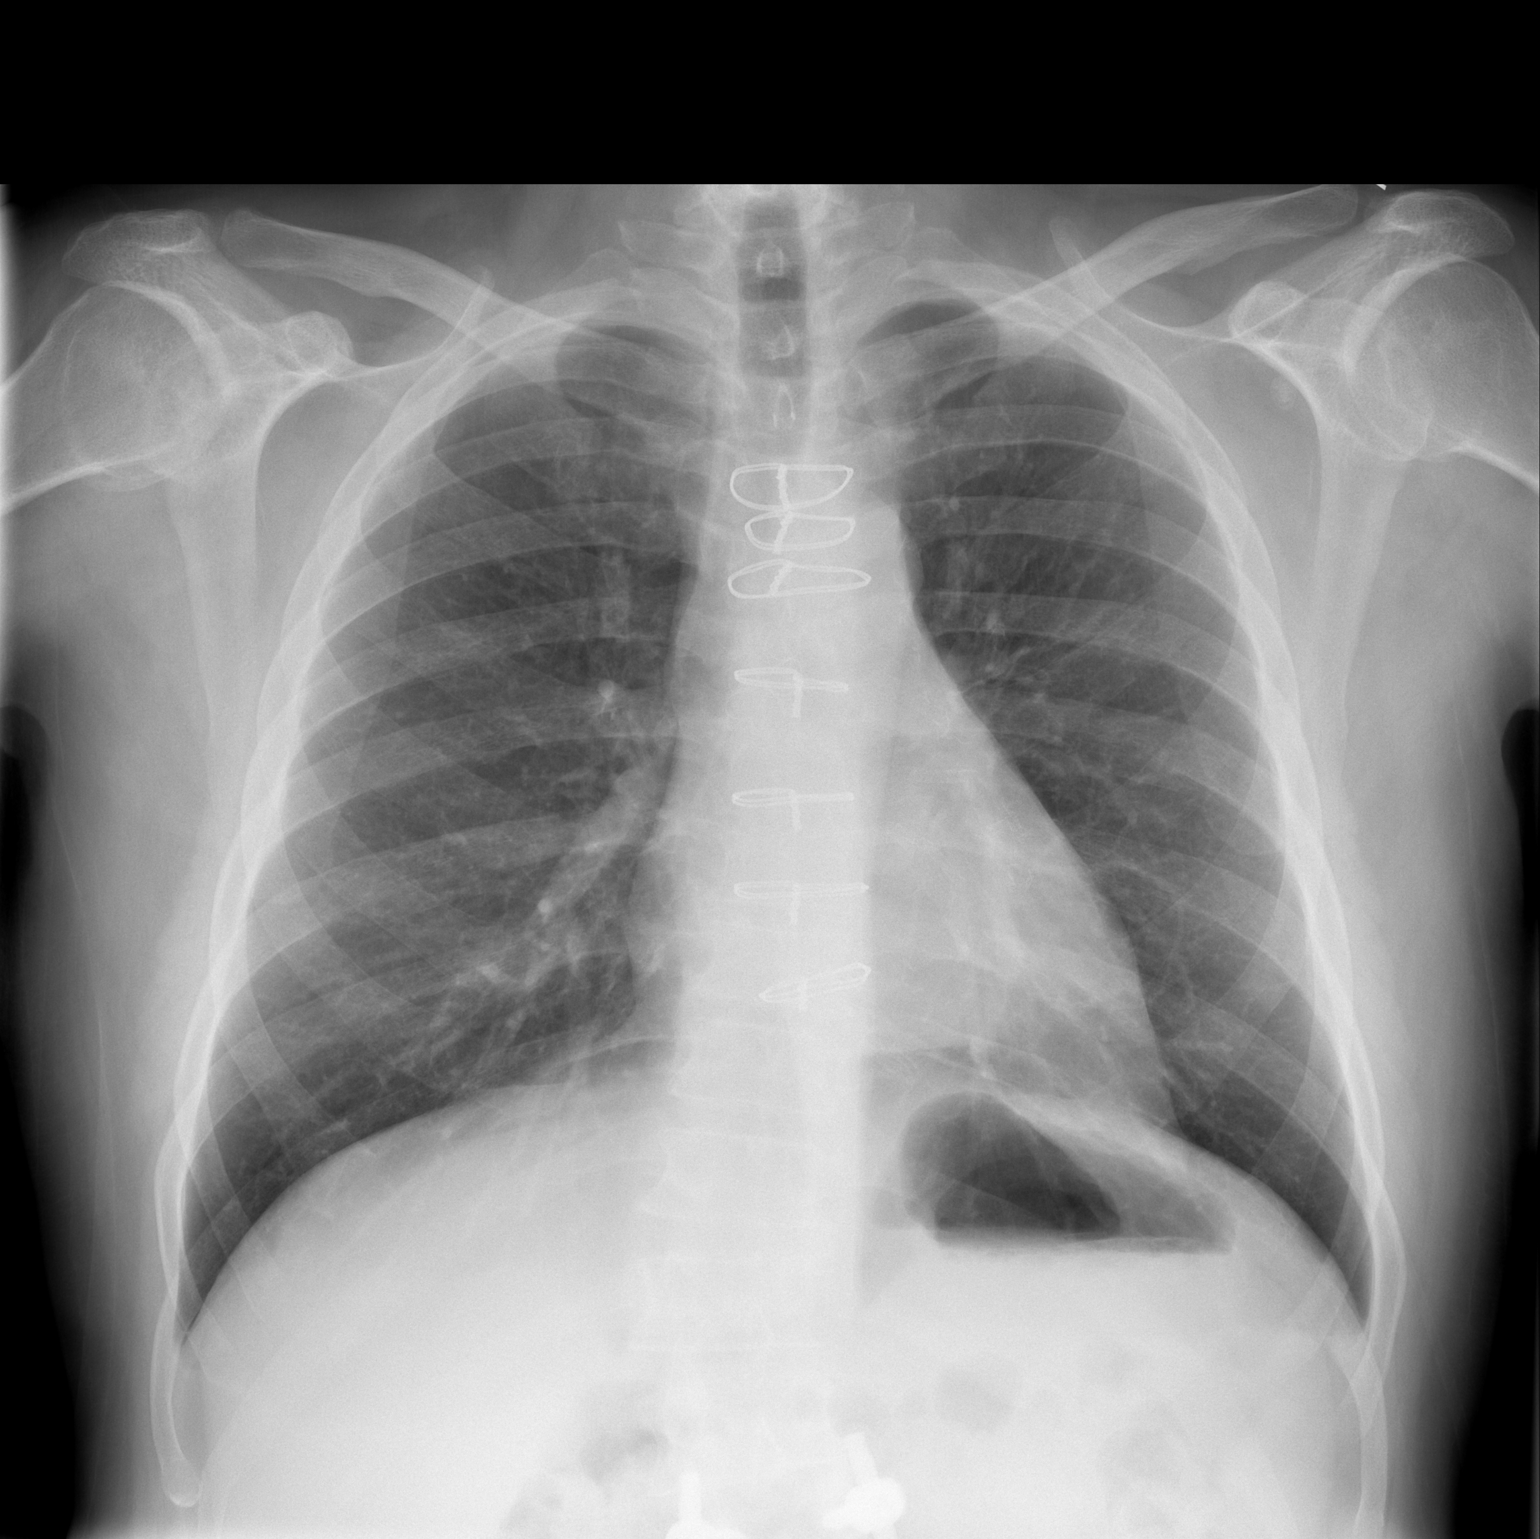

[w chest lat]
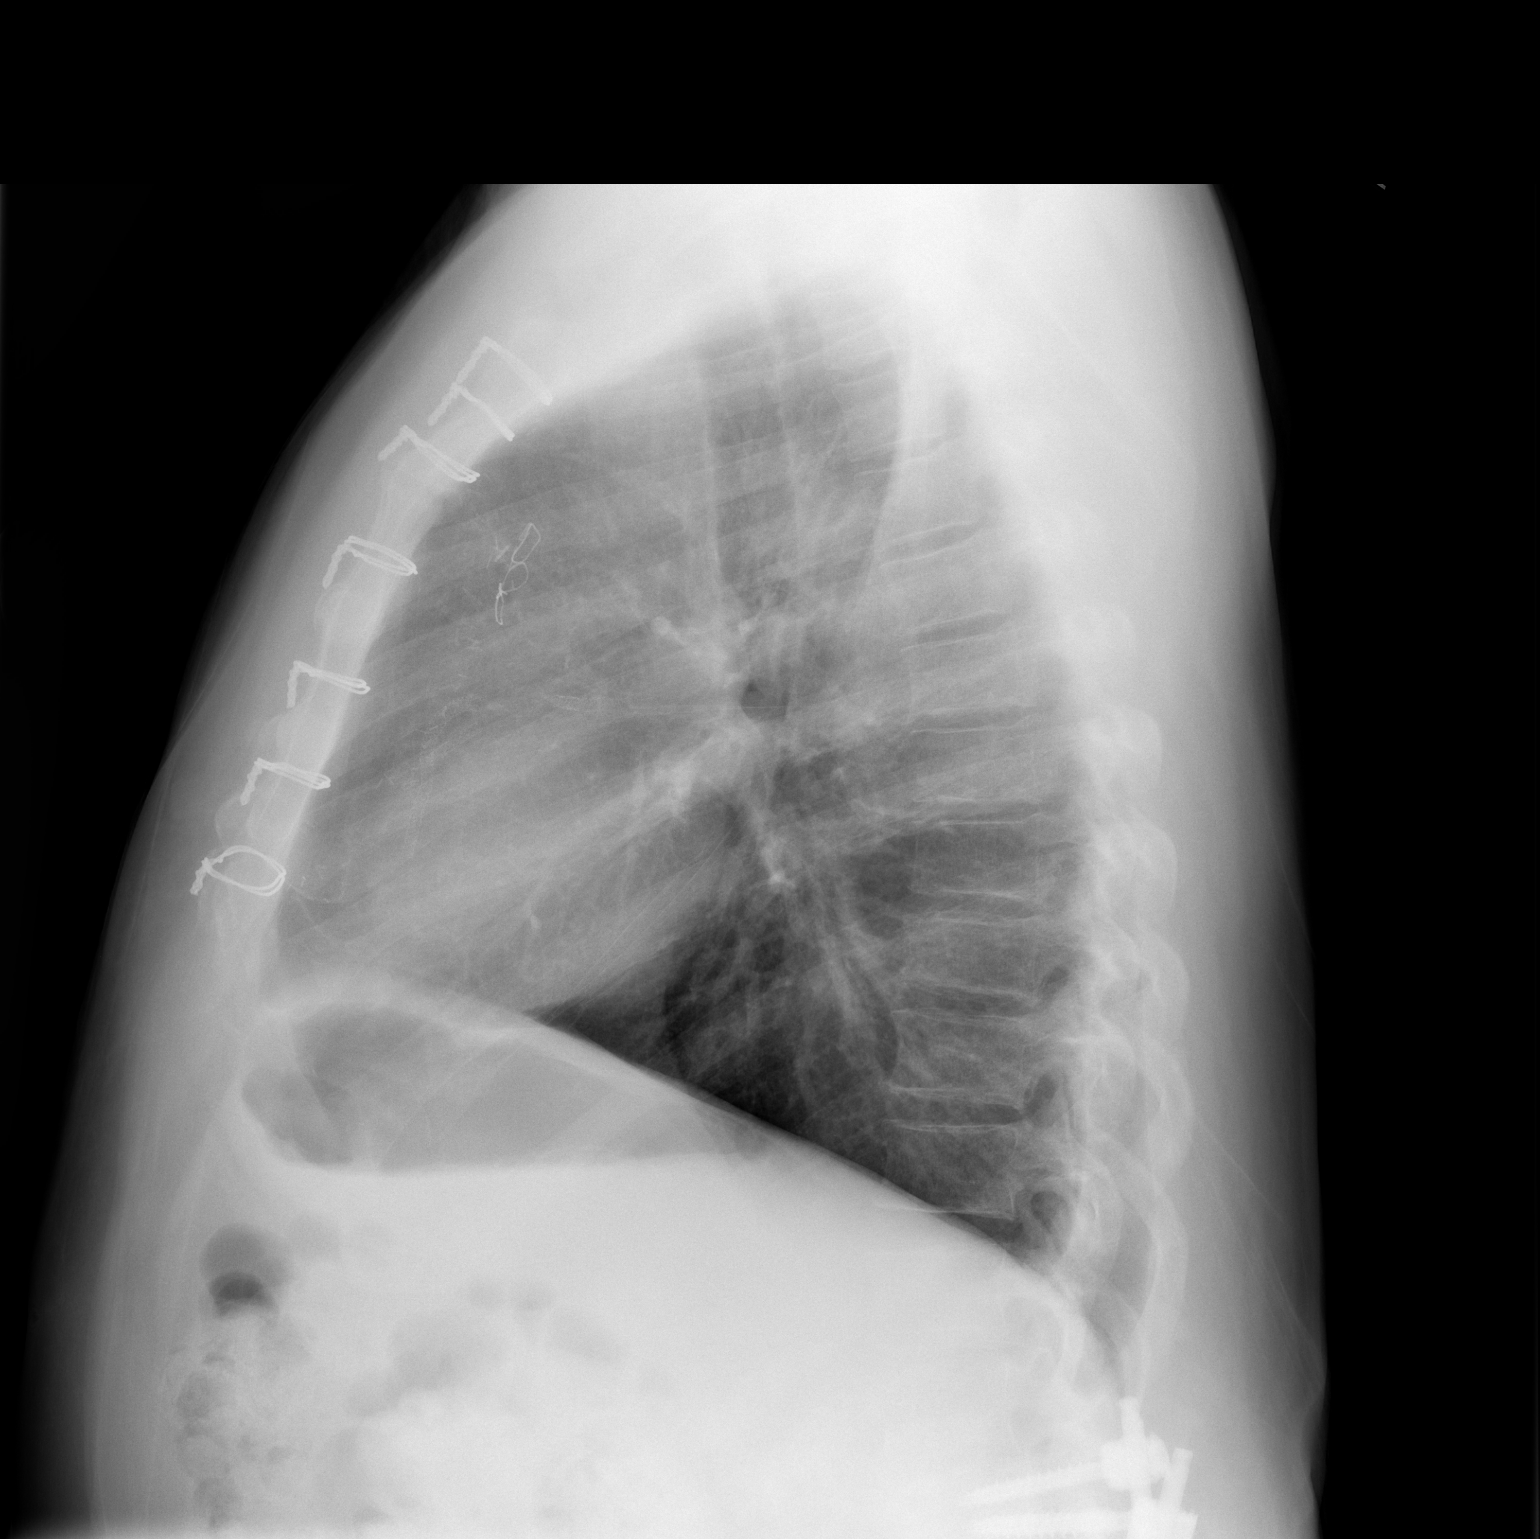

[2 of 2 positions shown; findings below may reference images not displayed]

FINDINGS: Sternotomy wires overlie normal cardiac silhouette. Interval
resolution of LEFT pleural effusion and atelectasis. Lungs are
clear. Posterior lumbar fusion noted.
IMPRESSION: No acute cardiopulmonary process.

## 2020-01-14 DIAGNOSIS — M5412 Radiculopathy, cervical region: Secondary | ICD-10-CM | POA: Diagnosis not present

## 2020-03-05 DIAGNOSIS — Z79899 Other long term (current) drug therapy: Secondary | ICD-10-CM | POA: Diagnosis not present

## 2020-03-05 DIAGNOSIS — Z7189 Other specified counseling: Secondary | ICD-10-CM | POA: Diagnosis not present

## 2020-03-05 DIAGNOSIS — I10 Essential (primary) hypertension: Secondary | ICD-10-CM | POA: Diagnosis not present

## 2020-03-05 DIAGNOSIS — Z0000 Encounter for general adult medical examination without abnormal findings: Secondary | ICD-10-CM | POA: Diagnosis not present

## 2020-03-05 DIAGNOSIS — Z1331 Encounter for screening for depression: Secondary | ICD-10-CM | POA: Diagnosis not present

## 2020-03-05 DIAGNOSIS — Z299 Encounter for prophylactic measures, unspecified: Secondary | ICD-10-CM | POA: Diagnosis not present

## 2020-03-05 DIAGNOSIS — I509 Heart failure, unspecified: Secondary | ICD-10-CM | POA: Diagnosis not present

## 2020-03-05 DIAGNOSIS — E7800 Pure hypercholesterolemia, unspecified: Secondary | ICD-10-CM | POA: Diagnosis not present

## 2020-03-05 DIAGNOSIS — E039 Hypothyroidism, unspecified: Secondary | ICD-10-CM | POA: Diagnosis not present

## 2020-03-05 DIAGNOSIS — Z1339 Encounter for screening examination for other mental health and behavioral disorders: Secondary | ICD-10-CM | POA: Diagnosis not present

## 2020-03-05 DIAGNOSIS — Z125 Encounter for screening for malignant neoplasm of prostate: Secondary | ICD-10-CM | POA: Diagnosis not present

## 2020-03-05 DIAGNOSIS — Z6828 Body mass index (BMI) 28.0-28.9, adult: Secondary | ICD-10-CM | POA: Diagnosis not present

## 2020-03-11 NOTE — Progress Notes (Deleted)
Cardiology Office Note  Date: 03/11/2020   ID: Roy [name], Roy [name] 1955-07-18, MRN [id number]  PCP:  Monico Blitz, MD  Cardiologist:  No primary care provider on file. Electrophysiologist:  None   Chief Complaint: Follow-up CAD  History of Present Illness: Roy [name] is a 64 y.o. male with a history of CAD was four-vessel CABG January 2020 (LIMA-LAD, SVG-PDA, sequential SVG-OM-RI).  Other history includes COPD, hep C, HLD, HTN, hypothyroidism, arthritis, GERD, chronic lower back pain.  Nuclear stress test showed reversible defect in apical to basal inferior wall suggestive of ischemia, EF 57%.  Intermediate study.  Imdur was added 30 mg daily.  Last saw Dr. Bronson Ing 09/11/2019 via telemedicine.  Patient stated after he had been taking the Imdur his symptoms of shortness of breath had resolved.  He then started feeling lightheaded and felt like he was going to pass out when he would stand up.  Stated he had run out of the Imdur and symptoms had resolved.  He was continuing aspirin, beta-blocker, and statin.  His aspirin was reduced to 81 mg.  There were no changes to his antihypertensive therapy and he was continuing statin medications.   Past Medical History:  Diagnosis Date  . Abdominal pain, right upper quadrant   . Arthritis    "all my joints" (11/28/2013)  . Blind left eye   . CAD (coronary artery disease)    a. Prior BMS to OM and RCA, subsequent angioplasty due to ISR in these distributions, also BMS placement with DES to LAD. b. Cath 01/2013: diffuse 3V CAD without clear culprit lesion, normal LVEF, mid LAD lesion noted, treat medically. c. Cath 11/28/2013 DES to mid LAD lesion with abnormal FFR, DES to distal LAD lesion, DES to prox RCA d. 07/24/2018:CABG x4 with LIMA-LAD, SVG-PDA, and Seq-SVG-OM-RI.  [name] Chronic lower back pain   . COPD (chronic obstructive pulmonary disease) ([place])   . DDD (degenerative disc disease)   . Depression   . GERD (gastroesophageal reflux  disease)   . Hepatitis C C   "had the tx and I'm free of it"  . History of blood transfusion    "when I was taking tx for hepatitis"  . History of kidney stones   . Hyperlipidemia   . Hypertension   . Hypothyroidism   . Kidney stones    "no surgeries"  . Macular degeneration of both eyes    "already blind in my left eye"  . Myocardial infarction ([place]) ~ 2007   . Other and unspecified angina pectoris   . Scoliosis   . Sinus bradycardia     Past Surgical History:  Procedure Laterality Date  . BACK SURGERY     x3 for scoliosis  . CARDIAC CATHETERIZATION     "I've had a few without intervention"  . COLONOSCOPY    . COLONOSCOPY N/A 05/02/2013   Procedure: COLONOSCOPY;  Surgeon: Rogene Houston, MD;  [facility];  Service: Endoscopy;  Laterality: N/A;  1030-moved to 1055 Ann to notify pt  . COLONOSCOPY N/A 07/06/2017   Procedure: COLONOSCOPY;  Surgeon: Rogene Houston, MD;  [facility];  Service: Endoscopy;  Laterality: N/A;  12:45  . CORONARY ANGIOPLASTY WITH STENT PLACEMENT   2007; 2008; 11/28/2013   "2 + 2; + 3"  . CORONARY ARTERY BYPASS GRAFT N/A 07/24/2018   Procedure: CORONARY ARTERY BYPASS GRAFTING (CABG);  Surgeon: Gaye Pollack, MD;  Location: [place];  Service: Open Heart Surgery;  Laterality: N/A;  Times 4 using left internal mammary artery and endoscopically harvested right saphenous vein  . INGUINAL HERNIA REPAIR Bilateral ~ 2000  . LEFT HEART CATH AND CORONARY ANGIOGRAPHY N/A 07/12/2018   Procedure: LEFT HEART CATH AND CORONARY ANGIOGRAPHY;  Surgeon: Jettie Booze, MD;  Location: [facility];  Service: Cardiovascular;  Laterality: N/A;  . LEFT HEART CATHETERIZATION WITH CORONARY ANGIOGRAM N/A 01/30/2013   Procedure: LEFT HEART CATHETERIZATION WITH CORONARY ANGIOGRAM;  Surgeon: Jolaine Artist, MD;  Location: [facility];  Service: Cardiovascular;  Laterality: N/A;  . LEFT HEART CATHETERIZATION WITH CORONARY ANGIOGRAM N/A 11/28/2013    Procedure: LEFT HEART CATHETERIZATION WITH CORONARY ANGIOGRAM;  Surgeon: Jettie Booze, MD;  Location: [facility] CATH LAB;  Service: Cardiovascular;  Laterality: N/A;  . LIVER BIOPSY  03/24/2010  . POLYPECTOMY  07/06/2017   Procedure: POLYPECTOMY;  Surgeon: Rogene Houston, MD;  [facility];  Service: Endoscopy;;  colon  . TEE WITHOUT CARDIOVERSION N/A 07/24/2018   Procedure: TRANSESOPHAGEAL ECHOCARDIOGRAM (TEE);  Surgeon: Gaye Pollack, MD;  Location: [place];  Service: Open Heart Surgery;  Laterality: N/A;  . TOOTH EXTRACTION      Current Outpatient Medications  Medication Sig Dispense Refill  . amLODipine (NORVASC) 10 MG tablet Take 10 mg by mouth daily.    [name] aspirin EC 81 MG tablet Take 1 tablet (81 mg total) by mouth daily.    . carvedilol (COREG) 6.25 MG tablet Take 6.25 mg by mouth 2 (two) times daily with a meal.    . cyclobenzaprine (FLEXERIL) 10 MG tablet Take 10 mg by mouth at bedtime.     [name] escitalopram (LEXAPRO) 20 MG tablet Take 20 mg by mouth daily.     . folic acid (FOLVITE) 1 MG tablet Take 1 mg by mouth 2 (two) times daily.    . furosemide (LASIX) 20 MG tablet Take 1 tablet (20 mg total) by mouth daily as needed. For weight gain of 3-5 lbs (Patient taking differently: Take 20 mg by mouth daily. For weight gain of 3-5 lbs) 30 tablet   . gabapentin (NEURONTIN) 300 MG capsule Take 600 mg by mouth 3 (three) times daily.     [name] levothyroxine (SYNTHROID, LEVOTHROID) 75 MCG tablet Take 75 mcg by mouth daily before breakfast.    . lisinopril (ZESTRIL) 5 MG tablet Take 5 mg by mouth every other day.    . methotrexate 2.5 MG tablet Take 20 mg by mouth once a week. Caution:Chemotherapy. Protect from light.    . nitroGLYCERIN (NITROSTAT) 0.4 MG SL tablet Place 1 tablet (0.4 mg total) under the tongue every 5 (five) minutes x 3 doses as needed. (Patient taking differently: Place 0.4 mg under the tongue every 5 (five) minutes x 3 doses as needed for chest pain. ) 25 tablet 3  .  oxyCODONE-acetaminophen (PERCOCET) 10-325 MG tablet Take 1 tablet by mouth every 6 (six) hours as needed for pain.    . potassium chloride (K-DUR) 10 MEQ tablet Take 1 tablet (10 mEq total) by mouth daily as needed. On days you take Lasix (Patient taking differently: Take 10 mEq by mouth daily. On days you take Lasix)    . rosuvastatin (CRESTOR) 20 MG tablet TAKE (1) TABLET BY MOUTH ONCE DAILY. 28 tablet 11  . tamsulosin (FLOMAX) 0.4 MG CAPS capsule Take 0.4 mg by mouth daily.    . traZODone (DESYREL) 100 MG tablet Take 100 mg by mouth at bedtime.  No current facility-administered medications for this visit.   Allergies:  Penicillins, Imdur [isosorbide nitrate], Codeine, and Food   Social History: The patient  reports that he quit smoking about 14 years ago. His smoking use included cigarettes. He started smoking about 48 years ago. He has a 52.50 pack-year smoking history. He has never used smokeless tobacco. He reports that he does not drink alcohol and does not use drugs.   Family History: The patient's family history includes Diabetes in his brother, brother, and mother; Healthy in his brother, son, and son; Heart disease in his brother.   ROS:  Please see the history of present illness. Otherwise, complete review of systems is positive for {NONE DEFAULTED:18576::"none"}.  All other systems are reviewed and negative.   Physical Exam: VS:  There were no vitals taken for this visit., BMI There is no height or weight on file to calculate BMI.  Wt Readings from Last 3 Encounters:  09/02/19 185 lb (83.9 kg)  07/17/19 180 lb (81.6 kg)  11/05/18 180 lb (81.6 kg)    General: Patient appears comfortable at rest. HEENT: Conjunctiva and lids normal, oropharynx clear with moist mucosa. Neck: Supple, no elevated JVP or carotid bruits, no thyromegaly. Lungs: Clear to auscultation, nonlabored breathing at rest. Cardiac: Regular rate and rhythm, no S3 or significant systolic murmur, no pericardial  rub. Abdomen: Soft, nontender, no hepatomegaly, bowel sounds present, no guarding or rebound. Extremities: No pitting edema, distal pulses 2+. Skin: Warm and dry. Musculoskeletal: No kyphosis. Neuropsychiatric: Alert and oriented x3, affect grossly appropriate.  ECG:  {EKG/Telemetry Strips Reviewed:[id number]}  Recent Labwork: 07/18/2019: Hemoglobin 12.0; Platelets 353     Component Value Date/Time   CHOL 208 (H) 04/03/2013 0826   TRIG 156 (H) 04/03/2013 0826   HDL 34 (L) 04/03/2013 0826   CHOLHDL 6.1 04/03/2013 0826   VLDL 31 04/03/2013 0826   LDLCALC 143 (H) 04/03/2013 0826    Other Studies Reviewed Today:   Cardiac Catheterization: 07/12/2018  Previously placed Prox RCA to Mid RCA stent (unknown type) is widely patent.  Mid LM to Dist LM lesion is 40% stenosed.  Mid RCA lesion is 10% stenosed.  Ost Ramus to Ramus lesion is 95% stenosed.  Ost 1st Mrg to 1st Mrg lesion is 95% stenosed.  Ost LAD lesion is 50% stenosed.  Previously placed Prox LAD stent (unknown type) is widely patent.  Ost 1st Diag to 1st Diag lesion is 99% stenosed.  Ost 2nd Diag to 2nd Diag lesion is 70% stenosed.  Mid LAD-1 lesion is 75% stenosed.  Previously placed Mid LAD-2 stent (unknown type) is widely patent.  Dist RCA lesion is 25% stenosed.  Dist Cx lesion is 90% stenosed.  The left ventricular systolic function is normal.  LV end diastolic pressure is normal.  The left ventricular ejection fraction is 55-65% by visual estimate.  There is no aortic valve stenosis.  Severe multivessel CAD. Multiple branches involved and moderate distal left main and ostial LAD disease as well. Severe mid LAD disease. CTO of large diagonal branch.   When compared to the 2015 cath, there has been significant progression of the mid LAD disease, branch vessel disease and mild progression of the distal left main disease.  Plan for Cardiac surgery consult for CABG.  TEE:  07/24/2018  Aortic valve: The valve is trileaflet. No stenosis. No regurgitation.  Right ventricle: Normal cavity size, wall thickness and ejection fraction.  Mitral valve: Trace regurgitation.  Assessment and Plan:  1. CAD in native  artery   2. Dyspnea, unspecified type   3. Essential hypertension   4. Hyperlipidemia, mixed      Medication Adjustments/Labs and Tests Ordered: Current medicines are reviewed at length with the patient today.  Concerns regarding medicines are outlined above.   Disposition: Follow-up with ***  Signed, Levell July, NP 03/11/2020 1:17 PM    [facility] [place], [place], [place] [address] Phone: [phone]; Fax: [phone]

## 2020-03-12 ENCOUNTER — Ambulatory Visit: Payer: Medicare HMO | Admitting: Family Medicine

## 2020-03-12 ENCOUNTER — Ambulatory Visit: Payer: Medicare HMO | Admitting: Cardiovascular Disease

## 2020-03-17 DIAGNOSIS — M461 Sacroiliitis, not elsewhere classified: Secondary | ICD-10-CM | POA: Diagnosis not present

## 2020-03-17 DIAGNOSIS — Z79899 Other long term (current) drug therapy: Secondary | ICD-10-CM | POA: Diagnosis not present

## 2020-03-17 DIAGNOSIS — J449 Chronic obstructive pulmonary disease, unspecified: Secondary | ICD-10-CM | POA: Diagnosis not present

## 2020-03-17 DIAGNOSIS — M5412 Radiculopathy, cervical region: Secondary | ICD-10-CM | POA: Diagnosis not present

## 2020-03-17 DIAGNOSIS — R69 Illness, unspecified: Secondary | ICD-10-CM | POA: Diagnosis not present

## 2020-03-17 DIAGNOSIS — M961 Postlaminectomy syndrome, not elsewhere classified: Secondary | ICD-10-CM | POA: Diagnosis not present

## 2020-03-22 NOTE — Progress Notes (Signed)
Cardiology Office Note  Date: 03/23/2020   ID: Roy, [name] [birth year], MRN [id number]  PCP:  Roy Blitz, MD  Cardiologist:  No primary care provider on file. Electrophysiologist:  None   Chief Complaint: Follow-up CAD  History of Present Illness: Roy [name] is a 64 y.o. male with a history of CAD with four-vessel CABG January 2020 (LIMA-LAD, SVG-PDA, sequential SVG-OM-RI).  Other history includes COPD, hep C, HLD, HTN, hypothyroidism, arthritis, GERD, chronic lower back pain.  Nuclear stress test 07/29/2019 showed reversible defect in apical to basal inferior wall suggestive of ischemia, EF 57%.  Intermediate study. In Dr. Court [name] note on 07/30/2019 after examining the stress test results he stated he would add Imdur to optimize medical therapy.  If he was unable to improve symptoms with guideline directed medical therapy he would then arrange for cardiac catheterization.  Last saw Dr. Bronson [name] 09/11/2019 via telemedicine.  Patient stated after he had been taking the Imdur his symptoms of shortness of breath had resolved.  He then started feeling lightheaded and felt like he was going to pass out when he would stand up.  Stated he had run out of the Imdur and symptoms had resolved.  He no longer had chest pain.  He was continuing aspirin, beta-blocker, and statin.  His aspirin was reduced to 81 mg.  There were no changes to his antihypertensive therapy and he was continuing statin medications.   Patient arrives today for 13-month follow-up.  He states he has been having chest pain on and off over the last month.  States the chest pain can occur with and without activity.  States he does have some radiation to left arm when it occurs.  He denies any associated nausea, vomiting, or diaphoresis.  Dr. Bronson [name] had mentioned earlier after examining stress test if unable to improve symptoms with medical therapy would likely have the patient undergo a cardiac catheterization.  He  tried patient on Imdur 30 mg daily.  Patient was having some issues with feeling lightheaded and near syncopal.  The patient stopped the medication on his own.  Patient states he would like definitive therapy in the form of a cardiac catheterization.   Past Medical History:  Diagnosis Date  . Abdominal pain, right upper quadrant   . Arthritis    "all my joints" (11/28/2013)  . Blind left eye   . CAD (coronary artery disease)    a. Prior BMS to OM and RCA, subsequent angioplasty due to ISR in these distributions, also BMS placement with DES to LAD. b. Cath 01/2013: diffuse 3V CAD without clear culprit lesion, normal LVEF, mid LAD lesion noted, treat medically. c. Cath 11/28/2013 DES to mid LAD lesion with abnormal FFR, DES to distal LAD lesion, DES to prox RCA d. 07/24/2018:CABG x4 with LIMA-LAD, SVG-PDA, and Seq-SVG-OM-RI.  [name] Chronic lower back pain   . COPD (chronic obstructive pulmonary disease) ([place])   . DDD (degenerative disc disease)   . Depression   . GERD (gastroesophageal reflux disease)   . Hepatitis C C   "had the tx and I'm free of it"  . History of blood transfusion    "when I was taking tx for hepatitis"  . History of kidney stones   . Hyperlipidemia   . Hypertension   . Hypothyroidism   . Kidney stones    "no surgeries"  . Macular degeneration of both eyes    "already blind in my left eye"  . Myocardial infarction ([place]) ~  2007   . Other and unspecified angina pectoris   . Scoliosis   . Sinus bradycardia     Past Surgical History:  Procedure Laterality Date  . BACK SURGERY     x3 for scoliosis  . CARDIAC CATHETERIZATION     "I've had a few without intervention"  . COLONOSCOPY    . COLONOSCOPY N/A 05/02/2013   Procedure: COLONOSCOPY;  Surgeon: Roy Houston, MD;  [facility];  Service: Endoscopy;  Laterality: N/A;  1030-moved to 1055 Ann to notify pt  . COLONOSCOPY N/A 07/06/2017   Procedure: COLONOSCOPY;  Surgeon: Roy Houston, MD;  [facility];  Service: Endoscopy;  Laterality: N/A;  12:45  . CORONARY ANGIOPLASTY WITH STENT PLACEMENT   2007; 2008; 11/28/2013   "2 + 2; + 3"  . CORONARY ARTERY BYPASS GRAFT N/A 07/24/2018   Procedure: CORONARY ARTERY BYPASS GRAFTING (CABG);  Surgeon: Roy Pollack, MD;  Location: [place];  Service: Open Heart Surgery;  Laterality: N/A;  Times 4 using left internal mammary artery and endoscopically harvested right saphenous vein  . INGUINAL HERNIA REPAIR Bilateral ~ 2000  . LEFT HEART CATH AND CORONARY ANGIOGRAPHY N/A 07/12/2018   Procedure: LEFT HEART CATH AND CORONARY ANGIOGRAPHY;  Surgeon: Roy Booze, MD;  Location: [place] [facility];  Service: Cardiovascular;  Laterality: N/A;  . LEFT HEART CATHETERIZATION WITH CORONARY ANGIOGRAM N/A 01/30/2013   Procedure: LEFT HEART CATHETERIZATION WITH CORONARY ANGIOGRAM;  Surgeon: Roy Artist, MD;  Location: [facility] C[facility];  Service: Cardiovascular;  Laterality: N/A;  . LEFT HEART CATHETERIZATION WITH CORONARY ANGIOGRAM N/A 11/28/2013   Procedure: LEFT HEART CATHETERIZATION WITH CORONARY ANGIOGRAM;  Surgeon: Roy Booze, MD;  Location: [facility] CATH LAB;  Service: Cardiovascular;  Laterality: N/A;  . LIVER BIOPSY  03/24/2010  . POLYPECTOMY  07/06/2017   Procedure: POLYPECTOMY;  Surgeon: Roy Houston, MD;  [facility];  Service: Endoscopy;;  colon  . TEE WITHOUT CARDIOVERSION N/A 07/24/2018   Procedure: TRANSESOPHAGEAL ECHOCARDIOGRAM (TEE);  Surgeon: Roy Pollack, MD;  Location: [place];  Service: Open Heart Surgery;  Laterality: N/A;  . TOOTH EXTRACTION      Current Outpatient Medications  Medication Sig Dispense Refill  . amLODipine (NORVASC) 10 MG tablet Take 10 mg by mouth daily.    [name] aspirin EC 81 MG tablet Take 1 tablet (81 mg total) by mouth daily.    . carvedilol (COREG) 6.25 MG tablet Take 6.25 mg by mouth 2 (two) times daily with a meal.    . cyclobenzaprine (FLEXERIL) 10 MG tablet Take 10 mg by mouth at bedtime.      [name] escitalopram (LEXAPRO) 20 MG tablet Take 20 mg by mouth daily.     . folic acid (FOLVITE) 1 MG tablet Take 1 mg by mouth 2 (two) times daily.    . furosemide (LASIX) 20 MG tablet Take 1 tablet (20 mg total) by mouth daily as needed. For weight gain of 3-5 lbs (Patient taking differently: Take 20 mg by mouth daily. For weight gain of 3-5 lbs) 30 tablet   . gabapentin (NEURONTIN) 300 MG capsule Take 600 mg by mouth 3 (three) times daily.     [name] levothyroxine (SYNTHROID, LEVOTHROID) 75 MCG tablet Take 75 mcg by mouth daily before breakfast.    . lisinopril (ZESTRIL) 5 MG tablet Take 5 mg by mouth every other day.    . methotrexate 2.5 MG tablet Take 20 mg by mouth once  a week. Caution:Chemotherapy. Protect from light.    . nitroGLYCERIN (NITROSTAT) 0.4 MG SL tablet Place 1 tablet (0.4 mg total) under the tongue every 5 (five) minutes x 3 doses as needed. (Patient taking differently: Place 0.4 mg under the tongue every 5 (five) minutes x 3 doses as needed for chest pain. ) 25 tablet 3  . oxyCODONE-acetaminophen (PERCOCET) 10-325 MG tablet Take 1 tablet by mouth every 6 (six) hours as needed for pain.    . potassium chloride (K-DUR) 10 MEQ tablet Take 1 tablet (10 mEq total) by mouth daily as needed. On days you take Lasix (Patient taking differently: Take 10 mEq by mouth daily. On days you take Lasix)    . rosuvastatin (CRESTOR) 20 MG tablet TAKE (1) TABLET BY MOUTH ONCE DAILY. 28 tablet 11  . tamsulosin (FLOMAX) 0.4 MG CAPS capsule Take 0.4 mg by mouth daily.    . traZODone (DESYREL) 100 MG tablet Take 100 mg by mouth at bedtime.     No current facility-administered medications for this visit.   Allergies:  Penicillins, Imdur [isosorbide nitrate], Codeine, and Food   Social History: The patient  reports that he quit smoking about 14 years ago. His smoking use included cigarettes. He started smoking about 48 years ago. He has a 52.50 pack-year smoking history. He has never used smokeless tobacco. He  reports that he does not drink alcohol and does not use drugs.   Family History: The patient's family history includes Diabetes in his brother, brother, and mother; Healthy in his brother, son, and son; Heart disease in his brother.   ROS:  Please see the history of present illness. Otherwise, complete review of systems is positive for none.  All other systems are reviewed and negative.   Physical Exam: VS:  BP 114/66   Pulse 60   Ht 5\' 9"  (1.753 m)   Wt 186 lb 3.2 oz (84.5 kg)   SpO2 96%   BMI 27.50 kg/m , BMI Body mass index is 27.5 kg/m.  Wt Readings from Last 3 Encounters:  03/23/20 186 lb 3.2 oz (84.5 kg)  09/02/19 185 lb (83.9 kg)  07/17/19 180 lb (81.6 kg)    General: Patient appears comfortable at rest. Neck: Supple, no elevated JVP or carotid bruits, no thyromegaly. Lungs: Clear to auscultation, nonlabored breathing at rest. Cardiac: Regular rate and rhythm, no S3 or significant systolic murmur, no pericardial rub. Extremities: No pitting edema, distal pulses 2+. Skin: Warm and dry. Musculoskeletal: No kyphosis. Neuropsychiatric: Alert and oriented x3, affect grossly appropriate.  ECG: March 23, 2020 sinus bradycardia rate of 59, septal infarct, age undetermined  Recent Labwork: 07/18/2019: Hemoglobin 12.0; Platelets 353     Component Value Date/Time   CHOL 208 (H) 04/03/2013 0826   TRIG 156 (H) 04/03/2013 0826   HDL 34 (L) 04/03/2013 0826   CHOLHDL 6.1 04/03/2013 0826   VLDL 31 04/03/2013 0826   LDLCALC 143 (H) 04/03/2013 0826    Other Studies Reviewed Today:  Nuclear stress test 07/29/2019 Narrative & Impression   No diagnostic ST segment changes to indicate ischemia.  Small, mild to moderate intensity, partially reversible apical to basal inferior defect. Partial reversibility most evident in mid to basal segment consistent with ischemic territory.  This is an intermediate risk study.  Nuclear stress EF: 57%.     Cardiac Catheterization:  07/12/2018  Previously placed Prox RCA to Mid RCA stent (unknown type) is widely patent.  Mid LM to Dist LM lesion is 40% stenosed.  Mid RCA lesion is 10% stenosed.  Ost Ramus to Ramus lesion is 95% stenosed.  Ost 1st Mrg to 1st Mrg lesion is 95% stenosed.  Ost LAD lesion is 50% stenosed.  Previously placed Prox LAD stent (unknown type) is widely patent.  Ost 1st Diag to 1st Diag lesion is 99% stenosed.  Ost 2nd Diag to 2nd Diag lesion is 70% stenosed.  Mid LAD-1 lesion is 75% stenosed.  Previously placed Mid LAD-2 stent (unknown type) is widely patent.  Dist RCA lesion is 25% stenosed.  Dist Cx lesion is 90% stenosed.  The left ventricular systolic function is normal.  LV end diastolic pressure is normal.  The left ventricular ejection fraction is 55-65% by visual estimate.  There is no aortic valve stenosis.  Severe multivessel CAD. Multiple branches involved and moderate distal left main and ostial LAD disease as well. Severe mid LAD disease. CTO of large diagonal branch.   When compared to the 2015 cath, there has been significant progression of the mid LAD disease, branch vessel disease and mild progression of the distal left main disease.  Plan for Cardiac surgery consult for CABG.  Diagnostic Dominance: Right     TEE: 07/24/2018  Aortic valve: The valve is trileaflet. No stenosis. No regurgitation.  Right ventricle: Normal cavity size, wall thickness and ejection fraction.  Mitral valve: Trace regurgitation.  Assessment and Plan:  1. CAD in native artery   2. Essential hypertension   3. Hyperlipidemia, mixed    1. CAD in native artery Recent history of four-vessel CABG January 2020 (LIMA-LAD, SVG-PDA, sequential SVG-OM-RI) Subsequent nuclear stress test 07/29/2019 showed mild to moderate intensity, partially reversible apical to basal inferior defect.  Partial reversibility most evident in the mid basal segment are consistent with  ischemic territory.  Considered an intermediate risk study.  Patient was started on Imdur 30 mg daily which she cannot tolerate due to dizziness and feeling near syncopal.  He presents today with a 1 month history of chest pain on and off occurring with and without activity radiating to left arm.  Denies any other associated nausea, vomiting, or diaphoresis.  Dr. Bronson [name] noted in last note if medical therapy did not help symptoms he would opt for cardiac catheterization.  Continue aspirin 81 mg, nitroglycerin sublingual as needed chest pain, carvedilol 6.25 mg p.o. twice daily  2. Essential hypertension Blood pressure well controlled on current therapy.  Continue lisinopril 5 mg daily, Lasix 20 mg p.o. daily.  Carvedilol 6.25 mg p.o. twice daily  3. Hyperlipidemia, mixed Continue Crestor 20 mg p.o. daily.Lipid panel 08/08/2018 triglycerides 141, total cholesterol 164, HDL 26, LDL 111.  Medication Adjustments/Labs and Tests Ordered: Current medicines are reviewed at length with the patient today.  Concerns regarding medicines are outlined above.   Disposition: Follow-up with Dr. Domenic Polite or APP in 3 months.  Signed, Levell July, NP 03/23/2020 2:54 PM    [facility] [place], [place],  [address] Phone: [phone]; Fax: [phone]

## 2020-03-23 ENCOUNTER — Encounter: Payer: Self-pay | Admitting: Family Medicine

## 2020-03-23 ENCOUNTER — Ambulatory Visit: Payer: Medicare HMO | Admitting: Family Medicine

## 2020-03-23 VITALS — BP 114/66 | HR 60 | Ht 69.0 in | Wt 186.2 lb

## 2020-03-23 DIAGNOSIS — I2510 Atherosclerotic heart disease of native coronary artery without angina pectoris: Secondary | ICD-10-CM

## 2020-03-23 DIAGNOSIS — I10 Essential (primary) hypertension: Secondary | ICD-10-CM

## 2020-03-23 DIAGNOSIS — E782 Mixed hyperlipidemia: Secondary | ICD-10-CM

## 2020-03-23 NOTE — Patient Instructions (Signed)
Medication Instructions:  Increase Imdur to 60mg  daily.   Will send a one time refill to pharmacy of 30 mg tabs to be used with what he already has in his bubble pack.  Will send regular refill to be used in next months bubble pack of the 60mg  tablet. Continue all other medications.    Labwork: none  Testing/Procedures:  PENDING - Your physician has requested that you have a cardiac catheterization. Cardiac catheterization is used to diagnose and/or treat various heart conditions. Doctors may recommend this procedure for a number of different reasons. The most common reason is to evaluate chest pain. Chest pain can be a symptom of coronary artery disease (CAD), and cardiac catheterization can show whether plaque is narrowing or blocking your heart's arteries. This procedure is also used to evaluate the valves, as well as measure the blood flow and oxygen levels in different parts of your heart. For further information please visit [facility]. Please follow instruction sheet, as given.  OFFICE WILL CALL   Follow-Up: Pending   Any Other Special Instructions Will Be Listed Below (If Applicable).  If you need a refill on your cardiac medications before your next appointment, please call your pharmacy.

## 2020-03-24 NOTE — Progress Notes (Signed)
Felipa Eth I spoke to Dr. Domenic Polite about Mr. Seliga cardiac catheterization.  He thinks we should retry him on a lower dose of Imdur 15 mg and stop his lisinopril first and if he continues to have symptoms then we would likely do a cardiac catheterization.  Tell Mr. Holston 60s had basically four-vessel bypass there is likely not much diuretic to try to revascularize if this is true anginal pain.  In that case doing a cardiac catheterization and may not be of any real benefit per Dr. Myles Gip statement.  Tell him we will try this first and if this does not suffice then we will go the route of having a cardiac catheterization.  Thank you.  So to stop his lisinopril start him on 15 mg of Imdur versus the 30 he was on and get him back in here in a month or 2.  Thanks

## 2020-03-26 ENCOUNTER — Telehealth: Payer: Self-pay | Admitting: Family Medicine

## 2020-03-26 NOTE — Telephone Encounter (Signed)
Pt called wanting to know the status of scheduling his Cath  [phone]

## 2020-03-27 MED ORDER — ISOSORBIDE MONONITRATE ER 30 MG PO TB24: 15.0000 mg | ORAL_TABLET | Freq: Every day | ORAL | 6 refills | Status: DC

## 2020-03-27 NOTE — Progress Notes (Signed)
Patient notified and verbalized understanding.  Follow up scheduled for 04/20/2020 at 3:30 with Katina Dung, NP.  New medication sent to [place].

## 2020-03-27 NOTE — Telephone Encounter (Signed)
Left message to return call 

## 2020-03-27 NOTE — Telephone Encounter (Signed)
Laurine Blazer, LPN at 0/38/3338 3:29 PM  Status: Signed    Patient notified and verbalized understanding.  Follow up scheduled for 04/20/2020 at 3:30 with Katina Dung, NP.  New medication sent to [facility].     Verta Ellen., NP at 03/23/2020 2:30 PM  Status: Signed    Felipa Eth I spoke to Dr. Domenic Polite about Mr. Sherk cardiac catheterization.  He thinks we should retry him on a lower dose of Imdur 15 mg and stop his lisinopril first and if he continues to have symptoms then we would likely do a cardiac catheterization.  Tell Mr. Aaberg 60s had basically four-vessel bypass there is likely not much diuretic to try to revascularize if this is true anginal pain.  In that case doing a cardiac catheterization and may not be of any real benefit per Dr. Myles Gip statement.  Tell him we will try this first and if this does not suffice then we will go the route of having a cardiac catheterization.  Thank you.  So to stop his lisinopril start him on 15 mg of Imdur versus the 30 he was on and get him back in here in a month or 2.  Thanks

## 2020-03-27 NOTE — Telephone Encounter (Signed)
Call placed to [facility] since he bubble packs his medications.  Informed her of the medication changes.

## 2020-04-19 NOTE — Progress Notes (Deleted)
Cardiology Office Note  Date: 04/19/2020   ID: Roy [name], Roy [name] 27-Jul-1955, MRN [id number]  PCP:  Monico Blitz, MD  Cardiologist:  No primary care provider on file. Electrophysiologist:  None   Chief Complaint: Follow-up CAD  History of Present Illness: Roy [name] is a 64 y.o. male with a history of CAD with four-vessel CABG January 2020 (LIMA-LAD, SVG-PDA, sequential SVG-OM-RI).  Other history includes COPD, hep C, HLD, HTN, hypothyroidism, arthritis, GERD, chronic lower back pain.  Nuclear stress test 07/29/2019 showed reversible defect in apical to basal inferior wall suggestive of ischemia, EF 57%.  Intermediate study. In Dr. Court Joy note on 07/30/2019 after examining the stress test results he stated he would add Imdur to optimize medical therapy.  If he was unable to improve symptoms with guideline directed medical therapy he would then arrange for cardiac catheterization.  Last saw Dr. Bronson Ing 09/11/2019 via telemedicine.  Patient stated after he had been taking the Imdur his symptoms of shortness of breath had resolved.  He then started feeling lightheaded and felt like he was going to pass out when he would stand up.  Stated he had run out of the Imdur and symptoms had resolved.  He no longer had chest pain.  He was continuing aspirin, beta-blocker, and statin.  His aspirin was reduced to 81 mg.  There were no changes to his antihypertensive therapy and he was continuing statin medications.   Patient arrives today for 62-month follow-up.  He states he has been having chest pain on and off over the last month.  States the chest pain can occur with and without activity.  States he does have some radiation to left arm when it occurs.  He denies any associated nausea, vomiting, or diaphoresis.  Dr. Bronson Ing had mentioned earlier after examining stress test if unable to improve symptoms with medical therapy would likely have the patient undergo a cardiac catheterization.  He  tried patient on Imdur 30 mg daily.  Patient was having some issues with feeling lightheaded and near syncopal.  The patient stopped the medication on his own.  Patient states he would like definitive therapy in the form of a cardiac catheterization.   Past Medical History:  Diagnosis Date  . Abdominal pain, right upper quadrant   . Arthritis    "all my joints" (11/28/2013)  . Blind left eye   . CAD (coronary artery disease)    a. Prior BMS to OM and RCA, subsequent angioplasty due to ISR in these distributions, also BMS placement with DES to LAD. b. Cath 01/2013: diffuse 3V CAD without clear culprit lesion, normal LVEF, mid LAD lesion noted, treat medically. c. Cath 11/28/2013 DES to mid LAD lesion with abnormal FFR, DES to distal LAD lesion, DES to prox RCA d. 07/24/2018:CABG x4 with LIMA-LAD, SVG-PDA, and Seq-SVG-OM-RI.  [name] Chronic lower back pain   . COPD (chronic obstructive pulmonary disease) ([place])   . DDD (degenerative disc disease)   . Depression   . GERD (gastroesophageal reflux disease)   . Hepatitis C C   "had the tx and I'm free of it"  . History of blood transfusion    "when I was taking tx for hepatitis"  . History of kidney stones   . Hyperlipidemia   . Hypertension   . Hypothyroidism   . Kidney stones    "no surgeries"  . Macular degeneration of both eyes    "already blind in my left eye"  . Myocardial infarction ([place]) ~  2007   . Other and unspecified angina pectoris   . Scoliosis   . Sinus bradycardia     Past Surgical History:  Procedure Laterality Date  . BACK SURGERY     x3 for scoliosis  . CARDIAC CATHETERIZATION     "I've had a few without intervention"  . COLONOSCOPY    . COLONOSCOPY N/A 05/02/2013   Procedure: COLONOSCOPY;  Surgeon: Rogene Houston, MD;  [facility];  Service: Endoscopy;  Laterality: N/A;  1030-moved to 1055 Ann to notify pt  . COLONOSCOPY N/A 07/06/2017   Procedure: COLONOSCOPY;  Surgeon: Rogene Houston, MD;  Location: [facility];  Service: Endoscopy;  Laterality: N/A;  12:45  . CORONARY ANGIOPLASTY WITH STENT PLACEMENT   2007; 2008; 11/28/2013   "2 + 2; + 3"  . CORONARY ARTERY BYPASS GRAFT N/A 07/24/2018   Procedure: CORONARY ARTERY BYPASS GRAFTING (CABG);  Surgeon: Gaye Pollack, MD;  Location: [facility];  Service: Open Heart Surgery;  Laterality: N/A;  Times 4 using left internal mammary artery and endoscopically harvested right saphenous vein  . INGUINAL HERNIA REPAIR Bilateral ~ 2000  . LEFT HEART CATH AND CORONARY ANGIOGRAPHY N/A 07/12/2018   Procedure: LEFT HEART CATH AND CORONARY ANGIOGRAPHY;  Surgeon: Jettie Booze, MD;  Location: [facility];  Service: Cardiovascular;  Laterality: N/A;  . LEFT HEART CATHETERIZATION WITH CORONARY ANGIOGRAM N/A 01/30/2013   Procedure: LEFT HEART CATHETERIZATION WITH CORONARY ANGIOGRAM;  Surgeon: Jolaine Artist, MD;  Location: [facility] CATH LAB;  Service: Cardiovascular;  Laterality: N/A;  . LEFT HEART CATHETERIZATION WITH CORONARY ANGIOGRAM N/A 11/28/2013   Procedure: LEFT HEART CATHETERIZATION WITH CORONARY ANGIOGRAM;  Surgeon: Jettie Booze, MD;  Location: [facility] CATH LAB;  Service: Cardiovascular;  Laterality: N/A;  . LIVER BIOPSY  03/24/2010  . POLYPECTOMY  07/06/2017   Procedure: POLYPECTOMY;  Surgeon: Rogene Houston, MD;  [facility];  Service: Endoscopy;;  colon  . TEE WITHOUT CARDIOVERSION N/A 07/24/2018   Procedure: TRANSESOPHAGEAL ECHOCARDIOGRAM (TEE);  Surgeon: Gaye Pollack, MD;  Location: [facility];  Service: Open Heart Surgery;  Laterality: N/A;  . TOOTH EXTRACTION      Current Outpatient Medications  Medication Sig Dispense Refill  . amLODipine (NORVASC) 10 MG tablet Take 10 mg by mouth daily.    [name] aspirin EC 81 MG tablet Take 1 tablet (81 mg total) by mouth daily.    . carvedilol (COREG) 6.25 MG tablet Take 6.25 mg by mouth 2 (two) times daily with a meal.    . cyclobenzaprine (FLEXERIL) 10 MG tablet Take 10 mg by mouth at bedtime.      [name] escitalopram (LEXAPRO) 20 MG tablet Take 20 mg by mouth daily.     . folic acid (FOLVITE) 1 MG tablet Take 1 mg by mouth 2 (two) times daily.    . furosemide (LASIX) 20 MG tablet Take 1 tablet (20 mg total) by mouth daily as needed. For weight gain of 3-5 lbs (Patient taking differently: Take 20 mg by mouth daily. For weight gain of 3-5 lbs) 30 tablet   . gabapentin (NEURONTIN) 300 MG capsule Take 600 mg by mouth 3 (three) times daily.     . isosorbide mononitrate (IMDUR) 30 MG 24 hr tablet Take 0.5 tablets (15 mg total) by mouth daily. 15 tablet 6  . levothyroxine (SYNTHROID, LEVOTHROID) 75 MCG tablet Take 75 mcg by mouth daily before breakfast.    . methotrexate 2.5 MG tablet Take  20 mg by mouth once a week. Caution:Chemotherapy. Protect from light.    . nitroGLYCERIN (NITROSTAT) 0.4 MG SL tablet Place 1 tablet (0.4 mg total) under the tongue every 5 (five) minutes x 3 doses as needed. (Patient taking differently: Place 0.4 mg under the tongue every 5 (five) minutes x 3 doses as needed for chest pain. ) 25 tablet 3  . oxyCODONE-acetaminophen (PERCOCET) 10-325 MG tablet Take 1 tablet by mouth every 6 (six) hours as needed for pain.    . potassium chloride (K-DUR) 10 MEQ tablet Take 1 tablet (10 mEq total) by mouth daily as needed. On days you take Lasix (Patient taking differently: Take 10 mEq by mouth daily. On days you take Lasix)    . rosuvastatin (CRESTOR) 20 MG tablet TAKE (1) TABLET BY MOUTH ONCE DAILY. 28 tablet 11  . tamsulosin (FLOMAX) 0.4 MG CAPS capsule Take 0.4 mg by mouth daily.    . traZODone (DESYREL) 100 MG tablet Take 100 mg by mouth at bedtime.     No current facility-administered medications for this visit.   Allergies:  Penicillins, Imdur [isosorbide nitrate], Codeine, and Food   Social History: The patient  reports that he quit smoking about 14 years ago. His smoking use included cigarettes. He started smoking about 48 years ago. He has a 52.50 pack-year smoking history.  He has never used smokeless tobacco. He reports that he does not drink alcohol and does not use drugs.   Family History: The patient's family history includes Diabetes in his brother, brother, and mother; Healthy in his brother, son, and son; Heart disease in his brother.   ROS:  Please see the history of present illness. Otherwise, complete review of systems is positive for none.  All other systems are reviewed and negative.   Physical Exam: VS:  There were no vitals taken for this visit., BMI There is no height or weight on file to calculate BMI.  Wt Readings from Last 3 Encounters:  03/23/20 186 lb 3.2 oz (84.5 kg)  09/02/19 185 lb (83.9 kg)  07/17/19 180 lb (81.6 kg)    General: Patient appears comfortable at rest. Neck: Supple, no elevated JVP or carotid bruits, no thyromegaly. Lungs: Clear to auscultation, nonlabored breathing at rest. Cardiac: Regular rate and rhythm, no S3 or significant systolic murmur, no pericardial rub. Extremities: No pitting edema, distal pulses 2+. Skin: Warm and dry. Musculoskeletal: No kyphosis. Neuropsychiatric: Alert and oriented x3, affect grossly appropriate.  ECG: March 23, 2020 sinus bradycardia rate of 59, septal infarct, age undetermined  Recent Labwork: 07/18/2019: Hemoglobin 12.0; Platelets 353     Component Value Date/Time   CHOL 208 (H) 04/03/2013 0826   TRIG 156 (H) 04/03/2013 0826   HDL 34 (L) 04/03/2013 0826   CHOLHDL 6.1 04/03/2013 0826   VLDL 31 04/03/2013 0826   LDLCALC 143 (H) 04/03/2013 0826    Other Studies Reviewed Today:  Nuclear stress test 07/29/2019 Narrative & Impression   No diagnostic ST segment changes to indicate ischemia.  Small, mild to moderate intensity, partially reversible apical to basal inferior defect. Partial reversibility most evident in mid to basal segment consistent with ischemic territory.  This is an intermediate risk study.  Nuclear stress EF: 57%.     Cardiac Catheterization:  07/12/2018  Previously placed Prox RCA to Mid RCA stent (unknown type) is widely patent.  Mid LM to Dist LM lesion is 40% stenosed.  Mid RCA lesion is 10% stenosed.  Ost Ramus to Ramus lesion is  95% stenosed.  Ost 1st Mrg to 1st Mrg lesion is 95% stenosed.  Ost LAD lesion is 50% stenosed.  Previously placed Prox LAD stent (unknown type) is widely patent.  Ost 1st Diag to 1st Diag lesion is 99% stenosed.  Ost 2nd Diag to 2nd Diag lesion is 70% stenosed.  Mid LAD-1 lesion is 75% stenosed.  Previously placed Mid LAD-2 stent (unknown type) is widely patent.  Dist RCA lesion is 25% stenosed.  Dist Cx lesion is 90% stenosed.  The left ventricular systolic function is normal.  LV end diastolic pressure is normal.  The left ventricular ejection fraction is 55-65% by visual estimate.  There is no aortic valve stenosis.  Severe multivessel CAD. Multiple branches involved and moderate distal left main and ostial LAD disease as well. Severe mid LAD disease. CTO of large diagonal branch.   When compared to the 2015 cath, there has been significant progression of the mid LAD disease, branch vessel disease and mild progression of the distal left main disease.  Plan for Cardiac surgery consult for CABG.  Diagnostic Dominance: Right     TEE: 07/24/2018  Aortic valve: The valve is trileaflet. No stenosis. No regurgitation.  Right ventricle: Normal cavity size, wall thickness and ejection fraction.  Mitral valve: Trace regurgitation.  Assessment and Plan:   1. CAD in native artery Recent history of four-vessel CABG January 2020 (LIMA-LAD, SVG-PDA, sequential SVG-OM-RI) Subsequent nuclear stress test 07/29/2019 showed mild to moderate intensity, partially reversible apical to basal inferior defect.  Partial reversibility most evident in the mid basal segment are consistent with ischemic territory.  Considered an intermediate risk study.  Patient was started on Imdur  30 mg daily which he could not tolerate due to dizziness and feeling near syncopal.  He presented today with a 1 month history of chest pain on and off occurring with and without activity radiating to left arm.  Denied any other associated nausea, vomiting, or diaphoresis.  Dr. Bronson Ing noted in last note if medical therapy did not help symptoms he would opt for cardiac catheterization.  Continue aspirin 81 mg, nitroglycerin sublingual as needed chest pain, carvedilol 6.25 mg p.o. twice daily  2. Essential hypertension Blood pressure well controlled on current therapy.  Continue lisinopril 5 mg daily, Lasix 20 mg p.o. daily.  Carvedilol 6.25 mg p.o. twice daily  3. Hyperlipidemia, mixed Continue Crestor 20 mg p.o. daily.Lipid panel 08/08/2018 triglycerides 141, total cholesterol 164, HDL 26, LDL 111.  Medication Adjustments/Labs and Tests Ordered: Current medicines are reviewed at length with the patient today.  Concerns regarding medicines are outlined above.   Disposition: Follow-up with Dr. Domenic Polite or APP   Signed, Levell July, NP 04/19/2020 10:50 PM    [facility], [place], [place] [address] Phone: [phone]; Fax: [phone]

## 2020-04-20 ENCOUNTER — Ambulatory Visit: Payer: Medicare HMO | Admitting: Family Medicine

## 2020-05-02 DIAGNOSIS — Z87891 Personal history of nicotine dependence: Secondary | ICD-10-CM | POA: Diagnosis not present

## 2020-05-02 DIAGNOSIS — G8929 Other chronic pain: Secondary | ICD-10-CM | POA: Diagnosis not present

## 2020-05-02 DIAGNOSIS — R9431 Abnormal electrocardiogram [ECG] [EKG]: Secondary | ICD-10-CM | POA: Diagnosis not present

## 2020-05-02 DIAGNOSIS — I2510 Atherosclerotic heart disease of native coronary artery without angina pectoris: Secondary | ICD-10-CM | POA: Diagnosis not present

## 2020-05-02 DIAGNOSIS — R079 Chest pain, unspecified: Secondary | ICD-10-CM | POA: Diagnosis not present

## 2020-05-02 DIAGNOSIS — I10 Essential (primary) hypertension: Secondary | ICD-10-CM | POA: Diagnosis not present

## 2020-05-02 DIAGNOSIS — I252 Old myocardial infarction: Secondary | ICD-10-CM | POA: Diagnosis not present

## 2020-05-10 NOTE — Progress Notes (Signed)
Cardiology Office Note  Date: 05/11/2020   ID: Roy, [name] 21-Aug-1955, MRN [id number]  PCP:  Monico Blitz, MD  Cardiologist:  No primary care provider on file. Electrophysiologist:  None   Chief Complaint: Follow-up CAD  History of Present Illness: Roy [name] is a 64 y.o. male with a history of CAD with four-vessel CABG January 2020 (LIMA-LAD, SVG-PDA, sequential SVG-OM-RI).  Other history includes COPD, hep C, HLD, HTN, hypothyroidism, arthritis, GERD, chronic lower back pain.  Nuclear stress test 07/29/2019 showed reversible defect in apical to basal inferior wall suggestive of ischemia, EF 57%.  Intermediate study. In Dr. Court Joy note on 07/30/2019 after examining the stress test results he stated he would add Imdur to optimize medical therapy.  If he was unable to improve symptoms with guideline directed medical therapy he would then arrange for cardiac catheterization.  Last saw Dr. Bronson Ing 09/11/2019 via telemedicine.  Patient stated after he had been taking the Imdur his symptoms of shortness of breath had resolved.  He then started feeling lightheaded and felt like he was going to pass out when he would stand up.  Stated he had run out of the Imdur and symptoms had resolved.  He no longer had chest pain.  He was continuing aspirin, beta-blocker, and statin.  His aspirin was reduced to 81 mg.  There were no changes to his antihypertensive therapy and he was continuing statin medications.   Last visit here for 42-month follow-up on March 23, 2020.  He had been having chest pain on and off over the last month.  Stated the chest pain could occur with and without activity.  Stated he did have some radiation to left arm when it occurred.  He denied any associated nausea, vomiting, or diaphoresis.  Dr. Bronson Ing had mentioned earlier after examining stress test if unable to improve symptoms with medical therapy would likely have the patient undergo a cardiac  catheterization.  He tried patient on Imdur 30 mg daily.  Patient was having some issues with feeling lightheaded and near syncopal.  The patient stopped the medication on his own.  Patient stated he would like definitive therapy in the form of a cardiac catheterization.  Patient presented to [facility] ED on 05/02/2020 for chest pain.  Chest pain began the prior day somewhat relieved with nitroglycerin.  The chest pain returned and he presented to the emergency room the next day.  His chest pain improved with Nitropaste and Toradol.  His EKG showed minor anterior T wave inversions with no prior for comparison.  High-sensitivity troponin were negative x 3.  BNP 481.  Patient is here today stating he has chest pain on and off which is brief, described as stabbing-like pain without radiation to neck, arm, back, jaw.  No associated nausea, vomiting, diaphoresis.  States it is not always associated with exertion and can occur at random.  Denies any orthostatic symptoms, CVA or TIA-like symptoms, palpitations or arrhythmias, PND, orthopnea.  No bleeding.  No claudication-like symptoms, DVT or PE-like symptoms.  No lower extremity edema.  Past Medical History:  Diagnosis Date  . Abdominal pain, right upper quadrant   . Arthritis    "all my joints" (11/28/2013)  . Blind left eye   . CAD (coronary artery disease)    a. Prior BMS to OM and RCA, subsequent angioplasty due to ISR in these distributions, also BMS placement with DES to LAD. b. Cath 01/2013: diffuse 3V CAD without clear culprit lesion, normal LVEF,  mid LAD lesion noted, treat medically. c. Cath 11/28/2013 DES to mid LAD lesion with abnormal FFR, DES to distal LAD lesion, DES to prox RCA d. 07/24/2018:CABG x4 with LIMA-LAD, SVG-PDA, and Seq-SVG-OM-RI.  [name] Chronic lower back pain   . COPD (chronic obstructive pulmonary disease) ([place])   . DDD (degenerative disc disease)   . Depression   . GERD (gastroesophageal reflux disease)   . Hepatitis C C    "had the tx and I'm free of it"  . History of blood transfusion    "when I was taking tx for hepatitis"  . History of kidney stones   . Hyperlipidemia   . Hypertension   . Hypothyroidism   . Kidney stones    "no surgeries"  . Macular degeneration of both eyes    "already blind in my left eye"  . Myocardial infarction ([place]) ~ 2007   . Other and unspecified angina pectoris   . Scoliosis   . Sinus bradycardia     Past Surgical History:  Procedure Laterality Date  . BACK SURGERY     x3 for scoliosis  . CARDIAC CATHETERIZATION     "I've had a few without intervention"  . COLONOSCOPY    . COLONOSCOPY N/A 05/02/2013   Procedure: COLONOSCOPY;  Surgeon: Rogene Houston, MD;  [facility];  Service: Endoscopy;  Laterality: N/A;  1030-moved to 1055 Ann to notify pt  . COLONOSCOPY N/A 07/06/2017   Procedure: COLONOSCOPY;  Surgeon: Rogene Houston, MD;  Location: [facility];  Service: Endoscopy;  Laterality: N/A;  12:45  . CORONARY ANGIOPLASTY WITH STENT PLACEMENT   2007; 2008; 11/28/2013   "2 + 2; + 3"  . CORONARY ARTERY BYPASS GRAFT N/A 07/24/2018   Procedure: CORONARY ARTERY BYPASS GRAFTING (CABG);  Surgeon: Gaye Pollack, MD;  Location: [facility];  Service: Open Heart Surgery;  Laterality: N/A;  Times 4 using left internal mammary artery and endoscopically harvested right saphenous vein  . INGUINAL HERNIA REPAIR Bilateral ~ 2000  . LEFT HEART CATH AND CORONARY ANGIOGRAPHY N/A 07/12/2018   Procedure: LEFT HEART CATH AND CORONARY ANGIOGRAPHY;  Surgeon: Jettie Booze, MD;  Location: [facility];  Service: Cardiovascular;  Laterality: N/A;  . LEFT HEART CATHETERIZATION WITH CORONARY ANGIOGRAM N/A 01/30/2013   Procedure: LEFT HEART CATHETERIZATION WITH CORONARY ANGIOGRAM;  Surgeon: Jolaine Artist, MD;  Location: [facility] C[facility];  Service: Cardiovascular;  Laterality: N/A;  . LEFT HEART CATHETERIZATION WITH CORONARY ANGIOGRAM N/A 11/28/2013   Procedure: LEFT HEART  CATHETERIZATION WITH CORONARY ANGIOGRAM;  Surgeon: Jettie Booze, MD;  Location: [facility] CATH LAB;  Service: Cardiovascular;  Laterality: N/A;  . LIVER BIOPSY  03/24/2010  . POLYPECTOMY  07/06/2017   Procedure: POLYPECTOMY;  Surgeon: Rogene Houston, MD;  [facility];  Service: Endoscopy;;  colon  . TEE WITHOUT CARDIOVERSION N/A 07/24/2018   Procedure: TRANSESOPHAGEAL ECHOCARDIOGRAM (TEE);  Surgeon: Gaye Pollack, MD;  Location: [facility];  Service: Open Heart Surgery;  Laterality: N/A;  . TOOTH EXTRACTION      Current Outpatient Medications  Medication Sig Dispense Refill  . amLODipine (NORVASC) 10 MG tablet Take 10 mg by mouth daily.    [name] aspirin 325 MG tablet Take 325 mg by mouth daily.    . carvedilol (COREG) 6.25 MG tablet Take 6.25 mg by mouth 2 (two) times daily with a meal.    . cyclobenzaprine (FLEXERIL) 10 MG tablet Take 10 mg by mouth at bedtime.     [name]  escitalopram (LEXAPRO) 20 MG tablet Take 20 mg by mouth daily.     . furosemide (LASIX) 20 MG tablet Take 1 tablet (20 mg total) by mouth daily as needed. For weight gain of 3-5 lbs (Patient taking differently: Take 20 mg by mouth daily. For weight gain of 3-5 lbs) 30 tablet   . gabapentin (NEURONTIN) 300 MG capsule Take 600 mg by mouth 3 (three) times daily.     . isosorbide mononitrate (IMDUR) 30 MG 24 hr tablet Take 0.5 tablets (15 mg total) by mouth daily. 15 tablet 6  . levothyroxine (SYNTHROID, LEVOTHROID) 75 MCG tablet Take 75 mcg by mouth daily before breakfast.    . methotrexate 2.5 MG tablet Take 20 mg by mouth once a week. Caution:Chemotherapy. Protect from light.    . nitroGLYCERIN (NITROSTAT) 0.4 MG SL tablet Place 1 tablet (0.4 mg total) under the tongue every 5 (five) minutes x 3 doses as needed. (Patient taking differently: Place 0.4 mg under the tongue every 5 (five) minutes x 3 doses as needed for chest pain. ) 25 tablet 3  . oxyCODONE-acetaminophen (PERCOCET) 10-325 MG tablet Take 1 tablet by mouth every 6  (six) hours as needed for pain.    . potassium chloride (K-DUR) 10 MEQ tablet Take 1 tablet (10 mEq total) by mouth daily as needed. On days you take Lasix (Patient taking differently: Take 10 mEq by mouth daily. On days you take Lasix)    . rosuvastatin (CRESTOR) 20 MG tablet TAKE (1) TABLET BY MOUTH ONCE DAILY. 28 tablet 11  . tamsulosin (FLOMAX) 0.4 MG CAPS capsule Take 0.4 mg by mouth daily.    . traZODone (DESYREL) 100 MG tablet Take 100 mg by mouth at bedtime.     No current facility-administered medications for this visit.   Allergies:  Penicillins, Imdur [isosorbide nitrate], Codeine, and Food   Social History: The patient  reports that he quit smoking about 14 years ago. His smoking use included cigarettes. He started smoking about 48 years ago. He has a 52.50 pack-year smoking history. He has never used smokeless tobacco. He reports that he does not drink alcohol and does not use drugs.   Family History: The patient's family history includes Diabetes in his brother, brother, and mother; Healthy in his brother, son, and son; Heart disease in his brother.   ROS:  Please see the history of present illness. Otherwise, complete review of systems is positive for none.  All other systems are reviewed and negative.   Physical Exam: VS:  BP 118/70   Pulse 62   Ht 5\' 9"  (1.753 m)   Wt 184 lb (83.5 kg)   SpO2 98%   BMI 27.17 kg/m , BMI Body mass index is 27.17 kg/m.  Wt Readings from Last 3 Encounters:  05/11/20 184 lb (83.5 kg)  03/23/20 186 lb 3.2 oz (84.5 kg)  09/02/19 185 lb (83.9 kg)    General: Patient appears comfortable at rest. Neck: Supple, no elevated JVP or carotid bruits, no thyromegaly. Lungs: Clear to auscultation, nonlabored breathing at rest. Cardiac: Regular rate and rhythm, no S3 or significant systolic murmur, no pericardial rub. Extremities: No pitting edema, distal pulses 2+. Skin: Warm and dry. Musculoskeletal: No kyphosis. Neuropsychiatric: Alert and  oriented x3, affect grossly appropriate.  ECG: March 23, 2020 sinus bradycardia rate of 59, septal infarct, age undetermined  Recent Labwork: 07/18/2019: Hemoglobin 12.0; Platelets 353     Component Value Date/Time   CHOL 208 (H) 04/03/2013 1610  TRIG 156 (H) 04/03/2013 0826   HDL 34 (L) 04/03/2013 0826   CHOLHDL 6.1 04/03/2013 0826   VLDL 31 04/03/2013 0826   LDLCALC 143 (H) 04/03/2013 0826    Other Studies Reviewed Today:  Nuclear stress test 07/29/2019 Narrative & Impression   No diagnostic ST segment changes to indicate ischemia.  Small, mild to moderate intensity, partially reversible apical to basal inferior defect. Partial reversibility most evident in mid to basal segment consistent with ischemic territory.  This is an intermediate risk study.  Nuclear stress EF: 57%.     Cardiac Catheterization: 07/12/2018  Previously placed Prox RCA to Mid RCA stent (unknown type) is widely patent.  Mid LM to Dist LM lesion is 40% stenosed.  Mid RCA lesion is 10% stenosed.  Ost Ramus to Ramus lesion is 95% stenosed.  Ost 1st Mrg to 1st Mrg lesion is 95% stenosed.  Ost LAD lesion is 50% stenosed.  Previously placed Prox LAD stent (unknown type) is widely patent.  Ost 1st Diag to 1st Diag lesion is 99% stenosed.  Ost 2nd Diag to 2nd Diag lesion is 70% stenosed.  Mid LAD-1 lesion is 75% stenosed.  Previously placed Mid LAD-2 stent (unknown type) is widely patent.  Dist RCA lesion is 25% stenosed.  Dist Cx lesion is 90% stenosed.  The left ventricular systolic function is normal.  LV end diastolic pressure is normal.  The left ventricular ejection fraction is 55-65% by visual estimate.  There is no aortic valve stenosis.  Severe multivessel CAD. Multiple branches involved and moderate distal left main and ostial LAD disease as well. Severe mid LAD disease. CTO of large diagonal branch.   When compared to the 2015 cath, there has been significant  progression of the mid LAD disease, branch vessel disease and mild progression of the distal left main disease.  Plan for Cardiac surgery consult for CABG.  Diagnostic Dominance: Right     TEE: 07/24/2018  Aortic valve: The valve is trileaflet. No stenosis. No regurgitation.  Right ventricle: Normal cavity size, wall thickness and ejection fraction.  Mitral valve: Trace regurgitation.  Assessment and Plan:   1. CAD in native artery Recent history of four-vessel CABG January 2020 (LIMA-LAD, SVG-PDA, sequential SVG-OM-RI) Subsequent nuclear stress test 07/29/2019 showed mild to moderate intensity, partially reversible apical to basal inferior defect.  Partial reversibility most evident in the mid basal segment are consistent with ischemic territory.  Considered an intermediate risk study.  Patient was started on Imdur 30 mg daily which he could not tolerate due to dizziness and feeling near syncopal.   He presented prior visit on 03/23/2020 with a 1 month history of chest pain on and off occurring with and without activity radiating to left arm.  Denied any other associated nausea, vomiting, or diaphoresis.  Dr. Bronson Ing noted in last note if medical therapy did not help symptoms he would opt for cardiac catheterization.   Recent presentation to [facility] with chest pain on 05/02/2020. He was given Nitropaste and Toradol.  Troponins were negative x3.  Continue aspirin 81 mg, nitroglycerin sublingual as needed chest pain, carvedilol 6.25 mg p.o. twice daily.  Continue Imdur 15 mg p.o. daily.  Will discuss case with Dr. Harl Bowie to determine whether we need to proceed with a cardiac catheterization per Dr. Court Joy plan earlier in the year.  2. Essential hypertension Blood pressure well controlled on current therapy.  Continue amlodipine 10 mg daily.  Lasix 20 mg p.o. daily.  Carvedilol 6.25 mg p.o. twice  daily  3. Hyperlipidemia, mixed Continue Crestor 20 mg p.o. daily.Lipid  panel 08/08/2018 triglycerides 141, total cholesterol 164, HDL 26, LDL 111.  Medication Adjustments/Labs and Tests Ordered: Current medicines are reviewed at length with the patient today.  Concerns regarding medicines are outlined above.   Disposition: Follow-up with Dr. Harl Bowie or APP 3 months  Signed, Levell July, NP 05/11/2020 2:55 PM    [facility], [place], [place] [address] Phone: [phone]; Fax: [phone]

## 2020-05-11 ENCOUNTER — Encounter: Payer: Self-pay | Admitting: *Deleted

## 2020-05-11 ENCOUNTER — Encounter: Payer: Self-pay | Admitting: Family Medicine

## 2020-05-11 ENCOUNTER — Ambulatory Visit: Payer: Medicare HMO | Admitting: Family Medicine

## 2020-05-11 VITALS — BP 118/70 | HR 62 | Ht 69.0 in | Wt 184.0 lb

## 2020-05-11 DIAGNOSIS — E782 Mixed hyperlipidemia: Secondary | ICD-10-CM | POA: Diagnosis not present

## 2020-05-11 DIAGNOSIS — R079 Chest pain, unspecified: Secondary | ICD-10-CM

## 2020-05-11 DIAGNOSIS — I2510 Atherosclerotic heart disease of native coronary artery without angina pectoris: Secondary | ICD-10-CM | POA: Diagnosis not present

## 2020-05-11 DIAGNOSIS — I10 Essential (primary) hypertension: Secondary | ICD-10-CM

## 2020-05-11 NOTE — Patient Instructions (Addendum)
Medication Instructions:  Your physician recommends that you continue on your current medications as directed. Please refer to the Current Medication list given to you today.   Labwork: None  Testing/Procedures: None  Follow-Up: Your physician recommends that you schedule a follow-up appointment in: 3 months  Any Other Special Instructions Will Be Listed Below (If Applicable).     If you need a refill on your cardiac medications before your next appointment, please call your pharmacy.   

## 2020-05-12 DIAGNOSIS — R030 Elevated blood-pressure reading, without diagnosis of hypertension: Secondary | ICD-10-CM | POA: Diagnosis not present

## 2020-05-12 DIAGNOSIS — M461 Sacroiliitis, not elsewhere classified: Secondary | ICD-10-CM | POA: Diagnosis not present

## 2020-05-12 DIAGNOSIS — Z6826 Body mass index (BMI) 26.0-26.9, adult: Secondary | ICD-10-CM | POA: Diagnosis not present

## 2020-05-13 NOTE — Progress Notes (Signed)
Would follow closely since starting imdur, room to titrate as tolerated. If ongoing symptoms or progression would consider cath then   Zandra Abts MD

## 2020-05-21 ENCOUNTER — Telehealth: Payer: Self-pay | Admitting: *Deleted

## 2020-05-21 NOTE — Telephone Encounter (Signed)
Says he was told at last visit that he would be contacted to arrange heart cath after provider spoke with Dr. Harl Bowie

## 2020-05-22 NOTE — Telephone Encounter (Signed)
I spoke to Dr. Harl Bowie regarding his issue.  He recently had bypass surgery.  Dr. Harl Bowie reviewed his medical record and concluded there are likely no targets for intervention due to his bypass surgery.  Dr. Harl Bowie stated it would be best to manage his chest pain with medicines.  Just let him know there are likely no new blockages given the fact that he recently had bypass surgery which bypassed the blockages he already had.[name]

## 2020-05-22 NOTE — Telephone Encounter (Signed)
New message     Roy [name] returning call to [place]

## 2020-05-22 NOTE — Telephone Encounter (Signed)
Patient's wife informed and verbalized understanding of plan. ] Aware to contact office if imdur 15 mg not controlling symptoms and we could increase to 30 mg.

## 2020-05-26 DIAGNOSIS — J449 Chronic obstructive pulmonary disease, unspecified: Secondary | ICD-10-CM | POA: Diagnosis not present

## 2020-05-26 DIAGNOSIS — M5412 Radiculopathy, cervical region: Secondary | ICD-10-CM | POA: Diagnosis not present

## 2020-05-26 DIAGNOSIS — M7061 Trochanteric bursitis, right hip: Secondary | ICD-10-CM | POA: Diagnosis not present

## 2020-05-26 DIAGNOSIS — Z79899 Other long term (current) drug therapy: Secondary | ICD-10-CM | POA: Diagnosis not present

## 2020-05-26 DIAGNOSIS — R69 Illness, unspecified: Secondary | ICD-10-CM | POA: Diagnosis not present

## 2020-05-26 DIAGNOSIS — M961 Postlaminectomy syndrome, not elsewhere classified: Secondary | ICD-10-CM | POA: Diagnosis not present

## 2020-05-26 DIAGNOSIS — M461 Sacroiliitis, not elsewhere classified: Secondary | ICD-10-CM | POA: Diagnosis not present

## 2020-06-05 DIAGNOSIS — Z299 Encounter for prophylactic measures, unspecified: Secondary | ICD-10-CM | POA: Diagnosis not present

## 2020-06-05 DIAGNOSIS — I10 Essential (primary) hypertension: Secondary | ICD-10-CM | POA: Diagnosis not present

## 2020-06-05 DIAGNOSIS — Z23 Encounter for immunization: Secondary | ICD-10-CM | POA: Diagnosis not present

## 2020-06-05 DIAGNOSIS — I509 Heart failure, unspecified: Secondary | ICD-10-CM | POA: Diagnosis not present

## 2020-06-05 DIAGNOSIS — K219 Gastro-esophageal reflux disease without esophagitis: Secondary | ICD-10-CM | POA: Diagnosis not present

## 2020-06-05 DIAGNOSIS — I25119 Atherosclerotic heart disease of native coronary artery with unspecified angina pectoris: Secondary | ICD-10-CM | POA: Diagnosis not present

## 2020-07-07 DIAGNOSIS — I509 Heart failure, unspecified: Secondary | ICD-10-CM | POA: Diagnosis not present

## 2020-07-07 DIAGNOSIS — I700 Atherosclerosis of aorta: Secondary | ICD-10-CM | POA: Diagnosis not present

## 2020-07-07 DIAGNOSIS — I10 Essential (primary) hypertension: Secondary | ICD-10-CM | POA: Diagnosis not present

## 2020-07-07 DIAGNOSIS — Z87891 Personal history of nicotine dependence: Secondary | ICD-10-CM | POA: Diagnosis not present

## 2020-07-07 DIAGNOSIS — Z299 Encounter for prophylactic measures, unspecified: Secondary | ICD-10-CM | POA: Diagnosis not present

## 2020-07-07 DIAGNOSIS — K219 Gastro-esophageal reflux disease without esophagitis: Secondary | ICD-10-CM | POA: Diagnosis not present

## 2020-07-23 DIAGNOSIS — H4311 Vitreous hemorrhage, right eye: Secondary | ICD-10-CM | POA: Diagnosis not present

## 2020-07-31 DIAGNOSIS — H35362 Drusen (degenerative) of macula, left eye: Secondary | ICD-10-CM | POA: Diagnosis not present

## 2020-07-31 DIAGNOSIS — H348311 Tributary (branch) retinal vein occlusion, right eye, with retinal neovascularization: Secondary | ICD-10-CM | POA: Diagnosis not present

## 2020-08-03 DIAGNOSIS — H4311 Vitreous hemorrhage, right eye: Secondary | ICD-10-CM | POA: Diagnosis not present

## 2020-08-03 DIAGNOSIS — H348311 Tributary (branch) retinal vein occlusion, right eye, with retinal neovascularization: Secondary | ICD-10-CM | POA: Diagnosis not present

## 2020-08-04 DIAGNOSIS — H348311 Tributary (branch) retinal vein occlusion, right eye, with retinal neovascularization: Secondary | ICD-10-CM | POA: Diagnosis not present

## 2020-08-11 DIAGNOSIS — H4311 Vitreous hemorrhage, right eye: Secondary | ICD-10-CM | POA: Diagnosis not present

## 2020-08-11 DIAGNOSIS — H348311 Tributary (branch) retinal vein occlusion, right eye, with retinal neovascularization: Secondary | ICD-10-CM | POA: Diagnosis not present

## 2020-08-13 ENCOUNTER — Ambulatory Visit: Payer: Medicare HMO | Admitting: Family Medicine

## 2020-08-19 ENCOUNTER — Ambulatory Visit: Payer: Medicare HMO | Admitting: Cardiology

## 2020-08-24 DIAGNOSIS — M5412 Radiculopathy, cervical region: Secondary | ICD-10-CM | POA: Diagnosis not present

## 2020-08-24 DIAGNOSIS — R69 Illness, unspecified: Secondary | ICD-10-CM | POA: Diagnosis not present

## 2020-08-24 DIAGNOSIS — J449 Chronic obstructive pulmonary disease, unspecified: Secondary | ICD-10-CM | POA: Diagnosis not present

## 2020-08-24 DIAGNOSIS — M461 Sacroiliitis, not elsewhere classified: Secondary | ICD-10-CM | POA: Diagnosis not present

## 2020-08-24 DIAGNOSIS — Z79899 Other long term (current) drug therapy: Secondary | ICD-10-CM | POA: Diagnosis not present

## 2020-08-24 DIAGNOSIS — M7061 Trochanteric bursitis, right hip: Secondary | ICD-10-CM | POA: Diagnosis not present

## 2020-08-24 DIAGNOSIS — M961 Postlaminectomy syndrome, not elsewhere classified: Secondary | ICD-10-CM | POA: Diagnosis not present

## 2020-08-28 ENCOUNTER — Encounter: Payer: Self-pay | Admitting: *Deleted

## 2020-08-28 ENCOUNTER — Ambulatory Visit (INDEPENDENT_AMBULATORY_CARE_PROVIDER_SITE_OTHER): Payer: Medicare HMO | Admitting: Cardiology

## 2020-08-28 ENCOUNTER — Encounter: Payer: Self-pay | Admitting: Cardiology

## 2020-08-28 ENCOUNTER — Other Ambulatory Visit: Payer: Self-pay

## 2020-08-28 VITALS — BP 98/60 | HR 53 | Ht 69.0 in | Wt 182.0 lb

## 2020-08-28 DIAGNOSIS — I2510 Atherosclerotic heart disease of native coronary artery without angina pectoris: Secondary | ICD-10-CM | POA: Diagnosis not present

## 2020-08-28 DIAGNOSIS — E782 Mixed hyperlipidemia: Secondary | ICD-10-CM

## 2020-08-28 DIAGNOSIS — I10 Essential (primary) hypertension: Secondary | ICD-10-CM

## 2020-08-28 NOTE — Progress Notes (Signed)
Clinical Summary Roy [name] is a 65 y.o.male former patient of Dr Bronson Ing, seen today for the following medical problems.    1. CAD - CABG Jan 2020 LIMA-LAD, SVG-PDA, and Seq-SVG-OM-RI.  Jan 2021 nuclear stress: Small, mild to moderate intensity, partially reversible apical to basal inferior defect. Partial reversibility most evident in mid to basal segment consistent with ischemic territory.  - imdur 30 caused orthostatic symptoms in the past, tolerating 15mg  daily.   - no recent chest pains. No SOB or DOE - compliant with meds. No lightheadedness or dizzines  2. HTN - compliant with meds  3. Hyperlipidemia - labs followed by pcp - compliant with statin   SH: works in Teacher, music    Past Medical History:  Diagnosis Date  . Abdominal pain, right upper quadrant   . Arthritis    "all my joints" (11/28/2013)  . Blind left eye   . CAD (coronary artery disease)    a. Prior BMS to OM and RCA, subsequent angioplasty due to ISR in these distributions, also BMS placement with DES to LAD. b. Cath 01/2013: diffuse 3V CAD without clear culprit lesion, normal LVEF, mid LAD lesion noted, treat medically. c. Cath 11/28/2013 DES to mid LAD lesion with abnormal FFR, DES to distal LAD lesion, DES to prox RCA d. 07/24/2018:CABG x4 with LIMA-LAD, SVG-PDA, and Seq-SVG-OM-RI.  [name] Chronic lower back pain   . COPD (chronic obstructive pulmonary disease) ([place])   . DDD (degenerative disc disease)   . Depression   . GERD (gastroesophageal reflux disease)   . Hepatitis C C   "had the tx and I'm free of it"  . History of blood transfusion    "when I was taking tx for hepatitis"  . History of kidney stones   . Hyperlipidemia   . Hypertension   . Hypothyroidism   . Kidney stones    "no surgeries"  . Macular degeneration of both eyes    "already blind in my left eye"  . Myocardial infarction ([place]) ~ 2007   . Other and unspecified angina pectoris   . Scoliosis   . Sinus bradycardia       Allergies  Allergen Reactions  . Penicillins Other (See Comments)    Chest pain PATIENT HAS HAD A PCN REACTION WITH IMMEDIATE RASH, FACIAL/TONGUE/THROAT SWELLING, SOB, OR LIGHTHEADEDNESS WITH HYPOTENSION:  #  #  YES  #  #  Has patient had a PCN reaction causing severe rash involving mucus membranes or skin necrosis: no Has patient had a PCN reaction that required hospitalization: no Has patient had a PCN reaction occurring within the last 10 years: no If all of the above answers are "NO", then may proceed with Cephalosporin use.   . Imdur [Isosorbide Nitrate] Other (See Comments)    Has not tolerated well in the past UNSPECIFIED REACTION   . Codeine Itching  . Food Rash and Other (See Comments)    PEACHES     Current Outpatient Medications  Medication Sig Dispense Refill  . amLODipine (NORVASC) 10 MG tablet Take 10 mg by mouth daily.    [name] aspirin 325 MG tablet Take 325 mg by mouth daily.    . carvedilol (COREG) 6.25 MG tablet Take 6.25 mg by mouth 2 (two) times daily with a meal.    . cyclobenzaprine (FLEXERIL) 10 MG tablet Take 10 mg by mouth at bedtime.     [name] escitalopram (LEXAPRO) 20 MG tablet Take 20 mg by mouth daily.     [name]  furosemide (LASIX) 20 MG tablet Take 1 tablet (20 mg total) by mouth daily as needed. For weight gain of 3-5 lbs (Patient taking differently: Take 20 mg by mouth daily. For weight gain of 3-5 lbs) 30 tablet   . gabapentin (NEURONTIN) 300 MG capsule Take 600 mg by mouth 3 (three) times daily.     . isosorbide mononitrate (IMDUR) 30 MG 24 hr tablet Take 0.5 tablets (15 mg total) by mouth daily. 15 tablet 6  . levothyroxine (SYNTHROID, LEVOTHROID) 75 MCG tablet Take 75 mcg by mouth daily before breakfast.    . methotrexate 2.5 MG tablet Take 20 mg by mouth once a week. Caution:Chemotherapy. Protect from light.    . nitroGLYCERIN (NITROSTAT) 0.4 MG SL tablet Place 1 tablet (0.4 mg total) under the tongue every 5 (five) minutes x 3 doses as needed. (Patient  taking differently: Place 0.4 mg under the tongue every 5 (five) minutes x 3 doses as needed for chest pain. ) 25 tablet 3  . oxyCODONE-acetaminophen (PERCOCET) 10-325 MG tablet Take 1 tablet by mouth every 6 (six) hours as needed for pain.    . potassium chloride (K-DUR) 10 MEQ tablet Take 1 tablet (10 mEq total) by mouth daily as needed. On days you take Lasix (Patient taking differently: Take 10 mEq by mouth daily. On days you take Lasix)    . rosuvastatin (CRESTOR) 20 MG tablet TAKE (1) TABLET BY MOUTH ONCE DAILY. 28 tablet 11  . tamsulosin (FLOMAX) 0.4 MG CAPS capsule Take 0.4 mg by mouth daily.    . traZODone (DESYREL) 100 MG tablet Take 100 mg by mouth at bedtime.     No current facility-administered medications for this visit.     Past Surgical History:  Procedure Laterality Date  . BACK SURGERY     x3 for scoliosis  . CARDIAC CATHETERIZATION     "I've had a few without intervention"  . COLONOSCOPY    . COLONOSCOPY N/A 05/02/2013   Procedure: COLONOSCOPY;  Surgeon: Rogene Houston, MD;  [facility];  Service: Endoscopy;  Laterality: N/A;  1030-moved to 1055 Ann to notify pt  . COLONOSCOPY N/A 07/06/2017   Procedure: COLONOSCOPY;  Surgeon: Rogene Houston, MD;  [facility];  Service: Endoscopy;  Laterality: N/A;  12:45  . CORONARY ANGIOPLASTY WITH STENT PLACEMENT   2007; 2008; 11/28/2013   "2 + 2; + 3"  . CORONARY ARTERY BYPASS GRAFT N/A 07/24/2018   Procedure: CORONARY ARTERY BYPASS GRAFTING (CABG);  Surgeon: Gaye Pollack, MD;  Location: [facility];  Service: Open Heart Surgery;  Laterality: N/A;  Times 4 using left internal mammary artery and endoscopically harvested right saphenous vein  . INGUINAL HERNIA REPAIR Bilateral ~ 2000  . LEFT HEART CATH AND CORONARY ANGIOGRAPHY N/A 07/12/2018   Procedure: LEFT HEART CATH AND CORONARY ANGIOGRAPHY;  Surgeon: Jettie Booze, MD;  Location: [facility];  Service: Cardiovascular;  Laterality: N/A;  . LEFT  HEART CATHETERIZATION WITH CORONARY ANGIOGRAM N/A 01/30/2013   Procedure: LEFT HEART CATHETERIZATION WITH CORONARY ANGIOGRAM;  Surgeon: Jolaine Artist, MD;  Location: [facility];  Service: Cardiovascular;  Laterality: N/A;  . LEFT HEART CATHETERIZATION WITH CORONARY ANGIOGRAM N/A 11/28/2013   Procedure: LEFT HEART CATHETERIZATION WITH CORONARY ANGIOGRAM;  Surgeon: Jettie Booze, MD;  Location: [facility];  Service: Cardiovascular;  Laterality: N/A;  . LIVER BIOPSY  03/24/2010  . POLYPECTOMY  07/06/2017   Procedure: POLYPECTOMY;  Surgeon: Rogene Houston, MD;  [facility];  Service: Endoscopy;;  colon  . TEE WITHOUT CARDIOVERSION N/A 07/24/2018   Procedure: TRANSESOPHAGEAL ECHOCARDIOGRAM (TEE);  Surgeon: Gaye Pollack, MD;  Location: [facility];  Service: Open Heart Surgery;  Laterality: N/A;  . TOOTH EXTRACTION       Allergies  Allergen Reactions  . Penicillins Other (See Comments)    Chest pain PATIENT HAS HAD A PCN REACTION WITH IMMEDIATE RASH, FACIAL/TONGUE/THROAT SWELLING, SOB, OR LIGHTHEADEDNESS WITH HYPOTENSION:  #  #  YES  #  #  Has patient had a PCN reaction causing severe rash involving mucus membranes or skin necrosis: no Has patient had a PCN reaction that required hospitalization: no Has patient had a PCN reaction occurring within the last 10 years: no If all of the above answers are "NO", then may proceed with Cephalosporin use.   . Imdur [Isosorbide Nitrate] Other (See Comments)    Has not tolerated well in the past UNSPECIFIED REACTION   . Codeine Itching  . Food Rash and Other (See Comments)    PEACHES      Family History  Problem Relation Age of Onset  . Diabetes Mother   . Diabetes Brother   . Heart disease Brother   . Diabetes Brother   . Healthy Brother   . Healthy Son   . Healthy Son      Social History Mr. Slyter reports that he quit smoking about 14 years ago. His smoking use included cigarettes. He started smoking about 49 years  ago. He has a 52.50 pack-year smoking history. He has never used smokeless tobacco. Mr. Antonellis reports no history of alcohol use.   Review of Systems CONSTITUTIONAL: No weight loss, fever, chills, weakness or fatigue.  HEENT: Eyes: No visual loss, blurred vision, double vision or yellow sclerae.No hearing loss, sneezing, congestion, runny nose or sore throat.  SKIN: No rash or itching.  CARDIOVASCULAR: per hpi RESPIRATORY: No shortness of breath, cough or sputum.  GASTROINTESTINAL: No anorexia, nausea, vomiting or diarrhea. No abdominal pain or blood.  GENITOURINARY: No burning on urination, no polyuria NEUROLOGICAL: No headache, dizziness, syncope, paralysis, ataxia, numbness or tingling in the extremities. No change in bowel or bladder control.  MUSCULOSKELETAL: No muscle, back pain, joint pain or stiffness.  LYMPHATICS: No enlarged nodes. No history of splenectomy.  PSYCHIATRIC: No history of depression or anxiety.  ENDOCRINOLOGIC: No reports of sweating, cold or heat intolerance. No polyuria or polydipsia.  [name]   Physical Examination Today's Vitals   08/28/20 1126  BP: 98/60  Pulse: (!) 53  SpO2: 97%  Weight: 182 lb (82.6 kg)  Height: 5\' 9"  (1.753 m)   Body mass index is 26.88 kg/m.  Gen: resting comfortably, no acute distress HEENT: no scleral icterus, pupils equal round and reactive, no palptable cervical adenopathy,  CV: RRR, no m/rg, no jvd Resp: Clear to auscultation bilaterally GI: abdomen is soft, non-tender, non-distended, normal bowel sounds, no hepatosplenomegaly MSK: extremities are warm, no edema.  Skin: warm, no rash Neuro:  no focal deficits Psych: appropriate affect   Diagnostic Studies Cardiac Catheterization: 08/02/2018  Previously placed Prox RCA to Mid RCA stent (unknown type) is widely patent.  Mid LM to Dist LM lesion is 40% stenosed.  Mid RCA lesion is 10% stenosed.  Ost Ramus to Ramus lesion is 95% stenosed.  Ost 1st Mrg to 1st Mrg lesion  is 95% stenosed.  Ost LAD lesion is 50% stenosed.  Previously placed Prox LAD stent (unknown type) is widely patent.  Ost 1st Diag to 1st Diag lesion is 99% stenosed.  Ost 2nd Diag to 2nd Diag lesion is 70% stenosed.  Mid LAD-1 lesion is 75% stenosed.  Previously placed Mid LAD-2 stent (unknown type) is widely patent.  Dist RCA lesion is 25% stenosed.  Dist Cx lesion is 90% stenosed.  The left ventricular systolic function is normal.  LV end diastolic pressure is normal.  The left ventricular ejection fraction is 55-65% by visual estimate.  There is no aortic valve stenosis.  Severe multivessel CAD. Multiple branches involved and moderate distal left main and ostial LAD disease as well. Severe mid LAD disease. CTO of large diagonal Roy [name].   When compared to the 2015 cath, there has been significant progression of the mid LAD disease, Roy [name] vessel disease and mild progression of the distal left main disease.  Plan for Cardiac surgery consult for CABG.  TEE: 07/24/2018  Aortic valve: The valve is trileaflet. No stenosis. No regurgitation.  Right ventricle: Normal cavity size, wall thickness and ejection fraction.  Mitral valve: Trace regurgitation     Assessment and Plan  1. CAD - no recent symptoms, continue current meds  2. HTN - at goal, continue current meds. Mildly soft bp's asymptomatic, continue to monitor  3. Hyperlipidemia - request pcp labs, continue statin      Arnoldo Lenis, M.D.

## 2020-08-28 NOTE — Patient Instructions (Signed)
Your physician wants you to follow-up in: 6 MONTHS WITH DR BRANCH   Your physician recommends that you continue on your current medications as directed. Please refer to the Current Medication list given to you today.  Thank you for choosing [facility]!!   

## 2020-09-01 DIAGNOSIS — H348312 Tributary (branch) retinal vein occlusion, right eye, stable: Secondary | ICD-10-CM | POA: Diagnosis not present

## 2020-09-01 DIAGNOSIS — H4311 Vitreous hemorrhage, right eye: Secondary | ICD-10-CM | POA: Diagnosis not present

## 2020-09-02 DIAGNOSIS — M461 Sacroiliitis, not elsewhere classified: Secondary | ICD-10-CM | POA: Diagnosis not present

## 2020-09-18 DIAGNOSIS — M549 Dorsalgia, unspecified: Secondary | ICD-10-CM | POA: Diagnosis not present

## 2020-09-18 DIAGNOSIS — I10 Essential (primary) hypertension: Secondary | ICD-10-CM | POA: Diagnosis not present

## 2020-09-18 DIAGNOSIS — Z299 Encounter for prophylactic measures, unspecified: Secondary | ICD-10-CM | POA: Diagnosis not present

## 2020-09-18 DIAGNOSIS — K219 Gastro-esophageal reflux disease without esophagitis: Secondary | ICD-10-CM | POA: Diagnosis not present

## 2020-09-18 DIAGNOSIS — I209 Angina pectoris, unspecified: Secondary | ICD-10-CM | POA: Diagnosis not present

## 2020-09-18 DIAGNOSIS — Z6828 Body mass index (BMI) 28.0-28.9, adult: Secondary | ICD-10-CM | POA: Diagnosis not present

## 2020-09-24 ENCOUNTER — Other Ambulatory Visit: Payer: Self-pay | Admitting: *Deleted

## 2020-09-24 MED ORDER — ISOSORBIDE MONONITRATE ER 30 MG PO TB24: 15.0000 mg | ORAL_TABLET | Freq: Every day | ORAL | 6 refills | Status: DC

## 2020-10-28 DIAGNOSIS — I10 Essential (primary) hypertension: Secondary | ICD-10-CM | POA: Diagnosis not present

## 2020-10-28 DIAGNOSIS — Z6828 Body mass index (BMI) 28.0-28.9, adult: Secondary | ICD-10-CM | POA: Diagnosis not present

## 2020-10-28 DIAGNOSIS — I25119 Atherosclerotic heart disease of native coronary artery with unspecified angina pectoris: Secondary | ICD-10-CM | POA: Diagnosis not present

## 2020-10-28 DIAGNOSIS — Z299 Encounter for prophylactic measures, unspecified: Secondary | ICD-10-CM | POA: Diagnosis not present

## 2020-10-28 DIAGNOSIS — R030 Elevated blood-pressure reading, without diagnosis of hypertension: Secondary | ICD-10-CM | POA: Diagnosis not present

## 2020-10-28 DIAGNOSIS — I509 Heart failure, unspecified: Secondary | ICD-10-CM | POA: Diagnosis not present

## 2020-10-28 DIAGNOSIS — J449 Chronic obstructive pulmonary disease, unspecified: Secondary | ICD-10-CM | POA: Diagnosis not present

## 2020-11-17 DIAGNOSIS — M461 Sacroiliitis, not elsewhere classified: Secondary | ICD-10-CM | POA: Diagnosis not present

## 2020-11-17 DIAGNOSIS — M5412 Radiculopathy, cervical region: Secondary | ICD-10-CM | POA: Diagnosis not present

## 2020-11-17 DIAGNOSIS — J449 Chronic obstructive pulmonary disease, unspecified: Secondary | ICD-10-CM | POA: Diagnosis not present

## 2020-11-17 DIAGNOSIS — M7061 Trochanteric bursitis, right hip: Secondary | ICD-10-CM | POA: Diagnosis not present

## 2020-11-17 DIAGNOSIS — Z79899 Other long term (current) drug therapy: Secondary | ICD-10-CM | POA: Diagnosis not present

## 2020-11-17 DIAGNOSIS — R69 Illness, unspecified: Secondary | ICD-10-CM | POA: Diagnosis not present

## 2020-11-17 DIAGNOSIS — M961 Postlaminectomy syndrome, not elsewhere classified: Secondary | ICD-10-CM | POA: Diagnosis not present

## 2020-12-11 DIAGNOSIS — M5416 Radiculopathy, lumbar region: Secondary | ICD-10-CM | POA: Diagnosis not present

## 2020-12-17 DIAGNOSIS — T1502XA Foreign body in cornea, left eye, initial encounter: Secondary | ICD-10-CM | POA: Diagnosis not present

## 2020-12-18 DIAGNOSIS — I509 Heart failure, unspecified: Secondary | ICD-10-CM | POA: Diagnosis not present

## 2020-12-18 DIAGNOSIS — M069 Rheumatoid arthritis, unspecified: Secondary | ICD-10-CM | POA: Diagnosis not present

## 2020-12-18 DIAGNOSIS — Z299 Encounter for prophylactic measures, unspecified: Secondary | ICD-10-CM | POA: Diagnosis not present

## 2020-12-18 DIAGNOSIS — I10 Essential (primary) hypertension: Secondary | ICD-10-CM | POA: Diagnosis not present

## 2020-12-21 DIAGNOSIS — M069 Rheumatoid arthritis, unspecified: Secondary | ICD-10-CM | POA: Diagnosis not present

## 2021-01-11 ENCOUNTER — Telehealth: Payer: Self-pay | Admitting: Cardiology

## 2021-01-11 NOTE — Telephone Encounter (Signed)
   [facility] Pre-operative Risk Assessment    Patient Name: Roy [name]  DOB: [birth year] MRN: [id number]  HEARTCARE STAFF:  - IMPORTANT!!!!!! Under Visit Info/Reason for Call, type in Other and utilize the format Clearance MM/DD/YY or Clearance TBD. Do not use dashes or single digits. - Please review there is not already an duplicate clearance open for this procedure. - If request is for dental extraction, please clarify the # of teeth to be extracted. - If the patient is currently at the dentist's office, call Pre-Op Callback Staff (MA/nurse) to input urgent request.  - If the patient is not currently in the dentist office, please route to the Pre-Op pool.  Request for surgical clearance:  What type of surgery is being performed cervical epidural steroid injection   When is this surgery scheduled? TBD  What type of clearance is required (medical clearance vs. Pharmacy clearance to hold med vs. Both)?  pharmacy  Are there any medications that need to be held prior to surgery and how long? Aspirin 7 days prior   Practice name and name of physician performing surgery? [facility]   What is the office phone number? [phone]   7.   What is the office fax number? [phone]  8.   Anesthesia type (None, local, MAC, general) ? Not specified   Jannet Askew 01/11/2021, 4:17 PM  _________________________________________________________________   (provider comments below)

## 2021-01-11 NOTE — Telephone Encounter (Signed)
Dr. Harl Bowie Pt had CABG in Jan 2020 with partial reversibility on follow up stress test. Can he hold ASA 7 days prior to epidural steroid injection?

## 2021-01-13 NOTE — Telephone Encounter (Signed)
Ok to hold aspirin   Zandra Abts MD

## 2021-01-13 NOTE — Telephone Encounter (Signed)
   Name: Roy [name]  DOB: 1956-05-30  MRN: [id number]   Primary Cardiologist: Carlyle Dolly, MD  Chart reviewed as part of pre-operative protocol coverage.   Per Dr. Harl Bowie; OK to hold ASA prior to injection.  I will route this recommendation to the requesting party via Epic fax function and remove from pre-op pool. Please call with questions.  Tami Lin [place], PA 01/13/2021, 5:00 PM

## 2021-01-20 DIAGNOSIS — M5412 Radiculopathy, cervical region: Secondary | ICD-10-CM | POA: Diagnosis not present

## 2021-01-20 DIAGNOSIS — Z6826 Body mass index (BMI) 26.0-26.9, adult: Secondary | ICD-10-CM | POA: Diagnosis not present

## 2021-02-04 ENCOUNTER — Encounter: Payer: Self-pay | Admitting: Cardiology

## 2021-02-19 DIAGNOSIS — Z299 Encounter for prophylactic measures, unspecified: Secondary | ICD-10-CM | POA: Diagnosis not present

## 2021-02-19 DIAGNOSIS — I10 Essential (primary) hypertension: Secondary | ICD-10-CM | POA: Diagnosis not present

## 2021-02-19 DIAGNOSIS — S01319A Laceration without foreign body of unspecified ear, initial encounter: Secondary | ICD-10-CM | POA: Diagnosis not present

## 2021-03-02 DIAGNOSIS — M5416 Radiculopathy, lumbar region: Secondary | ICD-10-CM | POA: Diagnosis not present

## 2021-03-02 DIAGNOSIS — R69 Illness, unspecified: Secondary | ICD-10-CM | POA: Diagnosis not present

## 2021-03-02 DIAGNOSIS — J449 Chronic obstructive pulmonary disease, unspecified: Secondary | ICD-10-CM | POA: Diagnosis not present

## 2021-03-02 DIAGNOSIS — Z79899 Other long term (current) drug therapy: Secondary | ICD-10-CM | POA: Diagnosis not present

## 2021-03-02 DIAGNOSIS — M461 Sacroiliitis, not elsewhere classified: Secondary | ICD-10-CM | POA: Diagnosis not present

## 2021-03-02 DIAGNOSIS — M7061 Trochanteric bursitis, right hip: Secondary | ICD-10-CM | POA: Diagnosis not present

## 2021-03-02 DIAGNOSIS — M5412 Radiculopathy, cervical region: Secondary | ICD-10-CM | POA: Diagnosis not present

## 2021-03-02 DIAGNOSIS — M961 Postlaminectomy syndrome, not elsewhere classified: Secondary | ICD-10-CM | POA: Diagnosis not present

## 2021-03-11 DIAGNOSIS — M1711 Unilateral primary osteoarthritis, right knee: Secondary | ICD-10-CM | POA: Diagnosis not present

## 2021-03-11 DIAGNOSIS — E7800 Pure hypercholesterolemia, unspecified: Secondary | ICD-10-CM | POA: Diagnosis not present

## 2021-03-11 DIAGNOSIS — Z136 Encounter for screening for cardiovascular disorders: Secondary | ICD-10-CM | POA: Diagnosis not present

## 2021-03-11 DIAGNOSIS — Z6828 Body mass index (BMI) 28.0-28.9, adult: Secondary | ICD-10-CM | POA: Diagnosis not present

## 2021-03-11 DIAGNOSIS — Z299 Encounter for prophylactic measures, unspecified: Secondary | ICD-10-CM | POA: Diagnosis not present

## 2021-03-11 DIAGNOSIS — Z7189 Other specified counseling: Secondary | ICD-10-CM | POA: Diagnosis not present

## 2021-03-11 DIAGNOSIS — Z1331 Encounter for screening for depression: Secondary | ICD-10-CM | POA: Diagnosis not present

## 2021-03-11 DIAGNOSIS — Z1339 Encounter for screening examination for other mental health and behavioral disorders: Secondary | ICD-10-CM | POA: Diagnosis not present

## 2021-03-11 DIAGNOSIS — Z0000 Encounter for general adult medical examination without abnormal findings: Secondary | ICD-10-CM | POA: Diagnosis not present

## 2021-03-11 DIAGNOSIS — I10 Essential (primary) hypertension: Secondary | ICD-10-CM | POA: Diagnosis not present

## 2021-03-11 DIAGNOSIS — M25461 Effusion, right knee: Secondary | ICD-10-CM | POA: Diagnosis not present

## 2021-03-11 DIAGNOSIS — M25561 Pain in right knee: Secondary | ICD-10-CM | POA: Diagnosis not present

## 2021-03-12 DIAGNOSIS — Z299 Encounter for prophylactic measures, unspecified: Secondary | ICD-10-CM | POA: Diagnosis not present

## 2021-03-12 DIAGNOSIS — Z6828 Body mass index (BMI) 28.0-28.9, adult: Secondary | ICD-10-CM | POA: Diagnosis not present

## 2021-03-12 DIAGNOSIS — M069 Rheumatoid arthritis, unspecified: Secondary | ICD-10-CM | POA: Diagnosis not present

## 2021-03-12 DIAGNOSIS — Z713 Dietary counseling and surveillance: Secondary | ICD-10-CM | POA: Diagnosis not present

## 2021-03-12 DIAGNOSIS — I10 Essential (primary) hypertension: Secondary | ICD-10-CM | POA: Diagnosis not present

## 2021-03-15 DIAGNOSIS — E039 Hypothyroidism, unspecified: Secondary | ICD-10-CM | POA: Diagnosis not present

## 2021-03-15 DIAGNOSIS — Z0000 Encounter for general adult medical examination without abnormal findings: Secondary | ICD-10-CM | POA: Diagnosis not present

## 2021-03-15 DIAGNOSIS — E7800 Pure hypercholesterolemia, unspecified: Secondary | ICD-10-CM | POA: Diagnosis not present

## 2021-03-15 DIAGNOSIS — Z125 Encounter for screening for malignant neoplasm of prostate: Secondary | ICD-10-CM | POA: Diagnosis not present

## 2021-03-15 DIAGNOSIS — Z79899 Other long term (current) drug therapy: Secondary | ICD-10-CM | POA: Diagnosis not present

## 2021-03-16 ENCOUNTER — Encounter: Payer: Self-pay | Admitting: *Deleted

## 2021-03-16 DIAGNOSIS — Z23 Encounter for immunization: Secondary | ICD-10-CM | POA: Diagnosis not present

## 2021-03-17 ENCOUNTER — Ambulatory Visit: Payer: Medicare HMO | Admitting: Cardiology

## 2021-03-17 NOTE — Progress Notes (Deleted)
Clinical Summary [name] is a 65 y.o.male  1. CAD - CABG Jan 2020 LIMA-LAD, SVG-PDA, and Seq-SVG-OM-RI. Jan 2021 nuclear stress: Small, mild to moderate intensity, partially reversible apical to basal inferior defect. Partial reversibility most evident in mid to basal segment consistent with ischemic territory.   - imdur 30 caused orthostatic symptoms in the past, tolerating '15mg'$  daily.    - no recent chest pains. No SOB or DOE - compliant with meds. No lightheadedness or dizzines   2. HTN - compliant with meds   3. Hyperlipidemia - labs followed by pcp - compliant with statin     SH: works in Teacher, music Past Medical History:  Diagnosis Date   Abdominal pain, right upper quadrant    Arthritis    "all my joints" (11/28/2013)   Blind left eye    CAD (coronary artery disease)    a. Prior BMS to OM and RCA, subsequent angioplasty due to ISR in these distributions, also BMS placement with DES to LAD. b. Cath 01/2013: diffuse 3V CAD without clear culprit lesion, normal LVEF, mid LAD lesion noted, treat medically. c. Cath 11/28/2013 DES to mid LAD lesion with abnormal FFR, DES to distal LAD lesion, DES to prox RCA d. 07/24/2018:CABG x4 with LIMA-LAD, SVG-PDA, and Seq-SVG-OM-RI.   Chronic lower back pain    COPD (chronic obstructive pulmonary disease) (HCC)    DDD (degenerative disc disease)    Depression    GERD (gastroesophageal reflux disease)    Hepatitis C C   "had the tx and I'm free of it"   History of blood transfusion    "when I was taking tx for hepatitis"   History of kidney stones    Hyperlipidemia    Hypertension    Hypothyroidism    Kidney stones    "no surgeries"   Macular degeneration of both eyes    "already blind in my left eye"   Myocardial infarction [facility]) ~ 2007    Other and unspecified angina pectoris    Scoliosis    Sinus bradycardia      Allergies  Allergen Reactions   Penicillins Other (See Comments)    Chest pain PATIENT HAS HAD A PCN  REACTION WITH IMMEDIATE RASH, FACIAL/TONGUE/THROAT SWELLING, SOB, OR LIGHTHEADEDNESS WITH HYPOTENSION:  #  #  YES  #  #  Has patient had a PCN reaction causing severe rash involving mucus membranes or skin necrosis: no Has patient had a PCN reaction that required hospitalization: no Has patient had a PCN reaction occurring within the last 10 years: no If all of the above answers are "NO", then may proceed with Cephalosporin use.    Imdur [Isosorbide Nitrate] Other (See Comments)    Has not tolerated well in the past UNSPECIFIED REACTION    Codeine Itching   Food Rash and Other (See Comments)    PEACHES     Current Outpatient Medications  Medication Sig Dispense Refill   amLODipine (NORVASC) 10 MG tablet Take 10 mg by mouth daily.     aspirin 325 MG tablet Take 325 mg by mouth daily.     carvedilol (COREG) 6.25 MG tablet Take 6.25 mg by mouth 2 (two) times daily with a meal.     cyclobenzaprine (FLEXERIL) 10 MG tablet Take 10 mg by mouth at bedtime.     escitalopram (LEXAPRO) 20 MG tablet Take 20 mg by mouth daily.      furosemide (LASIX) 20 MG tablet Take 1 tablet (20 mg total)  by mouth daily as needed. For weight gain of 3-5 lbs (Patient taking differently: Take 20 mg by mouth daily. For weight gain of 3-5 lbs) 30 tablet    gabapentin (NEURONTIN) 300 MG capsule Take 600 mg by mouth 3 (three) times daily.      isosorbide mononitrate (IMDUR) 30 MG 24 hr tablet Take 0.5 tablets (15 mg total) by mouth daily. 15 tablet 6   levothyroxine (SYNTHROID, LEVOTHROID) 75 MCG tablet Take 75 mcg by mouth daily before breakfast.     methotrexate 2.5 MG tablet Take 20 mg by mouth once a week. Caution:Chemotherapy. Protect from light.     nitroGLYCERIN (NITROSTAT) 0.4 MG SL tablet Place 1 tablet (0.4 mg total) under the tongue every 5 (five) minutes x 3 doses as needed. (Patient taking differently: Place 0.4 mg under the tongue every 5 (five) minutes x 3 doses as needed for chest pain.) 25 tablet 3    oxyCODONE-acetaminophen (PERCOCET) 10-325 MG tablet Take 1 tablet by mouth every 6 (six) hours as needed for pain.     pantoprazole (PROTONIX) 40 MG tablet Take 40 mg by mouth daily.     potassium chloride (K-DUR) 10 MEQ tablet Take 1 tablet (10 mEq total) by mouth daily as needed. On days you take Lasix (Patient taking differently: Take 10 mEq by mouth daily. On days you take Lasix)     rosuvastatin (CRESTOR) 20 MG tablet TAKE (1) TABLET BY MOUTH ONCE DAILY. 28 tablet 11   tamsulosin (FLOMAX) 0.4 MG CAPS capsule Take 0.4 mg by mouth daily.     traZODone (DESYREL) 100 MG tablet Take 100 mg by mouth at bedtime.     No current facility-administered medications for this visit.     Past Surgical History:  Procedure Laterality Date   BACK SURGERY     x3 for scoliosis   CARDIAC CATHETERIZATION     "I've had a few without intervention"   COLONOSCOPY     COLONOSCOPY N/A 05/02/2013   Procedure: COLONOSCOPY;  Surgeon: Rogene Houston, MD;  [facility];  Service: Endoscopy;  Laterality: N/A;  1030-moved to 1055 Ann to notify pt   COLONOSCOPY N/A 07/06/2017   Procedure: COLONOSCOPY;  Surgeon: Rogene Houston, MD;  [facility];  Service: Endoscopy;  Laterality: N/A;  12:45   CORONARY ANGIOPLASTY WITH STENT PLACEMENT   2007; 2008; 11/28/2013   "2 + 2; + 3"   CORONARY ARTERY BYPASS GRAFT N/A 07/24/2018   Procedure: CORONARY ARTERY BYPASS GRAFTING (CABG);  Surgeon: Gaye Pollack, MD;  Location: [place];  Service: Open Heart Surgery;  Laterality: N/A;  Times 4 using left internal mammary artery and endoscopically harvested right saphenous vein   INGUINAL HERNIA REPAIR Bilateral ~ 2000   LEFT HEART CATH AND CORONARY ANGIOGRAPHY N/A 07/12/2018   Procedure: LEFT HEART CATH AND CORONARY ANGIOGRAPHY;  Surgeon: Jettie Booze, MD;  Location: [place] [facility];  Service: Cardiovascular;  Laterality: N/A;   LEFT HEART CATHETERIZATION WITH CORONARY ANGIOGRAM N/A 01/30/2013   Procedure:  LEFT HEART CATHETERIZATION WITH CORONARY ANGIOGRAM;  Surgeon: Jolaine Artist, MD;  Location: [facility] CATH LAB;  Service: Cardiovascular;  Laterality: N/A;   LEFT HEART CATHETERIZATION WITH CORONARY ANGIOGRAM N/A 11/28/2013   Procedure: LEFT HEART CATHETERIZATION WITH CORONARY ANGIOGRAM;  Surgeon: Jettie Booze, MD;  Location: [facility] CATH LAB;  Service: Cardiovascular;  Laterality: N/A;   LIVER BIOPSY  03/24/2010   POLYPECTOMY  07/06/2017   Procedure: POLYPECTOMY;  Surgeon: Laural Golden,  Mechele Dawley, MD;  [facility];  Service: Endoscopy;;  colon   TEE WITHOUT CARDIOVERSION N/A 07/24/2018   Procedure: TRANSESOPHAGEAL ECHOCARDIOGRAM (TEE);  Surgeon: Gaye Pollack, MD;  Location: [place];  Service: Open Heart Surgery;  Laterality: N/A;   TOOTH EXTRACTION       Allergies  Allergen Reactions   Penicillins Other (See Comments)    Chest pain PATIENT HAS HAD A PCN REACTION WITH IMMEDIATE RASH, FACIAL/TONGUE/THROAT SWELLING, SOB, OR LIGHTHEADEDNESS WITH HYPOTENSION:  #  #  YES  #  #  Has patient had a PCN reaction causing severe rash involving mucus membranes or skin necrosis: no Has patient had a PCN reaction that required hospitalization: no Has patient had a PCN reaction occurring within the last 10 years: no If all of the above answers are "NO", then may proceed with Cephalosporin use.    Imdur [Isosorbide Nitrate] Other (See Comments)    Has not tolerated well in the past UNSPECIFIED REACTION    Codeine Itching   Food Rash and Other (See Comments)    PEACHES      Family History  Problem Relation Age of Onset   Diabetes Mother    Diabetes Brother    Heart disease Brother    Diabetes Brother    Healthy Brother    Healthy Son    Healthy Son      Social History [name] reports that he quit smoking about 15 years ago. His smoking use included cigarettes. He started smoking about 49 years ago. He has a 52.50 pack-year smoking history. He has never used smokeless tobacco. Mr.  Roy [name] reports no history of alcohol use.   Review of Systems CONSTITUTIONAL: No weight loss, fever, chills, weakness or fatigue.  HEENT: Eyes: No visual loss, blurred vision, double vision or yellow sclerae.No hearing loss, sneezing, congestion, runny nose or sore throat.  SKIN: No rash or itching.  CARDIOVASCULAR:  RESPIRATORY: No shortness of breath, cough or sputum.  GASTROINTESTINAL: No anorexia, nausea, vomiting or diarrhea. No abdominal pain or blood.  GENITOURINARY: No burning on urination, no polyuria NEUROLOGICAL: No headache, dizziness, syncope, paralysis, ataxia, numbness or tingling in the extremities. No change in bowel or bladder control.  MUSCULOSKELETAL: No muscle, back pain, joint pain or stiffness.  LYMPHATICS: No enlarged nodes. No history of splenectomy.  PSYCHIATRIC: No history of depression or anxiety.  ENDOCRINOLOGIC: No reports of sweating, cold or heat intolerance. No polyuria or polydipsia.  [name]   Physical Examination There were no vitals filed for this visit. There were no vitals filed for this visit.  Gen: resting comfortably, no acute distress HEENT: no scleral icterus, pupils equal round and reactive, no palptable cervical adenopathy,  CV Resp: Clear to auscultation bilaterally GI: abdomen is soft, non-tender, non-distended, normal bowel sounds, no hepatosplenomegaly MSK: extremities are warm, no edema.  Skin: warm, no rash Neuro:  no focal deficits Psych: appropriate affect   Diagnostic Studies   Cardiac Catheterization: 07/12/2018 Previously placed Prox RCA to Mid RCA stent (unknown type) is widely patent. Mid LM to Dist LM lesion is 40% stenosed. Mid RCA lesion is 10% stenosed. Ost Ramus to Ramus lesion is 95% stenosed. Ost 1st Mrg to 1st Mrg lesion is 95% stenosed. Ost LAD lesion is 50% stenosed. Previously placed Prox LAD stent (unknown type) is widely patent. Ost 1st Diag to 1st Diag lesion is 99% stenosed. Ost 2nd Diag to 2nd Diag  lesion is 70% stenosed. Mid LAD-1 lesion is 75% stenosed. Previously  placed Mid LAD-2 stent (unknown type) is widely patent. Dist RCA lesion is 25% stenosed. Dist Cx lesion is 90% stenosed. The left ventricular systolic function is normal. LV end diastolic pressure is normal. The left ventricular ejection fraction is 55-65% by visual estimate. There is no aortic valve stenosis.   Severe multivessel CAD.  Multiple branches involved and moderate distal left main and ostial LAD disease as well.  Severe mid LAD disease.  CTO of large diagonal Juliano Mceachin.    When compared to the 2015 cath, there has been significant progression of the mid LAD disease, Jaylean Buenaventura vessel disease and mild progression of the distal left main disease.   Plan for Cardiac surgery consult for CABG.     TEE: 07/24/2018 Aortic valve: The valve is trileaflet. No stenosis. No regurgitation. Right ventricle: Normal cavity size, wall thickness and ejection fraction. Mitral valve: Trace regurgitation   Assessment and Plan  1. CAD - no recent symptoms, continue current meds   2. HTN - at goal, continue current meds. Mildly soft bp's asymptomatic, continue to monitor   3. Hyperlipidemia - request pcp labs, continue statin      Arnoldo Lenis, M.D., F.A.C.C.

## 2021-03-18 ENCOUNTER — Encounter: Payer: Self-pay | Admitting: Cardiology

## 2021-03-22 DIAGNOSIS — Z6828 Body mass index (BMI) 28.0-28.9, adult: Secondary | ICD-10-CM | POA: Diagnosis not present

## 2021-03-22 DIAGNOSIS — I10 Essential (primary) hypertension: Secondary | ICD-10-CM | POA: Diagnosis not present

## 2021-03-22 DIAGNOSIS — Z299 Encounter for prophylactic measures, unspecified: Secondary | ICD-10-CM | POA: Diagnosis not present

## 2021-03-22 DIAGNOSIS — E7800 Pure hypercholesterolemia, unspecified: Secondary | ICD-10-CM | POA: Diagnosis not present

## 2021-03-22 DIAGNOSIS — I509 Heart failure, unspecified: Secondary | ICD-10-CM | POA: Diagnosis not present

## 2021-03-22 DIAGNOSIS — Z23 Encounter for immunization: Secondary | ICD-10-CM | POA: Diagnosis not present

## 2021-03-23 DIAGNOSIS — M1711 Unilateral primary osteoarthritis, right knee: Secondary | ICD-10-CM | POA: Diagnosis not present

## 2021-04-23 DIAGNOSIS — Z6827 Body mass index (BMI) 27.0-27.9, adult: Secondary | ICD-10-CM | POA: Diagnosis not present

## 2021-04-23 DIAGNOSIS — I10 Essential (primary) hypertension: Secondary | ICD-10-CM | POA: Diagnosis not present

## 2021-04-23 DIAGNOSIS — Z713 Dietary counseling and surveillance: Secondary | ICD-10-CM | POA: Diagnosis not present

## 2021-04-23 DIAGNOSIS — M069 Rheumatoid arthritis, unspecified: Secondary | ICD-10-CM | POA: Diagnosis not present

## 2021-04-23 DIAGNOSIS — Z299 Encounter for prophylactic measures, unspecified: Secondary | ICD-10-CM | POA: Diagnosis not present

## 2021-05-12 ENCOUNTER — Other Ambulatory Visit: Payer: Self-pay | Admitting: Cardiology

## 2021-05-25 DIAGNOSIS — R69 Illness, unspecified: Secondary | ICD-10-CM | POA: Diagnosis not present

## 2021-05-25 DIAGNOSIS — Z79899 Other long term (current) drug therapy: Secondary | ICD-10-CM | POA: Diagnosis not present

## 2021-05-25 DIAGNOSIS — M961 Postlaminectomy syndrome, not elsewhere classified: Secondary | ICD-10-CM | POA: Diagnosis not present

## 2021-05-25 DIAGNOSIS — J449 Chronic obstructive pulmonary disease, unspecified: Secondary | ICD-10-CM | POA: Diagnosis not present

## 2021-05-25 DIAGNOSIS — M5416 Radiculopathy, lumbar region: Secondary | ICD-10-CM | POA: Diagnosis not present

## 2021-05-25 DIAGNOSIS — M461 Sacroiliitis, not elsewhere classified: Secondary | ICD-10-CM | POA: Diagnosis not present

## 2021-05-25 DIAGNOSIS — M7061 Trochanteric bursitis, right hip: Secondary | ICD-10-CM | POA: Diagnosis not present

## 2021-05-25 DIAGNOSIS — M5412 Radiculopathy, cervical region: Secondary | ICD-10-CM | POA: Diagnosis not present

## 2021-06-04 DIAGNOSIS — M5412 Radiculopathy, cervical region: Secondary | ICD-10-CM | POA: Diagnosis not present

## 2021-06-14 ENCOUNTER — Other Ambulatory Visit: Payer: Self-pay | Admitting: Cardiology

## 2021-07-06 ENCOUNTER — Other Ambulatory Visit: Payer: Self-pay | Admitting: Cardiology

## 2021-07-20 DIAGNOSIS — Z23 Encounter for immunization: Secondary | ICD-10-CM | POA: Diagnosis not present

## 2021-08-30 DIAGNOSIS — M461 Sacroiliitis, not elsewhere classified: Secondary | ICD-10-CM | POA: Diagnosis not present

## 2021-08-30 DIAGNOSIS — M5412 Radiculopathy, cervical region: Secondary | ICD-10-CM | POA: Diagnosis not present

## 2021-08-30 DIAGNOSIS — M961 Postlaminectomy syndrome, not elsewhere classified: Secondary | ICD-10-CM | POA: Diagnosis not present

## 2021-08-30 DIAGNOSIS — Z79899 Other long term (current) drug therapy: Secondary | ICD-10-CM | POA: Diagnosis not present

## 2021-08-30 DIAGNOSIS — J449 Chronic obstructive pulmonary disease, unspecified: Secondary | ICD-10-CM | POA: Diagnosis not present

## 2021-08-30 DIAGNOSIS — M5416 Radiculopathy, lumbar region: Secondary | ICD-10-CM | POA: Diagnosis not present

## 2021-09-22 ENCOUNTER — Other Ambulatory Visit: Payer: Self-pay

## 2021-09-22 ENCOUNTER — Encounter: Payer: Self-pay | Admitting: Cardiology

## 2021-09-22 ENCOUNTER — Ambulatory Visit (INDEPENDENT_AMBULATORY_CARE_PROVIDER_SITE_OTHER): Payer: Medicare Other | Admitting: Cardiology

## 2021-09-22 ENCOUNTER — Encounter: Payer: Self-pay | Admitting: *Deleted

## 2021-09-22 VITALS — BP 120/64 | HR 64 | Ht 69.0 in | Wt 187.8 lb

## 2021-09-22 DIAGNOSIS — I2510 Atherosclerotic heart disease of native coronary artery without angina pectoris: Secondary | ICD-10-CM

## 2021-09-22 DIAGNOSIS — E782 Mixed hyperlipidemia: Secondary | ICD-10-CM

## 2021-09-22 DIAGNOSIS — R55 Syncope and collapse: Secondary | ICD-10-CM

## 2021-09-22 DIAGNOSIS — I10 Essential (primary) hypertension: Secondary | ICD-10-CM

## 2021-09-22 MED ORDER — ASPIRIN EC 81 MG PO TBEC: 81.0000 mg | DELAYED_RELEASE_TABLET | Freq: Every day | ORAL | 3 refills | Status: DC

## 2021-09-22 MED ORDER — FUROSEMIDE 20 MG PO TABS: 20.0000 mg | ORAL_TABLET | Freq: Every day | ORAL | 2 refills | Status: DC | PRN

## 2021-09-22 NOTE — Progress Notes (Signed)
? ? ? ?Clinical Summary ?Roy [name] is a 66 y.o.male seen today for follow up of the following medical problems.  ? ? ? ?1. CAD ?- CABG Jan 2020 LIMA-LAD, SVG-PDA, and Seq-SVG-OM-RI. ?Jan 2021 nuclear stress: Small, mild to moderate intensity, partially reversible apical to basal inferior defect. Partial reversibility most evident in mid to basal segment consistent with ischemic territory. ?  ?- imdur 30 caused orthostatic symptoms in the past, tolerating '15mg'$  daily.  ?  ? ?- sharp pain left chest pains, shooting for just a second.  ?- compliant with meds ? ? ? ?2. Syncope ?- episode occurred 3 week ago. [place] visit father's grave. Was standing for a period, felt like was going to pass out. Denies dehydration history. No palpitations ?- often with orthostatic symptoms.  ?- dr pepper x 5 cans, mild x 1 glass.  ? ?  ?3. HTN ?- compliant with meds ?  ?4. Hyperlipidemia ?- recent labs with pcp ?  ?  ?SH: works in Teacher, music. Currently in between jobs ?  ? ? ?Past Medical History:  ?Diagnosis Date  ? Abdominal pain, right upper quadrant   ? Arthritis   ? "all my joints" (11/28/2013)  ? Blind left eye   ? CAD (coronary artery disease)   ? a. Prior BMS to OM and RCA, subsequent angioplasty due to ISR in these distributions, also BMS placement with DES to LAD. b. Cath 01/2013: diffuse 3V CAD without clear culprit lesion, normal LVEF, mid LAD lesion noted, treat medically. c. Cath 11/28/2013 DES to mid LAD lesion with abnormal FFR, DES to distal LAD lesion, DES to prox RCA d. 07/24/2018:CABG x4 with LIMA-LAD, SVG-PDA, and Seq-SVG-OM-RI.  ? Chronic lower back pain   ? COPD (chronic obstructive pulmonary disease) ([place])   ? DDD (degenerative disc disease)   ? Depression   ? GERD (gastroesophageal reflux disease)   ? Hepatitis C C  ? "had the tx and I'm free of it"  ? History of blood transfusion   ? "when I was taking tx for hepatitis"  ? History of kidney stones   ? Hyperlipidemia   ? Hypertension   ?  Hypothyroidism   ? Kidney stones   ? "no surgeries"  ? Macular degeneration of both eyes   ? "already blind in my left eye"  ? Myocardial infarction [facility]) ~ 2007   ? Other and unspecified angina pectoris   ? Scoliosis   ? Sinus bradycardia   ? ? ? ?Allergies  ?Allergen Reactions  ? Penicillins Other (See Comments)  ?  Chest pain ?PATIENT HAS HAD A PCN REACTION WITH IMMEDIATE RASH, FACIAL/TONGUE/THROAT SWELLING, SOB, OR LIGHTHEADEDNESS WITH HYPOTENSION:  #  #  YES  #  #  ?Has patient had a PCN reaction causing severe rash involving mucus membranes or skin necrosis: no ?Has patient had a PCN reaction that required hospitalization: no ?Has patient had a PCN reaction occurring within the last 10 years: no ?If all of the above answers are "NO", then may proceed with Cephalosporin use. ?  ? Imdur [Isosorbide Nitrate] Other (See Comments)  ?  Has not tolerated well in the past ?UNSPECIFIED REACTION   ? Codeine Itching  ? Food Rash and Other (See Comments)  ?  PEACHES  ? ? ? ?Current Outpatient Medications  ?Medication Sig Dispense Refill  ? amLODipine (NORVASC) 10 MG tablet Take 10 mg by mouth daily.    ? aspirin 325 MG tablet Take 325 mg  by mouth daily.    ? carvedilol (COREG) 6.25 MG tablet Take 6.25 mg by mouth 2 (two) times daily with a meal.    ? cyclobenzaprine (FLEXERIL) 10 MG tablet Take 10 mg by mouth at bedtime.    ? escitalopram (LEXAPRO) 20 MG tablet Take 20 mg by mouth daily.     ? furosemide (LASIX) 20 MG tablet Take 1 tablet (20 mg total) by mouth daily as needed. For weight gain of 3-5 lbs (Patient taking differently: Take 20 mg by mouth daily. For weight gain of 3-5 lbs) 30 tablet   ? gabapentin (NEURONTIN) 300 MG capsule Take 600 mg by mouth 3 (three) times daily.     ? isosorbide mononitrate (IMDUR) 30 MG 24 hr tablet TAKE 1/2 TABLET BY MOUTH ONCE DAILY. 14 tablet 0  ? levothyroxine (SYNTHROID, LEVOTHROID) 75 MCG tablet Take 75 mcg by mouth daily before breakfast.    ? methotrexate 2.5 MG tablet Take 20  mg by mouth once a week. Caution:Chemotherapy. Protect from light.    ? nitroGLYCERIN (NITROSTAT) 0.4 MG SL tablet Place 1 tablet (0.4 mg total) under the tongue every 5 (five) minutes x 3 doses as needed. (Patient taking differently: Place 0.4 mg under the tongue every 5 (five) minutes x 3 doses as needed for chest pain.) 25 tablet 3  ? oxyCODONE-acetaminophen (PERCOCET) 10-325 MG tablet Take 1 tablet by mouth every 6 (six) hours as needed for pain.    ? pantoprazole (PROTONIX) 40 MG tablet Take 40 mg by mouth daily.    ? potassium chloride (K-DUR) 10 MEQ tablet Take 1 tablet (10 mEq total) by mouth daily as needed. On days you take Lasix (Patient taking differently: Take 10 mEq by mouth daily. On days you take Lasix)    ? rosuvastatin (CRESTOR) 20 MG tablet TAKE (1) TABLET BY MOUTH ONCE DAILY. 28 tablet 11  ? tamsulosin (FLOMAX) 0.4 MG CAPS capsule Take 0.4 mg by mouth daily.    ? traZODone (DESYREL) 100 MG tablet Take 100 mg by mouth at bedtime.    ? ?No current facility-administered medications for this visit.  ? ? ? ?Past Surgical History:  ?Procedure Laterality Date  ? BACK SURGERY    ? x3 for scoliosis  ? CARDIAC CATHETERIZATION    ? "I've had a few without intervention"  ? COLONOSCOPY    ? COLONOSCOPY N/A 05/02/2013  ? Procedure: COLONOSCOPY;  Surgeon: Rogene Houston, MD;  [facility];  Service: Endoscopy;  Laterality: N/A;  1030-moved to 1055 Ann to notify pt  ? COLONOSCOPY N/A 07/06/2017  ? Procedure: COLONOSCOPY;  Surgeon: Rogene Houston, MD;  [facility];  Service: Endoscopy;  Laterality: N/A;  12:45  ? CORONARY ANGIOPLASTY WITH STENT PLACEMENT   2007; 2008; 11/28/2013  ? "2 + 2; + 3"  ? CORONARY ARTERY BYPASS GRAFT N/A 07/24/2018  ? Procedure: CORONARY ARTERY BYPASS GRAFTING (CABG);  Surgeon: Gaye Pollack, MD;  Location: [place];  Service: Open Heart Surgery;  Laterality: N/A;  Times 4 using left internal mammary artery and endoscopically harvested right saphenous vein  ?  INGUINAL HERNIA REPAIR Bilateral ~ 2000  ? LEFT HEART CATH AND CORONARY ANGIOGRAPHY N/A 07/12/2018  ? Procedure: LEFT HEART CATH AND CORONARY ANGIOGRAPHY;  Surgeon: Jettie Booze, MD;  Location: [place] [facility];  Service: Cardiovascular;  Laterality: N/A;  ? LEFT HEART CATHETERIZATION WITH CORONARY ANGIOGRAM N/A 01/30/2013  ? Procedure: LEFT HEART CATHETERIZATION WITH CORONARY ANGIOGRAM;  Surgeon:  Jolaine Artist, MD;  Location: [facility] CATH LAB;  Service: Cardiovascular;  Laterality: N/A;  ? LEFT HEART CATHETERIZATION WITH CORONARY ANGIOGRAM N/A 11/28/2013  ? Procedure: LEFT HEART CATHETERIZATION WITH CORONARY ANGIOGRAM;  Surgeon: Jettie Booze, MD;  Location: [facility] CATH LAB;  Service: Cardiovascular;  Laterality: N/A;  ? LIVER BIOPSY  03/24/2010  ? POLYPECTOMY  07/06/2017  ? Procedure: POLYPECTOMY;  Surgeon: Rogene Houston, MD;  [facility];  Service: Endoscopy;;  colon  ? TEE WITHOUT CARDIOVERSION N/A 07/24/2018  ? Procedure: TRANSESOPHAGEAL ECHOCARDIOGRAM (TEE);  Surgeon: Gaye Pollack, MD;  Location: [place];  Service: Open Heart Surgery;  Laterality: N/A;  ? TOOTH EXTRACTION    ? ? ? ?Allergies  ?Allergen Reactions  ? Penicillins Other (See Comments)  ?  Chest pain ?PATIENT HAS HAD A PCN REACTION WITH IMMEDIATE RASH, FACIAL/TONGUE/THROAT SWELLING, SOB, OR LIGHTHEADEDNESS WITH HYPOTENSION:  #  #  YES  #  #  ?Has patient had a PCN reaction causing severe rash involving mucus membranes or skin necrosis: no ?Has patient had a PCN reaction that required hospitalization: no ?Has patient had a PCN reaction occurring within the last 10 years: no ?If all of the above answers are "NO", then may proceed with Cephalosporin use. ?  ? Imdur [Isosorbide Nitrate] Other (See Comments)  ?  Has not tolerated well in the past ?UNSPECIFIED REACTION   ? Codeine Itching  ? Food Rash and Other (See Comments)  ?  PEACHES  ? ? ? ? ?Family History  ?Problem Relation Age of Onset  ? Diabetes Mother   ? Diabetes Brother    ? Heart disease Brother   ? Diabetes Brother   ? Healthy Brother   ? Healthy Son   ? Healthy Son   ? ? ? ?Social History ?Roy [name] reports that he quit smoking about 15 years ago. His smoking use included cigarett

## 2021-09-22 NOTE — Patient Instructions (Signed)
Medication Instructions:  ?Your physician has recommended you make the following change in your medication:  ?Change furosemide to 20 mg daily as needed for swelling ?Decrease aspirin to 81 mg daily ?Continue other medications the same ? ?Labwork: ?none ? ?Testing/Procedures: ?none ? ?Follow-Up: ?Your physician recommends that you schedule a follow-up appointment in: 6 months ? ?Any Other Special Instructions Will Be Listed Below (If Applicable). ? ?If you need a refill on your cardiac medications before your next appointment, please call your pharmacy. ?

## 2021-10-01 ENCOUNTER — Other Ambulatory Visit: Payer: Self-pay | Admitting: Cardiology

## 2021-10-29 DIAGNOSIS — Z87891 Personal history of nicotine dependence: Secondary | ICD-10-CM | POA: Diagnosis not present

## 2021-10-29 DIAGNOSIS — E039 Hypothyroidism, unspecified: Secondary | ICD-10-CM | POA: Diagnosis not present

## 2021-10-29 DIAGNOSIS — Z299 Encounter for prophylactic measures, unspecified: Secondary | ICD-10-CM | POA: Diagnosis not present

## 2021-10-29 DIAGNOSIS — J449 Chronic obstructive pulmonary disease, unspecified: Secondary | ICD-10-CM | POA: Diagnosis not present

## 2021-10-29 DIAGNOSIS — I10 Essential (primary) hypertension: Secondary | ICD-10-CM | POA: Diagnosis not present

## 2021-10-30 ENCOUNTER — Other Ambulatory Visit: Payer: Self-pay | Admitting: Cardiology

## 2021-11-05 DIAGNOSIS — I509 Heart failure, unspecified: Secondary | ICD-10-CM | POA: Diagnosis not present

## 2021-11-05 DIAGNOSIS — Z299 Encounter for prophylactic measures, unspecified: Secondary | ICD-10-CM | POA: Diagnosis not present

## 2021-11-05 DIAGNOSIS — I10 Essential (primary) hypertension: Secondary | ICD-10-CM | POA: Diagnosis not present

## 2021-11-05 DIAGNOSIS — Z789 Other specified health status: Secondary | ICD-10-CM | POA: Diagnosis not present

## 2021-11-23 DIAGNOSIS — I509 Heart failure, unspecified: Secondary | ICD-10-CM | POA: Diagnosis not present

## 2021-11-23 DIAGNOSIS — R7309 Other abnormal glucose: Secondary | ICD-10-CM | POA: Diagnosis not present

## 2021-11-23 DIAGNOSIS — Z299 Encounter for prophylactic measures, unspecified: Secondary | ICD-10-CM | POA: Diagnosis not present

## 2021-11-23 DIAGNOSIS — I10 Essential (primary) hypertension: Secondary | ICD-10-CM | POA: Diagnosis not present

## 2021-11-25 ENCOUNTER — Other Ambulatory Visit: Payer: Self-pay | Admitting: Orthopaedic Surgery

## 2021-11-25 ENCOUNTER — Other Ambulatory Visit: Payer: Self-pay | Admitting: Cardiology

## 2021-12-08 ENCOUNTER — Ambulatory Visit (INDEPENDENT_AMBULATORY_CARE_PROVIDER_SITE_OTHER): Payer: Medicare Other | Admitting: Orthopaedic Surgery

## 2021-12-08 DIAGNOSIS — F1190 Opioid use, unspecified, uncomplicated: Secondary | ICD-10-CM

## 2021-12-09 DIAGNOSIS — Z713 Dietary counseling and surveillance: Secondary | ICD-10-CM | POA: Diagnosis not present

## 2021-12-09 DIAGNOSIS — M549 Dorsalgia, unspecified: Secondary | ICD-10-CM | POA: Diagnosis not present

## 2021-12-09 DIAGNOSIS — I10 Essential (primary) hypertension: Secondary | ICD-10-CM | POA: Diagnosis not present

## 2021-12-09 DIAGNOSIS — Z299 Encounter for prophylactic measures, unspecified: Secondary | ICD-10-CM | POA: Diagnosis not present

## 2021-12-12 DIAGNOSIS — F1190 Opioid use, unspecified, uncomplicated: Secondary | ICD-10-CM | POA: Insufficient documentation

## 2021-12-12 NOTE — Progress Notes (Signed)
66 year old new patient visit has previously been followed by Dr. Assunta Curtis patient states Dr. Assunta Curtis retired but in reality Dr. Assunta Curtis is just moved from one office to another.  He has had previous surgeries and has been on chronic narcotic medication Percocet 10/325    90 tablets monthly.  Patient has a few tablets left I discussed with patient we do not do chronic pain management.  No charge for office visit.  Patient is ambulatory.

## 2021-12-14 DIAGNOSIS — I10 Essential (primary) hypertension: Secondary | ICD-10-CM | POA: Diagnosis not present

## 2021-12-14 DIAGNOSIS — Z299 Encounter for prophylactic measures, unspecified: Secondary | ICD-10-CM | POA: Diagnosis not present

## 2021-12-14 DIAGNOSIS — I509 Heart failure, unspecified: Secondary | ICD-10-CM | POA: Diagnosis not present

## 2021-12-14 DIAGNOSIS — M549 Dorsalgia, unspecified: Secondary | ICD-10-CM | POA: Diagnosis not present

## 2021-12-23 DIAGNOSIS — I700 Atherosclerosis of aorta: Secondary | ICD-10-CM | POA: Diagnosis not present

## 2021-12-23 DIAGNOSIS — I509 Heart failure, unspecified: Secondary | ICD-10-CM | POA: Diagnosis not present

## 2021-12-23 DIAGNOSIS — Z299 Encounter for prophylactic measures, unspecified: Secondary | ICD-10-CM | POA: Diagnosis not present

## 2021-12-23 DIAGNOSIS — M549 Dorsalgia, unspecified: Secondary | ICD-10-CM | POA: Diagnosis not present

## 2021-12-23 DIAGNOSIS — I10 Essential (primary) hypertension: Secondary | ICD-10-CM | POA: Diagnosis not present

## 2021-12-30 DIAGNOSIS — Z299 Encounter for prophylactic measures, unspecified: Secondary | ICD-10-CM | POA: Diagnosis not present

## 2021-12-30 DIAGNOSIS — I10 Essential (primary) hypertension: Secondary | ICD-10-CM | POA: Diagnosis not present

## 2021-12-30 DIAGNOSIS — I509 Heart failure, unspecified: Secondary | ICD-10-CM | POA: Diagnosis not present

## 2021-12-30 DIAGNOSIS — M549 Dorsalgia, unspecified: Secondary | ICD-10-CM | POA: Diagnosis not present

## 2021-12-30 DIAGNOSIS — M069 Rheumatoid arthritis, unspecified: Secondary | ICD-10-CM | POA: Diagnosis not present

## 2022-01-26 DIAGNOSIS — I509 Heart failure, unspecified: Secondary | ICD-10-CM | POA: Diagnosis not present

## 2022-01-26 DIAGNOSIS — Z299 Encounter for prophylactic measures, unspecified: Secondary | ICD-10-CM | POA: Diagnosis not present

## 2022-01-26 DIAGNOSIS — I10 Essential (primary) hypertension: Secondary | ICD-10-CM | POA: Diagnosis not present

## 2022-01-26 DIAGNOSIS — M549 Dorsalgia, unspecified: Secondary | ICD-10-CM | POA: Diagnosis not present

## 2022-02-25 DIAGNOSIS — I10 Essential (primary) hypertension: Secondary | ICD-10-CM | POA: Diagnosis not present

## 2022-02-25 DIAGNOSIS — J449 Chronic obstructive pulmonary disease, unspecified: Secondary | ICD-10-CM | POA: Diagnosis not present

## 2022-02-25 DIAGNOSIS — I25119 Atherosclerotic heart disease of native coronary artery with unspecified angina pectoris: Secondary | ICD-10-CM | POA: Diagnosis not present

## 2022-02-25 DIAGNOSIS — Z0000 Encounter for general adult medical examination without abnormal findings: Secondary | ICD-10-CM | POA: Diagnosis not present

## 2022-02-25 DIAGNOSIS — I509 Heart failure, unspecified: Secondary | ICD-10-CM | POA: Diagnosis not present

## 2022-02-25 DIAGNOSIS — Z299 Encounter for prophylactic measures, unspecified: Secondary | ICD-10-CM | POA: Diagnosis not present

## 2022-03-09 ENCOUNTER — Encounter: Payer: Self-pay | Admitting: Cardiology

## 2022-03-09 DIAGNOSIS — M069 Rheumatoid arthritis, unspecified: Secondary | ICD-10-CM | POA: Diagnosis not present

## 2022-03-25 DIAGNOSIS — Z23 Encounter for immunization: Secondary | ICD-10-CM | POA: Diagnosis not present

## 2022-03-25 DIAGNOSIS — Z0000 Encounter for general adult medical examination without abnormal findings: Secondary | ICD-10-CM | POA: Diagnosis not present

## 2022-03-25 DIAGNOSIS — I10 Essential (primary) hypertension: Secondary | ICD-10-CM | POA: Diagnosis not present

## 2022-03-25 DIAGNOSIS — Z7189 Other specified counseling: Secondary | ICD-10-CM | POA: Diagnosis not present

## 2022-03-25 DIAGNOSIS — M069 Rheumatoid arthritis, unspecified: Secondary | ICD-10-CM | POA: Diagnosis not present

## 2022-03-25 DIAGNOSIS — I700 Atherosclerosis of aorta: Secondary | ICD-10-CM | POA: Diagnosis not present

## 2022-03-25 DIAGNOSIS — G4700 Insomnia, unspecified: Secondary | ICD-10-CM | POA: Diagnosis not present

## 2022-03-25 DIAGNOSIS — Z299 Encounter for prophylactic measures, unspecified: Secondary | ICD-10-CM | POA: Diagnosis not present

## 2022-03-28 ENCOUNTER — Encounter: Payer: Self-pay | Admitting: Cardiology

## 2022-03-28 ENCOUNTER — Ambulatory Visit: Payer: Medicare Other | Attending: Cardiology | Admitting: Cardiology

## 2022-03-28 VITALS — BP 120/76 | HR 74 | Ht 69.0 in | Wt 187.8 lb

## 2022-03-28 DIAGNOSIS — E782 Mixed hyperlipidemia: Secondary | ICD-10-CM | POA: Diagnosis not present

## 2022-03-28 DIAGNOSIS — I10 Essential (primary) hypertension: Secondary | ICD-10-CM | POA: Diagnosis not present

## 2022-03-28 DIAGNOSIS — R55 Syncope and collapse: Secondary | ICD-10-CM

## 2022-03-28 DIAGNOSIS — I2510 Atherosclerotic heart disease of native coronary artery without angina pectoris: Secondary | ICD-10-CM

## 2022-03-28 DIAGNOSIS — R0602 Shortness of breath: Secondary | ICD-10-CM

## 2022-03-28 MED ORDER — ASPIRIN 81 MG PO TBEC: 81.0000 mg | DELAYED_RELEASE_TABLET | Freq: Every day | ORAL | 1 refills | Status: AC

## 2022-03-28 NOTE — Patient Instructions (Signed)
Medication Instructions:  Your physician has recommended you make the following change in your medication:  Start aspirin 81 mg once a day Continue all other medications as directed  Labwork: none  Testing/Procedures: Your physician has requested that you have an echocardiogram. Echocardiography is a painless test that uses sound waves to create images of your heart. It provides your doctor with information about the size and shape of your heart and how well your heart's chambers and valves are working. This procedure takes approximately one hour. There are no restrictions for this procedure.   Follow-Up:  Your physician recommends that you schedule a follow-up appointment in: 6 months  Any Other Special Instructions Will Be Listed Below (If Applicable).  If you need a refill on your cardiac medications before your next appointment, please call your pharmacy.

## 2022-03-28 NOTE — Progress Notes (Signed)
Clinical Summary [name] is a 66 y.o.male seen today for follow up of the following medical problems.        1. CAD - CABG Jan 2020 LIMA-LAD, SVG-PDA, and Seq-SVG-OM-RI. Jan 2021 nuclear stress: Small, mild to moderate intensity, partially reversible apical to basal inferior defect. Partial reversibility most evident in mid to basal segment consistent with ischemic territory.   - imdur 30 caused orthostatic symptoms in the past, tolerating '15mg'$  daily.        -no chest pains. Some recent SOB/DOE with activities. No recent edema.  - compliant with meds     2. Syncope - episode occurred 3 week ago. [place] visit father's grave. Was standing for a period, felt like was going to pass out. Denies dehydration history. No palpitations - often with orthostatic symptoms.  - dr pepper x 5 cans, milk x 1 glass.   - no recurrent episodes. Working to increase his hydration.      3. HTN - compliant with meds   4. Hyperlipidemia - recent labs with pcp     SH: works in Teacher, music. Currently in between jobs   Past Medical History:  Diagnosis Date   Abdominal pain, right upper quadrant    Arthritis    "all my joints" (11/28/2013)   Blind left eye    CAD (coronary artery disease)    a. Prior BMS to OM and RCA, subsequent angioplasty due to ISR in these distributions, also BMS placement with DES to LAD. b. Cath 01/2013: diffuse 3V CAD without clear culprit lesion, normal LVEF, mid LAD lesion noted, treat medically. c. Cath 11/28/2013 DES to mid LAD lesion with abnormal FFR, DES to distal LAD lesion, DES to prox RCA d. 07/24/2018:CABG x4 with LIMA-LAD, SVG-PDA, and Seq-SVG-OM-RI.   Chronic lower back pain    COPD (chronic obstructive pulmonary disease) (HCC)    DDD (degenerative disc disease)    Depression    GERD (gastroesophageal reflux disease)    Hepatitis C C   "had the tx and I'm free of it"   History of blood transfusion    "when I was taking tx for  hepatitis"   History of kidney stones    Hyperlipidemia    Hypertension    Hypothyroidism    Kidney stones    "no surgeries"   Macular degeneration of both eyes    "already blind in my left eye"   Myocardial infarction [facility]) ~ 2007    Other and unspecified angina pectoris    Scoliosis    Sinus bradycardia      Allergies  Allergen Reactions   Penicillins Other (See Comments)    Chest pain PATIENT HAS HAD A PCN REACTION WITH IMMEDIATE RASH, FACIAL/TONGUE/THROAT SWELLING, SOB, OR LIGHTHEADEDNESS WITH HYPOTENSION:  #  #  YES  #  #  Has patient had a PCN reaction causing severe rash involving mucus membranes or skin necrosis: no Has patient had a PCN reaction that required hospitalization: no Has patient had a PCN reaction occurring within the last 10 years: no If all of the above answers are "NO", then may proceed with Cephalosporin use.    Imdur [Isosorbide Nitrate] Other (See Comments)    Has not tolerated well in the past UNSPECIFIED REACTION    Codeine Itching   Food Rash and Other (See Comments)    PEACHES     Current Outpatient Medications  Medication Sig Dispense Refill   albuterol (VENTOLIN HFA) 108 (  90 Base) MCG/ACT inhaler Inhale 2 puffs into the lungs every 6 (six) hours as needed.     aspirin EC 81 MG tablet Take 1 tablet (81 mg total) by mouth daily. Swallow whole. 90 tablet 3   carvedilol (COREG) 6.25 MG tablet Take 3.125 mg by mouth 2 (two) times daily with a meal.     escitalopram (LEXAPRO) 20 MG tablet Take 20 mg by mouth daily.      folic acid (FOLVITE) 1 MG tablet Take 1 mg by mouth 2 (two) times daily.     furosemide (LASIX) 20 MG tablet TAKE 1 TABLET ONCE A DAY AS NEEDED FOR SWELLING. 28 tablet 6   gabapentin (NEURONTIN) 300 MG capsule Take 600 mg by mouth 3 (three) times daily.      isosorbide mononitrate (IMDUR) 30 MG 24 hr tablet Take 0.5 tablets (15 mg total) by mouth daily. 45 tablet 1   levothyroxine (SYNTHROID, LEVOTHROID) 75 MCG tablet Take 75 mcg  by mouth daily before breakfast.     methotrexate 2.5 MG tablet Take 25 mg by mouth once a week. Caution:Chemotherapy. Protect from light.     nitroGLYCERIN (NITROSTAT) 0.4 MG SL tablet Place 1 tablet (0.4 mg total) under the tongue every 5 (five) minutes x 3 doses as needed. (Patient taking differently: Place 0.4 mg under the tongue every 5 (five) minutes x 3 doses as needed for chest pain.) 25 tablet 3   oxyCODONE-acetaminophen (PERCOCET) 10-325 MG tablet Take 1 tablet by mouth every 6 (six) hours as needed for pain.     pantoprazole (PROTONIX) 40 MG tablet Take 40 mg by mouth daily.     potassium chloride (KLOR-CON M) 10 MEQ tablet Take 10 mEq by mouth 2 (two) times daily.     rosuvastatin (CRESTOR) 5 MG tablet Take 5 mg by mouth daily.     sucralfate (CARAFATE) 1 g tablet Take 1 g by mouth daily.     tamsulosin (FLOMAX) 0.4 MG CAPS capsule Take 0.4 mg by mouth daily.     traZODone (DESYREL) 100 MG tablet Take 100 mg by mouth at bedtime.     No current facility-administered medications for this visit.     Past Surgical History:  Procedure Laterality Date   BACK SURGERY     x3 for scoliosis   CARDIAC CATHETERIZATION     "I've had a few without intervention"   COLONOSCOPY     COLONOSCOPY N/A 05/02/2013   Procedure: COLONOSCOPY;  Surgeon: Rogene Houston, MD;  [facility];  Service: Endoscopy;  Laterality: N/A;  1030-moved to 1055 Ann to notify pt   COLONOSCOPY N/A 07/06/2017   Procedure: COLONOSCOPY;  Surgeon: Rogene Houston, MD;  [facility];  Service: Endoscopy;  Laterality: N/A;  12:45   CORONARY ANGIOPLASTY WITH STENT PLACEMENT   2007; 2008; 11/28/2013   "2 + 2; + 3"   CORONARY ARTERY BYPASS GRAFT N/A 07/24/2018   Procedure: CORONARY ARTERY BYPASS GRAFTING (CABG);  Surgeon: Gaye Pollack, MD;  Location: [facility];  Service: Open Heart Surgery;  Laterality: N/A;  Times 4 using left internal mammary artery and endoscopically harvested right saphenous vein    INGUINAL HERNIA REPAIR Bilateral ~ 2000   LEFT HEART CATH AND CORONARY ANGIOGRAPHY N/A 07/12/2018   Procedure: LEFT HEART CATH AND CORONARY ANGIOGRAPHY;  Surgeon: Jettie Booze, MD;  Location: [facility];  Service: Cardiovascular;  Laterality: N/A;   LEFT HEART CATHETERIZATION WITH CORONARY ANGIOGRAM N/A 01/30/2013  Procedure: LEFT HEART CATHETERIZATION WITH CORONARY ANGIOGRAM;  Surgeon: Jolaine Artist, MD;  Location: [facility];  Service: Cardiovascular;  Laterality: N/A;   LEFT HEART CATHETERIZATION WITH CORONARY ANGIOGRAM N/A 11/28/2013   Procedure: LEFT HEART CATHETERIZATION WITH CORONARY ANGIOGRAM;  Surgeon: Jettie Booze, MD;  Location: [facility] C[facility];  Service: Cardiovascular;  Laterality: N/A;   LIVER BIOPSY  03/24/2010   POLYPECTOMY  07/06/2017   Procedure: POLYPECTOMY;  Surgeon: Rogene Houston, MD;  [facility];  Service: Endoscopy;;  colon   TEE WITHOUT CARDIOVERSION N/A 07/24/2018   Procedure: TRANSESOPHAGEAL ECHOCARDIOGRAM (TEE);  Surgeon: Gaye Pollack, MD;  Location: [facility];  Service: Open Heart Surgery;  Laterality: N/A;   TOOTH EXTRACTION       Allergies  Allergen Reactions   Penicillins Other (See Comments)    Chest pain PATIENT HAS HAD A PCN REACTION WITH IMMEDIATE RASH, FACIAL/TONGUE/THROAT SWELLING, SOB, OR LIGHTHEADEDNESS WITH HYPOTENSION:  #  #  YES  #  #  Has patient had a PCN reaction causing severe rash involving mucus membranes or skin necrosis: no Has patient had a PCN reaction that required hospitalization: no Has patient had a PCN reaction occurring within the last 10 years: no If all of the above answers are "NO", then may proceed with Cephalosporin use.    Imdur [Isosorbide Nitrate] Other (See Comments)    Has not tolerated well in the past UNSPECIFIED REACTION    Codeine Itching   Food Rash and Other (See Comments)    PEACHES      Family History  Problem Relation Age of Onset   Diabetes Mother    Diabetes Brother     Heart disease Brother    Diabetes Brother    Healthy Brother    Healthy Son    Healthy Son      Social History Mr. Springsteen reports that he quit smoking about 16 years ago. His smoking use included cigarettes. He started smoking about 50 years ago. He has a 52.50 pack-year smoking history. He has never used smokeless tobacco. Mr. Westcott reports no history of alcohol use.   Review of Systems CONSTITUTIONAL: No weight loss, fever, chills, weakness or fatigue.  HEENT: Eyes: No visual loss, blurred vision, double vision or yellow sclerae.No hearing loss, sneezing, congestion, runny nose or sore throat.  SKIN: No rash or itching.  CARDIOVASCULAR: per hpi RESPIRATORY: [per hpi GASTROINTESTINAL: No anorexia, nausea, vomiting or diarrhea. No abdominal pain or blood.  GENITOURINARY: No burning on urination, no polyuria NEUROLOGICAL: No headache, dizziness, syncope, paralysis, ataxia, numbness or tingling in the extremities. No change in bowel or bladder control.  MUSCULOSKELETAL: No muscle, back pain, joint pain or stiffness.  LYMPHATICS: No enlarged nodes. No history of splenectomy.  PSYCHIATRIC: No history of depression or anxiety.  ENDOCRINOLOGIC: No reports of sweating, cold or heat intolerance. No polyuria or polydipsia.  [name]   Physical Examination Today's Vitals   03/28/22 0815  BP: 120/76  Pulse: 74  SpO2: 98%  Weight: 187 lb 12.8 oz (85.2 kg)  Height: '5\' 9"'$  (1.753 m)   Body mass index is 27.73 kg/m.  Gen: resting comfortably, no acute distress HEENT: no scleral icterus, pupils equal round and reactive, no palptable cervical adenopathy,  CV: RRR, no m/rg no jvd Resp: Clear to auscultation bilaterally GI: abdomen is soft, non-tender, non-distended, normal bowel sounds, no hepatosplenomegaly MSK: extremities are warm, no edema.  Skin: warm, no rash Neuro:  no focal deficits Psych: appropriate affect  Diagnostic Studies Cardiac Catheterization: 2018/08/11 Previously  placed Prox RCA to Mid RCA stent (unknown type) is widely patent. Mid LM to Dist LM lesion is 40% stenosed. Mid RCA lesion is 10% stenosed. Ost Ramus to Ramus lesion is 95% stenosed. Ost 1st Mrg to 1st Mrg lesion is 95% stenosed. Ost LAD lesion is 50% stenosed. Previously placed Prox LAD stent (unknown type) is widely patent. Ost 1st Diag to 1st Diag lesion is 99% stenosed. Ost 2nd Diag to 2nd Diag lesion is 70% stenosed. Mid LAD-1 lesion is 75% stenosed. Previously placed Mid LAD-2 stent (unknown type) is widely patent. Dist RCA lesion is 25% stenosed. Dist Cx lesion is 90% stenosed. The left ventricular systolic function is normal. LV end diastolic pressure is normal. The left ventricular ejection fraction is 55-65% by visual estimate. There is no aortic valve stenosis.   Severe multivessel CAD.  Multiple branches involved and moderate distal left main and ostial LAD disease as well.  Severe mid LAD disease.  CTO of large diagonal Vesta Wheeland.    When compared to the 2015 cath, there has been significant progression of the mid LAD disease, Izzabell Klasen vessel disease and mild progression of the distal left main disease.   Plan for Cardiac surgery consult for CABG.     TEE: 07/24/2018 Aortic valve: The valve is trileaflet. No stenosis. No regurgitation. Right ventricle: Normal cavity size, wall thickness and ejection fraction. Mitral valve: Trace regurgitation      Assessment and Plan  1. CAD - no chest pains - some recent SOB/DOE, will update echo. If benign would consider pulmonary referal given smoking history and prior abnormal PFTs, just on prn albuterol - EK [facility] SR, no ischemic changes   2. HTN - at goal, continue current meds   3. Hyperlipidemia -continue current meds, request pcp labs.    4. Syncope - isolated episode, suspect orthostatic syncope in setting of poor hydration and medications including diuretic, bp meds, alpha one blocker for prostate.  -no recurrence,  working to stay hydarted      Arnoldo Lenis, M.D.

## 2022-03-30 ENCOUNTER — Ambulatory Visit: Payer: Medicare Other | Attending: Cardiology

## 2022-03-30 DIAGNOSIS — R0602 Shortness of breath: Secondary | ICD-10-CM

## 2022-03-31 LAB — ECHOCARDIOGRAM COMPLETE
AR max vel: 1.68 cm2
AV Area VTI: 1.73 cm2
AV Area mean vel: 1.78 cm2
AV Mean grad: 4.7 mmHg
AV Peak grad: 9.6 mmHg
Ao pk vel: 1.55 m/s
Area-P 1/2: 3.39 cm2
Calc EF: 60.7 %
MV M vel: 2.76 m/s
MV Peak grad: 30.5 mmHg
S' Lateral: 2.38 cm
Single Plane A2C EF: 61.3 %
Single Plane A4C EF: 60.7 %

## 2022-04-05 ENCOUNTER — Telehealth: Payer: Self-pay | Admitting: Cardiology

## 2022-04-05 DIAGNOSIS — R0602 Shortness of breath: Secondary | ICD-10-CM

## 2022-04-05 NOTE — Telephone Encounter (Signed)
Patient informed and verbalized understanding of plan. Copy sent to PCP 

## 2022-04-05 NOTE — Telephone Encounter (Signed)
Patient is calling requesting his echo results. Please advise.  

## 2022-04-05 NOTE — Telephone Encounter (Signed)
-----   Message from Arnoldo Lenis, MD sent at 04/04/2022 12:05 PM EDT ----- Echo shows normal heart pumping function, no significant findings that would contribute to his SOB. Can we refer to pulmonary please   Zandra Abts MD

## 2022-04-15 ENCOUNTER — Other Ambulatory Visit: Payer: Self-pay | Admitting: Cardiology

## 2022-04-20 DIAGNOSIS — I10 Essential (primary) hypertension: Secondary | ICD-10-CM | POA: Diagnosis not present

## 2022-04-20 DIAGNOSIS — E860 Dehydration: Secondary | ICD-10-CM | POA: Diagnosis not present

## 2022-04-20 DIAGNOSIS — R55 Syncope and collapse: Secondary | ICD-10-CM | POA: Diagnosis not present

## 2022-04-20 DIAGNOSIS — Z299 Encounter for prophylactic measures, unspecified: Secondary | ICD-10-CM | POA: Diagnosis not present

## 2022-05-04 DIAGNOSIS — Z299 Encounter for prophylactic measures, unspecified: Secondary | ICD-10-CM | POA: Diagnosis not present

## 2022-05-04 DIAGNOSIS — I10 Essential (primary) hypertension: Secondary | ICD-10-CM | POA: Diagnosis not present

## 2022-05-04 DIAGNOSIS — R55 Syncope and collapse: Secondary | ICD-10-CM | POA: Diagnosis not present

## 2022-05-12 ENCOUNTER — Encounter: Payer: Self-pay | Admitting: Internal Medicine

## 2022-05-12 ENCOUNTER — Ambulatory Visit (INDEPENDENT_AMBULATORY_CARE_PROVIDER_SITE_OTHER): Payer: Medicare Other | Admitting: Internal Medicine

## 2022-05-12 ENCOUNTER — Ambulatory Visit (HOSPITAL_COMMUNITY)
Admission: RE | Admit: 2022-05-12 | Discharge: 2022-05-12 | Disposition: A | Discharge status: Home / Self Care | Payer: Medicare Other | Source: Ambulatory Visit | Attending: Internal Medicine | Admitting: Internal Medicine

## 2022-05-12 VITALS — BP 130/68 | HR 68 | Temp 98.2°F | Ht 69.0 in | Wt 196.4 lb

## 2022-05-12 DIAGNOSIS — R0609 Other forms of dyspnea: Secondary | ICD-10-CM | POA: Diagnosis not present

## 2022-05-12 DIAGNOSIS — R0602 Shortness of breath: Secondary | ICD-10-CM | POA: Diagnosis not present

## 2022-05-12 NOTE — Progress Notes (Signed)
Roy [name], male    DOB: 08-Dec-1955    MRN: [id number]   Brief patient profile:  50   yowm with RA on mtx x 2020  quit smoking 2007 with 1st heart attack then cabg  07/2018  referred to pulmonary clinic in [place]  05/12/2022 by Dr Harl Bowie  for unexplained sob onset was spring 2023 per pt but per EMR reported in fall of 2020  (? Correlation with dx RA/ use of MTX)  at wt 178   History of Present Illness  05/12/2022  Pulmonary/ 1st office eval/ [facility] / [place] [facility] on mtx @ at wt 53  Chief Complaint  Patient presents with   Consult    SOB w activity    Dyspnea:  walking uphill with trash can/ weed eating x 6 months Cough: none  Sleep: flat bed  2 pillows  SABA use: not helping  02: none   No obvious day to day or daytime pattern/variability or assoc excess/ purulent sputum or mucus plugs or hemoptysis or cp or chest tightness, subjective wheeze or overt sinus or hb symptoms.   Sleeping  without nocturnal  or early am exacerbation  of respiratory  c/o's or need for noct saba. Also denies any obvious fluctuation of symptoms with weather or environmental changes or other aggravating or alleviating factors except as outlined above   No unusual exposure hx or h/o childhood pna/ asthma or knowledge of premature birth.  Current Allergies, Complete Past Medical History, Past Surgical History, Family History, and Social History were reviewed in [employer] record.  ROS  The following are not active complaints unless bolded Hoarseness, sore throat, dysphagia, dental problems, itching, sneezing,  nasal congestion or discharge of excess mucus or purulent secretions, ear ache,   fever, chills, sweats, unintended wt loss or wt gain, classically pleuritic or exertional cp,  orthopnea pnd or arm/hand swelling  or leg swelling, presyncope, palpitations, abdominal pain, anorexia, nausea, vomiting, diarrhea  or change in bowel habits or change in bladder habits, change in  stools or change in urine, dysuria, hematuria,  rash, arthralgias, visual complaints, headache, numbness, weakness or ataxia or problems with walking or coordination,  change in mood or  memory.             Past Medical History:  Diagnosis Date   Abdominal pain, right upper quadrant    Arthritis    "all my joints" (11/28/2013)   Blind left eye    CAD (coronary artery disease)    a. Prior BMS to OM and RCA, subsequent angioplasty due to ISR in these distributions, also BMS placement with DES to LAD. b. Cath 01/2013: diffuse 3V CAD without clear culprit lesion, normal LVEF, mid LAD lesion noted, treat medically. c. Cath 11/28/2013 DES to mid LAD lesion with abnormal FFR, DES to distal LAD lesion, DES to prox RCA d. 07/24/2018:CABG x4 with LIMA-LAD, SVG-PDA, and Seq-SVG-OM-RI.   Chronic lower back pain    COPD (chronic obstructive pulmonary disease) (HCC)    DDD (degenerative disc disease)    Depression    GERD (gastroesophageal reflux disease)    Hepatitis C C   "had the tx and I'm free of it"   History of blood transfusion    "when I was taking tx for hepatitis"   History of kidney stones    Hyperlipidemia    Hypertension    Hypothyroidism    Kidney stones    "no surgeries"   Macular degeneration of both  eyes    "already blind in my left eye"   Myocardial infarction [facility]) ~ 2007    Other and unspecified angina pectoris    Scoliosis    Sinus bradycardia     Outpatient Medications Prior to Visit  Medication Sig Dispense Refill   albuterol (VENTOLIN HFA) 108 (90 Base) MCG/ACT inhaler Inhale 2 puffs into the lungs every 6 (six) hours as needed.     aspirin EC 81 MG tablet Take 1 tablet (81 mg total) by mouth daily. Swallow whole. 90 tablet 1   carvedilol (COREG) 6.25 MG tablet Take 3.125 mg by mouth 2 (two) times daily with a meal.     folic acid (FOLVITE) 1 MG tablet Take 1 mg by mouth 2 (two) times daily.     furosemide (LASIX) 20 MG tablet TAKE 1 TABLET ONCE A DAY AS NEEDED FOR  SWELLING. 28 tablet 6   gabapentin (NEURONTIN) 300 MG capsule Take 600 mg by mouth 3 (three) times daily.      isosorbide mononitrate (IMDUR) 30 MG 24 hr tablet Take 0.5 tablets (15 mg total) by mouth daily. 15 tablet 2   levothyroxine (SYNTHROID, LEVOTHROID) 75 MCG tablet Take 75 mcg by mouth daily before breakfast.     methotrexate 2.5 MG tablet Take 25 mg by mouth once a week. Caution:Chemotherapy. Protect from light.     nitroGLYCERIN (NITROSTAT) 0.4 MG SL tablet Place 1 tablet (0.4 mg total) under the tongue every 5 (five) minutes x 3 doses as needed. (Patient taking differently: Place 0.4 mg under the tongue every 5 (five) minutes x 3 doses as needed for chest pain.) 25 tablet 3   oxyCODONE (OXY IR/ROXICODONE) 5 MG immediate release tablet Take 5 mg by mouth 3 (three) times daily as needed.     pantoprazole (PROTONIX) 40 MG tablet Take 40 mg by mouth daily.     potassium chloride (KLOR-CON M) 10 MEQ tablet Take 10 mEq by mouth 2 (two) times daily.     rosuvastatin (CRESTOR) 5 MG tablet Take 5 mg by mouth daily.     sucralfate (CARAFATE) 1 g tablet Take 1 g by mouth daily.     tamsulosin (FLOMAX) 0.4 MG CAPS capsule Take 0.4 mg by mouth daily.     traZODone (DESYREL) 100 MG tablet Take 100 mg by mouth at bedtime.     No facility-administered medications prior to visit.     Objective:     BP 130/68   Pulse 68   Temp 98.2 F (36.8 C)   Ht '5\' 9"'$  (1.753 m)   Wt 196 lb 6.4 oz (89.1 kg)   SpO2 98% Comment: ra  BMI 29.00 kg/m   SpO2: 98 % (ra)  Amb pleasant wm and   HEENT : Oropharynx  clear/ dentition poor      Nasal turbinates nl    NECK :  without  apparent JVD/ palpable Nodes/TM    LUNGS: no acc muscle use,  Nl contour chest which is clear to A and P bilaterally without cough on insp or exp maneuvers   CV:  RRR  no s3 or murmur or increase in P2, and no edema   ABD: mod obese/ soft and nontender with nl inspiratory excursion in the supine position. No bruits or  organomegaly appreciated   MS:  Nl gait/ ext warm without deformities Or obvious joint restrictions  calf tenderness, cyanosis or clubbing    SKIN: warm and dry without lesions    NEURO:  alert,  approp, nl sensorium with  no motor or cerebellar deficits apparent.      CXR PA and Lateral:   05/12/2022 :    I personally reviewed images and agree with radiology impression as follows:   No acute abnormalities.  My impression:  no ILD    Assessment   DOE (dyspnea on exertion) Quit smoking 2007 / RA on MTX since around 2020 AT wt 278 - PFTs 1/07/10/18 ERV 34% at wt 180 and dlco 18.20 (58%) with nl f/v and spirometry  - Echo  03/30/22 ok  - 05/12/2022  wt 296  Walked on RA  x  3  lap(s) =  approx 450  ft  @ fast pace, stopped due to end of study  with lowest 02 sats 98% no sob   Symptoms are  disproportionate to objective findings and not clear to what extent this is actually a pulmonary  problem but pt does appear to have difficult to sort out respiratory symptoms of unknown origin for which  DDX  = almost all start with A and  include Adherence, Ace Inhibitors, Acid Reflux, Active Sinus Disease, Alpha 1 Antitripsin deficiency, Anxiety masquerading as Airways dz,  ABPA,  Allergy(esp in young), Aspiration (esp in elderly), Adverse effects of meds,  Active smoking or Vaping, A bunch of PE's/clot burden (a few small clots can't cause this syndrome unless there is already severe underlying pulm or vascular dz with poor reserve),  Anemia or thyroid disorder, plus two Bs  = Bronchiectasis and Beta blocker use..and one C= CHF    ? Adverse drug effects > nothing to support mtx toxicity nor for that matter RA lung dz at [facility] point  ? Allergy / asthma > no better with saba/ no noct or rhinitis symptoms assoc  ? Acid (or non-acid) GERD > always difficult to exclude as up to 75% of pts in some series report no assoc GI/ Heartburn symptoms> rec continue acid suppression and diet restrictions/ reviewed     ?  Anxiety /depression/ deconditioning assoc with 20 lb wt gain > usually at the bottom of this list of usual suspects but may interfere with adherence and also interpretation of response or lack thereof to symptom management which can be quite subjective.   ? A bunch of PEs > not supported by walking study today / neg R ht findings on echo    ? CHF > already eval by cards    Rec: Regular sub max ex/ check sats at peak ex and recheck pfts with f/u in 3 m  Each maintenance medication was reviewed in detail including emphasizing most importantly the difference between maintenance and prns and under what circumstances the prns are to be triggered using an action plan format where appropriate.  Total time for H and P, chart review, counseling,  directly observing portions of ambulatory 02 saturation study/ and generating customized AVS unique to this office visit / same day charting = 47 min                      Christinia Gully, MD 05/12/2022

## 2022-05-12 NOTE — Patient Instructions (Addendum)
To get the most out of exercise, you need to be continuously aware that you are short of breath, but never out of breath, building up to 30 minutes daily. As you improve, it will actually be easier for you to do the same amount of exercise  in  30 minutes so always push to the level where you are short of breath.      Make sure you check your oxygen saturations at highest level of activity    Please remember to go to the  x-ray department  @  [facility] for your tests - we will call you with the results when they are available      Please schedule a follow up visit in 3 months but call sooner if needed with pfts on return

## 2022-05-13 ENCOUNTER — Encounter: Payer: Self-pay | Admitting: Internal Medicine

## 2022-05-13 NOTE — Assessment & Plan Note (Addendum)
Quit smoking 2007 / RA on MTX since around 2020 AT wt 278 - PFTs 1/07/10/18 ERV 34% at wt 180 and dlco 18.20 (58%) with nl f/v and spirometry  - Echo  03/30/22 ok  - 05/12/2022  wt 296  Walked on RA  x  3  lap(s) =  approx 450  ft  @ fast pace, stopped due to end of study  with lowest 02 sats 98% no sob   Symptoms are  disproportionate to objective findings and not clear to what extent this is actually a pulmonary  problem but pt does appear to have difficult to sort out respiratory symptoms of unknown origin for which  DDX  = almost all start with A and  include Adherence, Ace Inhibitors, Acid Reflux, Active Sinus Disease, Alpha 1 Antitripsin deficiency, Anxiety masquerading as Airways dz,  ABPA,  Allergy(esp in young), Aspiration (esp in elderly), Adverse effects of meds,  Active smoking or Vaping, A bunch of PE's/clot burden (a few small clots can't cause this syndrome unless there is already severe underlying pulm or vascular dz with poor reserve),  Anemia or thyroid disorder, plus two Bs  = Bronchiectasis and Beta blocker use..and one C= CHF    ? Adverse drug effects > nothing to support mtx toxicity nor for that matter RA lung dz at [facility] point  ? Allergy / asthma > no better with saba/ no noct or rhinitis symptoms assoc  ? Acid (or non-acid) GERD > always difficult to exclude as up to 75% of pts in some series report no assoc GI/ Heartburn symptoms> rec continue acid suppression and diet restrictions/ reviewed     ? Anxiety /depression/ deconditioning assoc with 20 lb wt gain > usually at the bottom of this list of usual suspects but may interfere with adherence and also interpretation of response or lack thereof to symptom management which can be quite subjective.   ? A bunch of PEs > not supported by walking study today / neg R ht findings on echo    ? CHF > already eval by cards    Rec: Regular sub max ex/ check sats at peak ex and recheck pfts with f/u in 3 m  Each maintenance medication  was reviewed in detail including emphasizing most importantly the difference between maintenance and prns and under what circumstances the prns are to be triggered using an action plan format where appropriate.  Total time for H and P, chart review, counseling,  directly observing portions of ambulatory 02 saturation study/ and generating customized AVS unique to this office visit / same day charting = 47 min

## 2022-05-30 DIAGNOSIS — Z299 Encounter for prophylactic measures, unspecified: Secondary | ICD-10-CM | POA: Diagnosis not present

## 2022-05-30 DIAGNOSIS — I10 Essential (primary) hypertension: Secondary | ICD-10-CM | POA: Diagnosis not present

## 2022-05-30 DIAGNOSIS — M069 Rheumatoid arthritis, unspecified: Secondary | ICD-10-CM | POA: Diagnosis not present

## 2022-05-30 DIAGNOSIS — Z79899 Other long term (current) drug therapy: Secondary | ICD-10-CM | POA: Diagnosis not present

## 2022-05-30 DIAGNOSIS — H0260 Xanthelasma of unspecified eye, unspecified eyelid: Secondary | ICD-10-CM | POA: Diagnosis not present

## 2022-06-10 ENCOUNTER — Other Ambulatory Visit: Payer: Self-pay | Admitting: Cardiology

## 2022-06-22 ENCOUNTER — Encounter (INDEPENDENT_AMBULATORY_CARE_PROVIDER_SITE_OTHER): Payer: Self-pay | Admitting: *Deleted

## 2022-06-29 DIAGNOSIS — I10 Essential (primary) hypertension: Secondary | ICD-10-CM | POA: Diagnosis not present

## 2022-06-29 DIAGNOSIS — I509 Heart failure, unspecified: Secondary | ICD-10-CM | POA: Diagnosis not present

## 2022-06-29 DIAGNOSIS — H0260 Xanthelasma of unspecified eye, unspecified eyelid: Secondary | ICD-10-CM | POA: Diagnosis not present

## 2022-06-29 DIAGNOSIS — Z299 Encounter for prophylactic measures, unspecified: Secondary | ICD-10-CM | POA: Diagnosis not present

## 2022-06-29 DIAGNOSIS — Z79899 Other long term (current) drug therapy: Secondary | ICD-10-CM | POA: Diagnosis not present

## 2022-06-29 DIAGNOSIS — M549 Dorsalgia, unspecified: Secondary | ICD-10-CM | POA: Diagnosis not present

## 2022-07-08 ENCOUNTER — Other Ambulatory Visit: Payer: Self-pay | Admitting: Cardiology

## 2022-07-27 DIAGNOSIS — M069 Rheumatoid arthritis, unspecified: Secondary | ICD-10-CM | POA: Diagnosis not present

## 2022-07-27 DIAGNOSIS — M549 Dorsalgia, unspecified: Secondary | ICD-10-CM | POA: Diagnosis not present

## 2022-07-27 DIAGNOSIS — H0260 Xanthelasma of unspecified eye, unspecified eyelid: Secondary | ICD-10-CM | POA: Diagnosis not present

## 2022-07-27 DIAGNOSIS — Z299 Encounter for prophylactic measures, unspecified: Secondary | ICD-10-CM | POA: Diagnosis not present

## 2022-07-27 DIAGNOSIS — I10 Essential (primary) hypertension: Secondary | ICD-10-CM | POA: Diagnosis not present

## 2022-08-15 ENCOUNTER — Ambulatory Visit: Payer: 59 | Admitting: Internal Medicine

## 2022-08-15 NOTE — Progress Notes (Deleted)
Roy [name], male    DOB: [birth year]    MRN: [id number]   Brief patient profile:  89   yowm with RA on mtx x 2020  quit smoking 2007 with 1st heart attack then cabg  07/2018  referred to pulmonary clinic in [place]  05/12/2022 by Dr Harl Bowie  for unexplained sob onset was spring 2023 per pt but per EMR reported in fall of 2020  (? Correlation with dx RA/ use of MTX)  at wt 178   History of Present Illness  05/12/2022  Pulmonary/ 1st office eval/ Nieko Clarin / [place] [facility] on mtx @ at wt 196  Chief Complaint  Patient presents with   Consult    SOB w activity    Dyspnea:  walking uphill with trash can/ weed eating x 6 months Cough: none  Sleep: flat bed  2 pillows  SABA use: not helping  02: none  Rec To get the most out of exercise, you need to be continuously aware that you are short of breath, but never out of breath, building up to 30 minutes daily.  Make sure you check your oxygen saturations at highest level of activity  Please schedule a follow up visit in 3 months but call sooner if needed with pfts on return    08/15/2022  f/u ov/[place] office/Kraven Calk re: *** maint on ***  No chief complaint on file.   Dyspnea:  *** Cough: *** Sleeping: *** SABA use: *** 02: *** Covid status: *** Lung cancer screening: ***   No obvious day to day or daytime variability or assoc excess/ purulent sputum or mucus plugs or hemoptysis or cp or chest tightness, subjective wheeze or overt sinus or hb symptoms.   *** without nocturnal  or early am exacerbation  of respiratory  c/o's or need for noct saba. Also denies any obvious fluctuation of symptoms with weather or environmental changes or other aggravating or alleviating factors except as outlined above   No unusual exposure hx or h/o childhood pna/ asthma or knowledge of premature birth.  Current Allergies, Complete Past Medical History, Past Surgical History, Family History, and Social History were reviewed in [employer] record.  ROS  The following are not active complaints unless bolded Hoarseness, sore throat, dysphagia, dental problems, itching, sneezing,  nasal congestion or discharge of excess mucus or purulent secretions, ear ache,   fever, chills, sweats, unintended wt loss or wt gain, classically pleuritic or exertional cp,  orthopnea pnd or arm/hand swelling  or leg swelling, presyncope, palpitations, abdominal pain, anorexia, nausea, vomiting, diarrhea  or change in bowel habits or change in bladder habits, change in stools or change in urine, dysuria, hematuria,  rash, arthralgias, visual complaints, headache, numbness, weakness or ataxia or problems with walking or coordination,  change in mood or  memory.        No outpatient medications have been marked as taking for the 08/15/22 encounter (Appointment) with Tanda Rockers, MD.                     Past Medical History:  Diagnosis Date   Abdominal pain, right upper quadrant    Arthritis    "all my joints" (11/28/2013)   Blind left eye    CAD (coronary artery disease)    a. Prior BMS to OM and RCA, subsequent angioplasty due to ISR in these distributions, also BMS placement with DES to LAD. b. Cath 01/2013: diffuse 3V CAD without clear culprit  lesion, normal LVEF, mid LAD lesion noted, treat medically. c. Cath 11/28/2013 DES to mid LAD lesion with abnormal FFR, DES to distal LAD lesion, DES to prox RCA d. 07/24/2018:CABG x4 with LIMA-LAD, SVG-PDA, and Seq-SVG-OM-RI.   Chronic lower back pain    COPD (chronic obstructive pulmonary disease) (HCC)    DDD (degenerative disc disease)    Depression    GERD (gastroesophageal reflux disease)    Hepatitis C C   "had the tx and I'm free of it"   History of blood transfusion    "when I was taking tx for hepatitis"   History of kidney stones    Hyperlipidemia    Hypertension    Hypothyroidism    Kidney stones    "no surgeries"   Macular degeneration of both eyes    "already blind  in my left eye"   Myocardial infarction [facility]) ~ 2007    Other and unspecified angina pectoris    Scoliosis    Sinus bradycardia        Objective:     Wt Readings from Last 3 Encounters:  05/12/22 196 lb 6.4 oz (89.1 kg)  03/28/22 187 lb 12.8 oz (85.2 kg)  09/22/21 187 lb 12.8 oz (85.2 kg)      Vital signs reviewed  08/15/2022  - Note at rest 02 sats  ***% on ***   General appearance:    ***     Assessment

## 2022-08-19 DIAGNOSIS — I700 Atherosclerosis of aorta: Secondary | ICD-10-CM | POA: Diagnosis not present

## 2022-08-19 DIAGNOSIS — Z299 Encounter for prophylactic measures, unspecified: Secondary | ICD-10-CM | POA: Diagnosis not present

## 2022-08-19 DIAGNOSIS — I10 Essential (primary) hypertension: Secondary | ICD-10-CM | POA: Diagnosis not present

## 2022-08-19 DIAGNOSIS — M549 Dorsalgia, unspecified: Secondary | ICD-10-CM | POA: Diagnosis not present

## 2022-08-19 DIAGNOSIS — I25119 Atherosclerotic heart disease of native coronary artery with unspecified angina pectoris: Secondary | ICD-10-CM | POA: Diagnosis not present

## 2022-08-31 DIAGNOSIS — Z299 Encounter for prophylactic measures, unspecified: Secondary | ICD-10-CM | POA: Diagnosis not present

## 2022-08-31 DIAGNOSIS — J449 Chronic obstructive pulmonary disease, unspecified: Secondary | ICD-10-CM | POA: Diagnosis not present

## 2022-08-31 DIAGNOSIS — M25511 Pain in right shoulder: Secondary | ICD-10-CM | POA: Diagnosis not present

## 2022-08-31 DIAGNOSIS — I10 Essential (primary) hypertension: Secondary | ICD-10-CM | POA: Diagnosis not present

## 2022-08-31 DIAGNOSIS — M542 Cervicalgia: Secondary | ICD-10-CM | POA: Diagnosis not present

## 2022-09-01 DIAGNOSIS — L728 Other follicular cysts of the skin and subcutaneous tissue: Secondary | ICD-10-CM | POA: Diagnosis not present

## 2022-09-16 DIAGNOSIS — I10 Essential (primary) hypertension: Secondary | ICD-10-CM | POA: Diagnosis not present

## 2022-09-16 DIAGNOSIS — M069 Rheumatoid arthritis, unspecified: Secondary | ICD-10-CM | POA: Diagnosis not present

## 2022-09-16 DIAGNOSIS — Z299 Encounter for prophylactic measures, unspecified: Secondary | ICD-10-CM | POA: Diagnosis not present

## 2022-09-16 DIAGNOSIS — M549 Dorsalgia, unspecified: Secondary | ICD-10-CM | POA: Diagnosis not present

## 2022-09-26 NOTE — Progress Notes (Deleted)
Roy [name], male    DOB: 1956/01/11    MRN: [id number]   Brief patient profile:  60   yowm with RA on mtx x 2020  quit smoking 2007 with 1st heart attack then cabg  07/2018  referred to pulmonary clinic in [place]  05/12/2022 by Dr Harl Bowie  for unexplained sob onset was spring 2023 per pt but per EMR reported in fall of 2020  (? Correlation with dx RA/ use of MTX)  at wt 178   History of Present Illness  05/12/2022  Pulmonary/ 1st office eval/ [name] / [place] [facility] on mtx @ at wt 196  Chief Complaint  Patient presents with   Consult    SOB w activity    Dyspnea:  walking uphill with trash can/ weed eating x 6 months Cough: none  Sleep: flat bed  2 pillows  SABA use: not helping  02: none  Rec To get the most out of exercise, you need to be continuously aware that you are short of breath, but never out of breath, building up to 30 minutes daily.   Make sure you check your oxygen saturations at highest level of activity      Please schedule a follow up visit in 3 months but call sooner if needed with pfts on return    09/27/2022  f/u ov/[place] office/[name] re: *** maint on ***  No chief complaint on file.   Dyspnea:  *** Cough: *** Sleeping: *** SABA use: *** 02: *** Covid status: *** Lung cancer screening: ***   No obvious day to day or daytime variability or assoc excess/ purulent sputum or mucus plugs or hemoptysis or cp or chest tightness, subjective wheeze or overt sinus or hb symptoms.   *** without nocturnal  or early am exacerbation  of respiratory  c/o's or need for noct saba. Also denies any obvious fluctuation of symptoms with weather or environmental changes or other aggravating or alleviating factors except as outlined above   No unusual exposure hx or h/o childhood pna/ asthma or knowledge of premature birth.  Current Allergies, Complete Past Medical History, Past Surgical History, Family History, and Social History were reviewed in [employer] record.  ROS  The following are not active complaints unless bolded Hoarseness, sore throat, dysphagia, dental problems, itching, sneezing,  nasal congestion or discharge of excess mucus or purulent secretions, ear ache,   fever, chills, sweats, unintended wt loss or wt gain, classically pleuritic or exertional cp,  orthopnea pnd or arm/hand swelling  or leg swelling, presyncope, palpitations, abdominal pain, anorexia, nausea, vomiting, diarrhea  or change in bowel habits or change in bladder habits, change in stools or change in urine, dysuria, hematuria,  rash, arthralgias, visual complaints, headache, numbness, weakness or ataxia or problems with walking or coordination,  change in mood or  memory.        No outpatient medications have been marked as taking for the 09/27/22 encounter (Appointment) with Tanda Rockers, MD.                Past Medical History:  Diagnosis Date   Abdominal pain, right upper quadrant    Arthritis    "all my joints" (11/28/2013)   Blind left eye    CAD (coronary artery disease)    a. Prior BMS to OM and RCA, subsequent angioplasty due to ISR in these distributions, also BMS placement with DES to LAD. b. Cath 01/2013: diffuse 3V CAD without clear culprit  lesion, normal LVEF, mid LAD lesion noted, treat medically. c. Cath 11/28/2013 DES to mid LAD lesion with abnormal FFR, DES to distal LAD lesion, DES to prox RCA d. 07/24/2018:CABG x4 with LIMA-LAD, SVG-PDA, and Seq-SVG-OM-RI.   Chronic lower back pain    COPD (chronic obstructive pulmonary disease) (HCC)    DDD (degenerative disc disease)    Depression    GERD (gastroesophageal reflux disease)    Hepatitis C C   "had the tx and I'm free of it"   History of blood transfusion    "when I was taking tx for hepatitis"   History of kidney stones    Hyperlipidemia    Hypertension    Hypothyroidism    Kidney stones    "no surgeries"   Macular degeneration of both eyes    "already blind in  my left eye"   Myocardial infarction [facility]) ~ 2007    Other and unspecified angina pectoris    Scoliosis    Sinus bradycardia         Objective:    Wt Readings from Last 3 Encounters:  05/12/22 196 lb 6.4 oz (89.1 kg)  03/28/22 187 lb 12.8 oz (85.2 kg)  09/22/21 187 lb 12.8 oz (85.2 kg)      Vital signs reviewed  09/27/2022  - Note at rest 02 sats  ***% on ***   General appearance:    ***                Assessment

## 2022-09-27 ENCOUNTER — Ambulatory Visit: Payer: 59 | Admitting: Internal Medicine

## 2022-10-27 DIAGNOSIS — I10 Essential (primary) hypertension: Secondary | ICD-10-CM | POA: Diagnosis not present

## 2022-10-27 DIAGNOSIS — Z299 Encounter for prophylactic measures, unspecified: Secondary | ICD-10-CM | POA: Diagnosis not present

## 2022-10-27 DIAGNOSIS — I509 Heart failure, unspecified: Secondary | ICD-10-CM | POA: Diagnosis not present

## 2022-10-27 DIAGNOSIS — Z0000 Encounter for general adult medical examination without abnormal findings: Secondary | ICD-10-CM | POA: Diagnosis not present

## 2022-10-27 DIAGNOSIS — M069 Rheumatoid arthritis, unspecified: Secondary | ICD-10-CM | POA: Diagnosis not present

## 2022-10-27 DIAGNOSIS — I700 Atherosclerosis of aorta: Secondary | ICD-10-CM | POA: Diagnosis not present

## 2022-10-27 DIAGNOSIS — I25119 Atherosclerotic heart disease of native coronary artery with unspecified angina pectoris: Secondary | ICD-10-CM | POA: Diagnosis not present

## 2022-11-25 DIAGNOSIS — Z299 Encounter for prophylactic measures, unspecified: Secondary | ICD-10-CM | POA: Diagnosis not present

## 2022-11-25 DIAGNOSIS — I10 Essential (primary) hypertension: Secondary | ICD-10-CM | POA: Diagnosis not present

## 2022-11-25 DIAGNOSIS — M549 Dorsalgia, unspecified: Secondary | ICD-10-CM | POA: Diagnosis not present

## 2022-11-29 ENCOUNTER — Other Ambulatory Visit: Payer: Self-pay | Admitting: Cardiology

## 2022-11-30 ENCOUNTER — Other Ambulatory Visit: Payer: Self-pay | Admitting: Cardiology

## 2022-12-21 ENCOUNTER — Other Ambulatory Visit: Payer: Self-pay | Admitting: Cardiology

## 2022-12-23 DIAGNOSIS — J449 Chronic obstructive pulmonary disease, unspecified: Secondary | ICD-10-CM | POA: Diagnosis not present

## 2022-12-23 DIAGNOSIS — I509 Heart failure, unspecified: Secondary | ICD-10-CM | POA: Diagnosis not present

## 2022-12-23 DIAGNOSIS — Z0000 Encounter for general adult medical examination without abnormal findings: Secondary | ICD-10-CM | POA: Diagnosis not present

## 2022-12-23 DIAGNOSIS — I25119 Atherosclerotic heart disease of native coronary artery with unspecified angina pectoris: Secondary | ICD-10-CM | POA: Diagnosis not present

## 2022-12-23 DIAGNOSIS — Z7189 Other specified counseling: Secondary | ICD-10-CM | POA: Diagnosis not present

## 2022-12-23 DIAGNOSIS — M542 Cervicalgia: Secondary | ICD-10-CM | POA: Diagnosis not present

## 2022-12-23 DIAGNOSIS — Z299 Encounter for prophylactic measures, unspecified: Secondary | ICD-10-CM | POA: Diagnosis not present

## 2022-12-26 DIAGNOSIS — I509 Heart failure, unspecified: Secondary | ICD-10-CM | POA: Diagnosis not present

## 2022-12-30 DIAGNOSIS — Z299 Encounter for prophylactic measures, unspecified: Secondary | ICD-10-CM | POA: Diagnosis not present

## 2022-12-30 DIAGNOSIS — M25549 Pain in joints of unspecified hand: Secondary | ICD-10-CM | POA: Diagnosis not present

## 2022-12-30 DIAGNOSIS — M069 Rheumatoid arthritis, unspecified: Secondary | ICD-10-CM | POA: Diagnosis not present

## 2022-12-30 DIAGNOSIS — D849 Immunodeficiency, unspecified: Secondary | ICD-10-CM | POA: Diagnosis not present

## 2022-12-30 DIAGNOSIS — I10 Essential (primary) hypertension: Secondary | ICD-10-CM | POA: Diagnosis not present

## 2023-01-23 DIAGNOSIS — M549 Dorsalgia, unspecified: Secondary | ICD-10-CM | POA: Diagnosis not present

## 2023-01-23 DIAGNOSIS — I509 Heart failure, unspecified: Secondary | ICD-10-CM | POA: Diagnosis not present

## 2023-01-23 DIAGNOSIS — Z299 Encounter for prophylactic measures, unspecified: Secondary | ICD-10-CM | POA: Diagnosis not present

## 2023-01-23 DIAGNOSIS — I10 Essential (primary) hypertension: Secondary | ICD-10-CM | POA: Diagnosis not present

## 2023-02-08 ENCOUNTER — Encounter: Payer: Self-pay | Admitting: Cardiology

## 2023-02-08 ENCOUNTER — Ambulatory Visit: Payer: 59 | Attending: Cardiology | Admitting: Cardiology

## 2023-02-08 VITALS — BP 110/72 | HR 70 | Ht 69.0 in | Wt 194.2 lb

## 2023-02-08 DIAGNOSIS — I10 Essential (primary) hypertension: Secondary | ICD-10-CM | POA: Diagnosis not present

## 2023-02-08 DIAGNOSIS — E782 Mixed hyperlipidemia: Secondary | ICD-10-CM

## 2023-02-08 DIAGNOSIS — I2510 Atherosclerotic heart disease of native coronary artery without angina pectoris: Secondary | ICD-10-CM | POA: Diagnosis not present

## 2023-02-08 MED ORDER — ROSUVASTATIN CALCIUM 10 MG PO TABS: 10.0000 mg | ORAL_TABLET | Freq: Every day | ORAL | 6 refills | Status: AC

## 2023-02-08 NOTE — Patient Instructions (Addendum)
Medication Instructions:   Increase Crestor to '10mg'$  daily.   Continue all other medications.    Labwork: none  Testing/Procedures: none  Follow-Up: 6 months   Any Other Special Instructions Will Be Listed Below (If Applicable).  If you need a refill on your cardiac medications before your next appointment, please call your pharmacy.

## 2023-02-08 NOTE — Progress Notes (Signed)
Clinical Summary Roy [name] is a 67 y.o.male seen today for follow up of the following medical problems.      1. CAD - CABG Jan 2020 LIMA-LAD, SVG-PDA, and Seq-SVG-OM-RI. Jan 2021 nuclear stress: Small, mild to moderate intensity, partially reversible apical to basal inferior defect. Partial reversibility most evident in mid to basal segment consistent with ischemic territory.   - imdur 30 caused orthostatic symptoms in the past, tolerating 15mg  daily.           03/2022 echo: LVEF 60-65%, no WMAs, grade I dd - denies any chest pains. Breathing "doing ok" - compliant with meds   2. Syncope - episode occurred 3 week ago. [place] father's [place]. Was standing for a period, felt like was going to pass out. Denies dehydration history. No palpitations - often with orthostatic symptoms.  - dr pepper x 5 cans, milk x 1 glass.    - no recent episodes     3. HTN - compliant with meds   4. 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 Prior BMS to OM and RCA, subsequent angioplasty due to ISR in these distributions, also BMS placement with DES to LAD. b. Cath 01/2013: diffuse 3V CAD without clear culprit lesion, normal LVEF, mid LAD lesion noted, treat medically. c. Cath 11/28/2013 DES to mid LAD lesion with abnormal FFR, DES to distal LAD lesion, DES to prox RCA d. 07/24/2018:CABG x4 with LIMA-LAD, SVG-PDA, and Seq-SVG-OM-RI.   Chronic lower back pain    COPD (chronic obstructive pulmonary disease) (HCC)    DDD (degenerative disc disease)    Depression    GERD (gastroesophageal reflux disease)    Hepatitis C C   "had the tx and I'm free of it"   History of blood transfusion    "when I was taking tx for hepatitis"    History of kidney stones    Hyperlipidemia    Hypertension    Hypothyroidism    Kidney stones    "no surgeries"   Macular degeneration of both eyes    "already blind in my left eye"   Myocardial infarction [facility]) ~ 2007    Other and unspecified angina pectoris    Scoliosis    Sinus bradycardia      Allergies  Allergen Reactions   Penicillins Other (See Comments)    Chest pain PATIENT HAS HAD A PCN REACTION WITH IMMEDIATE RASH, FACIAL/TONGUE/THROAT SWELLING, SOB, OR LIGHTHEADEDNESS WITH HYPOTENSION:  #  #  YES  #  #  Has patient had a PCN reaction causing severe rash involving mucus membranes or skin necrosis: no Has patient had a PCN reaction that required hospitalization: no Has patient had a PCN reaction occurring within the last 10 years: no If all of the above answers are "NO", then may proceed with Cephalosporin use.    Imdur [Isosorbide Nitrate] Other (See Comments)    Has not tolerated well in the past UNSPECIFIED REACTION    Codeine Itching   Food Rash and Other (See Comments)    PEACHES     Current Outpatient Medications  Medication Sig Dispense Refill   albuterol (VENTOLIN  HFA) 108 (90 Base) MCG/ACT inhaler Inhale 2 puffs into the lungs every 6 (six) hours as needed.     aspirin EC 81 MG tablet Take 1 tablet (81 mg total) by mouth daily. Swallow whole. 90 tablet 1   carvedilol (COREG) 6.25 MG tablet Take 3.125 mg by mouth 2 (two) times daily with a meal.     folic acid (FOLVITE) 1 MG tablet Take 1 mg by mouth 2 (two) times daily.     furosemide (LASIX) 20 MG tablet take 1 tablet once a day as needed for swelling. 30 tablet 1   gabapentin (NEURONTIN) 300 MG capsule Take 600 mg by mouth 3 (three) times daily.      isosorbide mononitrate (IMDUR) 30 MG 24 hr tablet TAKE (1/2) TABLET BY MOUTH DAILY. 15 tablet 1   levothyroxine (SYNTHROID, LEVOTHROID) 75 MCG tablet Take 75 mcg by mouth daily before breakfast.     methotrexate 2.5 MG tablet Take 25 mg by mouth once a  week. Caution:Chemotherapy. Protect from light.     nitroGLYCERIN (NITROSTAT) 0.4 MG SL tablet Place 1 tablet (0.4 mg total) under the tongue every 5 (five) minutes x 3 doses as needed. (Patient taking differently: Place 0.4 mg under the tongue every 5 (five) minutes x 3 doses as needed for chest pain.) 25 tablet 3   oxyCODONE (OXY IR/ROXICODONE) 5 MG immediate release tablet Take 5 mg by mouth 3 (three) times daily as needed.     pantoprazole (PROTONIX) 40 MG tablet Take 40 mg by mouth daily.     potassium chloride (KLOR-CON M) 10 MEQ tablet Take 10 mEq by mouth 2 (two) times daily.     rosuvastatin (CRESTOR) 5 MG tablet Take 5 mg by mouth daily.     sucralfate (CARAFATE) 1 g tablet Take 1 g by mouth daily.     tamsulosin (FLOMAX) 0.4 MG CAPS capsule Take 0.4 mg by mouth daily.     traZODone (DESYREL) 100 MG tablet Take 100 mg by mouth at bedtime.     No current facility-administered medications for this visit.     Past Surgical History:  Procedure Laterality Date   BACK SURGERY     x3 for scoliosis   CARDIAC CATHETERIZATION     "I've had a few without intervention"   COLONOSCOPY     COLONOSCOPY N/A 05/02/2013   Procedure: COLONOSCOPY;  Surgeon: Malissa Hippo, MD;  Location: [facility];  Service: Endoscopy;  Laterality: N/A;  1030-moved to 1055 Ann to notify pt   COLONOSCOPY N/A 07/06/2017   Procedure: COLONOSCOPY;  Surgeon: Malissa Hippo, MD;  Location: [facility];  Service: Endoscopy;  Laterality: N/A;  12:45   CORONARY ANGIOPLASTY WITH STENT PLACEMENT   2007; 2008; 11/28/2013   "2 + 2; + 3"   CORONARY ARTERY BYPASS GRAFT N/A 07/24/2018   Procedure: CORONARY ARTERY BYPASS GRAFTING (CABG);  Surgeon: Alleen Borne, MD;  Location: [facility] OR;  Service: Open Heart Surgery;  Laterality: N/A;  Times 4 using left internal mammary artery and endoscopically harvested right saphenous vein   INGUINAL HERNIA REPAIR Bilateral ~ 2000   LEFT HEART CATH AND CORONARY ANGIOGRAPHY N/A 07/12/2018    Procedure: LEFT HEART CATH AND CORONARY ANGIOGRAPHY;  Surgeon: Corky Crafts, MD;  Location: [facility] INVASIVE CV LAB;  Service: Cardiovascular;  Laterality: N/A;   LEFT HEART CATHETERIZATION WITH CORONARY ANGIOGRAM N/A 01/30/2013   Procedure: LEFT HEART CATHETERIZATION WITH CORONARY ANGIOGRAM;  Surgeon: Dolores Patty, MD;  Location:  [facility] CATH LAB;  Service: Cardiovascular;  Laterality: N/A;   LEFT HEART CATHETERIZATION WITH CORONARY ANGIOGRAM N/A 11/28/2013   Procedure: LEFT HEART CATHETERIZATION WITH CORONARY ANGIOGRAM;  Surgeon: Corky Crafts, MD;  Location: [facility] CATH LAB;  Service: Cardiovascular;  Laterality: N/A;   LIVER BIOPSY  03/24/2010   POLYPECTOMY  07/06/2017   Procedure: POLYPECTOMY;  Surgeon: Malissa Hippo, MD;  Location: [facility];  Service: Endoscopy;;  colon   TEE WITHOUT CARDIOVERSION N/A 07/24/2018   Procedure: TRANSESOPHAGEAL ECHOCARDIOGRAM (TEE);  Surgeon: Alleen Borne, MD;  Location: [facility] OR;  Service: Open Heart Surgery;  Laterality: N/A;   TOOTH EXTRACTION       Allergies  Allergen Reactions   Penicillins Other (See Comments)    Chest pain PATIENT HAS HAD A PCN REACTION WITH IMMEDIATE RASH, FACIAL/TONGUE/THROAT SWELLING, SOB, OR LIGHTHEADEDNESS WITH HYPOTENSION:  #  #  YES  #  #  Has patient had a PCN reaction causing severe rash involving mucus membranes or skin necrosis: no Has patient had a PCN reaction that required hospitalization: no Has patient had a PCN reaction occurring within the last 10 years: no If all of the above answers are "NO", then may proceed with Cephalosporin use.    Imdur [Isosorbide Nitrate] Other (See Comments)    Has not tolerated well in the past UNSPECIFIED REACTION    Codeine Itching   Food Rash and Other (See Comments)    PEACHES      Family History  Problem Relation Age of Onset   Diabetes Mother    Diabetes Brother    Heart disease Brother    Diabetes Brother    Healthy Brother    Healthy Son    Healthy Son       Social History Roy [name] reports that he quit smoking about 16 years ago. His smoking use included cigarettes. He started smoking about 51 years ago. He has a 52.5 pack-year smoking history. He has never used smokeless tobacco. Roy [name] reports no history of alcohol use.   Review of Systems CONSTITUTIONAL: No weight loss, fever, chills, weakness or fatigue.  HEENT: Eyes: No visual loss, blurred vision, double vision or yellow sclerae.No hearing loss, sneezing, congestion, runny nose or sore throat.  SKIN: No rash or itching.  CARDIOVASCULAR: per hpi RESPIRATORY: No shortness of breath, cough or sputum.  GASTROINTESTINAL: No anorexia, nausea, vomiting or diarrhea. No abdominal pain or blood.  GENITOURINARY: No burning on urination, no polyuria NEUROLOGICAL: No headache, dizziness, syncope, paralysis, ataxia, numbness or tingling in the extremities. No change in bowel or bladder control.  MUSCULOSKELETAL: No muscle, back pain, joint pain or stiffness.  LYMPHATICS: No enlarged nodes. No history of splenectomy.  PSYCHIATRIC: No history of depression or anxiety.  ENDOCRINOLOGIC: No reports of sweating, cold or heat intolerance. No polyuria or polydipsia.  [name]   Physical Examination Today's Vitals   02/08/23 1014  BP: 110/72  Pulse: 70  SpO2: 97%  Weight: 194 lb 3.2 oz (88.1 kg)  Height: 5\' 9"  (1.753 m)   Body mass index is 28.68 kg/m.  Gen: resting comfortably, no acute distress HEENT: no scleral icterus, pupils equal round and reactive, no palptable cervical adenopathy,  CV: RRR, no mrg, no jvd Resp: Clear to auscultation bilaterally GI: abdomen is soft, non-tender, non-distended, normal bowel sounds, no hepatosplenomegaly MSK: extremities are warm, no edema.  Skin: warm, no rash Neuro:  no focal deficits Psych: appropriate affect   Diagnostic Studies  Cardiac Catheterization: 07/12/2018 Previously  placed Prox RCA to Mid RCA stent (unknown type) is widely  patent. Mid LM to Dist LM lesion is 40% stenosed. Mid RCA lesion is 10% stenosed. Ost Ramus to Ramus lesion is 95% stenosed. Ost 1st Mrg to 1st Mrg lesion is 95% stenosed. Ost LAD lesion is 50% stenosed. Previously placed Prox LAD stent (unknown type) is widely patent. Ost 1st Diag to 1st Diag lesion is 99% stenosed. Ost 2nd Diag to 2nd Diag lesion is 70% stenosed. Mid LAD-1 lesion is 75% stenosed. Previously placed Mid LAD-2 stent (unknown type) is widely patent. Dist RCA lesion is 25% stenosed. Dist Cx lesion is 90% stenosed. The left ventricular systolic function is normal. LV end diastolic pressure is normal. The left ventricular ejection fraction is 55-65% by visual estimate. There is no aortic valve stenosis.   Severe multivessel CAD.  Multiple es involved and moderate distal left main and ostial LAD disease as well.  Severe mid LAD disease.  CTO of large diagonal .    When compared to the 2015 cath, there has been significant progression of the mid LAD disease,  vessel disease and mild progression of the distal left main disease.   Plan for Cardiac surgery consult for CABG.     TEE: 07/24/2018 Aortic valve: The valve is trileaflet. No stenosis. No regurgitation. Right ventricle: Normal cavity size, wall thickness and ejection fraction. Mitral valve: Trace regurgitation     03/2022 echo 1. Left ventricular ejection fraction, by estimation, is 60 to 65%. The  left ventricle has normal function. The left ventricle has no regional  wall motion abnormalities. Left ventricular diastolic parameters are  consistent with Grade I diastolic  dysfunction (impaired relaxation). The average left ventricular global  longitudinal strain is -18.2 %. The global longitudinal strain is normal.   2. Right ventricular systolic function is normal. The right ventricular  size is normal.   3. The mitral valve is normal in structure. Trivial mitral valve  regurgitation. No  evidence of mitral stenosis.   4. The tricuspid valve is abnormal.   5. The aortic valve is tricuspid. Aortic valve regurgitation is not  visualized. No aortic stenosis is present.   6. The inferior vena cava is normal in size with greater than 50%  respiratory variability, suggesting right atrial pressure of 3 mmHg.   Assessment and Plan   1. CAD - no recent symptoms, continue current meds   2. HTN - bp is at goal, continue current meds   3. Hyperlipidemia -LDL above goal, increase crestor to 10mg  daily.    F/u 6 months     Antoine Poche, M.D.

## 2023-02-14 ENCOUNTER — Other Ambulatory Visit: Payer: Self-pay | Admitting: Cardiology

## 2023-02-24 DIAGNOSIS — M549 Dorsalgia, unspecified: Secondary | ICD-10-CM | POA: Diagnosis not present

## 2023-02-24 DIAGNOSIS — I10 Essential (primary) hypertension: Secondary | ICD-10-CM | POA: Diagnosis not present

## 2023-02-24 DIAGNOSIS — M542 Cervicalgia: Secondary | ICD-10-CM | POA: Diagnosis not present

## 2023-02-24 DIAGNOSIS — Z299 Encounter for prophylactic measures, unspecified: Secondary | ICD-10-CM | POA: Diagnosis not present

## 2023-03-28 DIAGNOSIS — Z299 Encounter for prophylactic measures, unspecified: Secondary | ICD-10-CM | POA: Diagnosis not present

## 2023-03-28 DIAGNOSIS — Z79899 Other long term (current) drug therapy: Secondary | ICD-10-CM | POA: Diagnosis not present

## 2023-03-28 DIAGNOSIS — I10 Essential (primary) hypertension: Secondary | ICD-10-CM | POA: Diagnosis not present

## 2023-03-28 DIAGNOSIS — I209 Angina pectoris, unspecified: Secondary | ICD-10-CM | POA: Diagnosis not present

## 2023-03-28 DIAGNOSIS — I700 Atherosclerosis of aorta: Secondary | ICD-10-CM | POA: Diagnosis not present

## 2023-03-28 DIAGNOSIS — R5383 Other fatigue: Secondary | ICD-10-CM | POA: Diagnosis not present

## 2023-03-28 DIAGNOSIS — M069 Rheumatoid arthritis, unspecified: Secondary | ICD-10-CM | POA: Diagnosis not present

## 2023-04-13 DIAGNOSIS — M79641 Pain in right hand: Secondary | ICD-10-CM | POA: Diagnosis not present

## 2023-04-13 DIAGNOSIS — M069 Rheumatoid arthritis, unspecified: Secondary | ICD-10-CM | POA: Diagnosis not present

## 2023-04-13 DIAGNOSIS — M79642 Pain in left hand: Secondary | ICD-10-CM | POA: Diagnosis not present

## 2023-04-13 DIAGNOSIS — I10 Essential (primary) hypertension: Secondary | ICD-10-CM | POA: Diagnosis not present

## 2023-04-13 DIAGNOSIS — Z299 Encounter for prophylactic measures, unspecified: Secondary | ICD-10-CM | POA: Diagnosis not present

## 2023-04-27 DIAGNOSIS — Z23 Encounter for immunization: Secondary | ICD-10-CM | POA: Diagnosis not present

## 2023-04-27 DIAGNOSIS — I509 Heart failure, unspecified: Secondary | ICD-10-CM | POA: Diagnosis not present

## 2023-04-27 DIAGNOSIS — J449 Chronic obstructive pulmonary disease, unspecified: Secondary | ICD-10-CM | POA: Diagnosis not present

## 2023-04-27 DIAGNOSIS — Z299 Encounter for prophylactic measures, unspecified: Secondary | ICD-10-CM | POA: Diagnosis not present

## 2023-04-27 DIAGNOSIS — M549 Dorsalgia, unspecified: Secondary | ICD-10-CM | POA: Diagnosis not present

## 2023-04-27 DIAGNOSIS — I10 Essential (primary) hypertension: Secondary | ICD-10-CM | POA: Diagnosis not present

## 2023-05-26 DIAGNOSIS — M069 Rheumatoid arthritis, unspecified: Secondary | ICD-10-CM | POA: Diagnosis not present

## 2023-05-26 DIAGNOSIS — R52 Pain, unspecified: Secondary | ICD-10-CM | POA: Diagnosis not present

## 2023-05-26 DIAGNOSIS — M549 Dorsalgia, unspecified: Secondary | ICD-10-CM | POA: Diagnosis not present

## 2023-05-26 DIAGNOSIS — I10 Essential (primary) hypertension: Secondary | ICD-10-CM | POA: Diagnosis not present

## 2023-05-26 DIAGNOSIS — Z299 Encounter for prophylactic measures, unspecified: Secondary | ICD-10-CM | POA: Diagnosis not present

## 2023-05-26 DIAGNOSIS — J449 Chronic obstructive pulmonary disease, unspecified: Secondary | ICD-10-CM | POA: Diagnosis not present

## 2023-05-26 DIAGNOSIS — I509 Heart failure, unspecified: Secondary | ICD-10-CM | POA: Diagnosis not present

## 2023-05-29 DIAGNOSIS — Z981 Arthrodesis status: Secondary | ICD-10-CM | POA: Diagnosis not present

## 2023-05-29 DIAGNOSIS — M419 Scoliosis, unspecified: Secondary | ICD-10-CM | POA: Diagnosis not present

## 2023-05-29 DIAGNOSIS — I700 Atherosclerosis of aorta: Secondary | ICD-10-CM | POA: Diagnosis not present

## 2023-05-29 DIAGNOSIS — M545 Low back pain, unspecified: Secondary | ICD-10-CM | POA: Diagnosis not present

## 2023-05-29 DIAGNOSIS — M4186 Other forms of scoliosis, lumbar region: Secondary | ICD-10-CM | POA: Diagnosis not present

## 2023-05-29 DIAGNOSIS — M609 Myositis, unspecified: Secondary | ICD-10-CM | POA: Diagnosis not present

## 2023-06-26 DIAGNOSIS — Z299 Encounter for prophylactic measures, unspecified: Secondary | ICD-10-CM | POA: Diagnosis not present

## 2023-06-26 DIAGNOSIS — M549 Dorsalgia, unspecified: Secondary | ICD-10-CM | POA: Diagnosis not present

## 2023-06-26 DIAGNOSIS — L089 Local infection of the skin and subcutaneous tissue, unspecified: Secondary | ICD-10-CM | POA: Diagnosis not present

## 2023-06-26 DIAGNOSIS — I509 Heart failure, unspecified: Secondary | ICD-10-CM | POA: Diagnosis not present

## 2023-06-26 DIAGNOSIS — M6281 Muscle weakness (generalized): Secondary | ICD-10-CM | POA: Diagnosis not present

## 2023-06-26 DIAGNOSIS — I10 Essential (primary) hypertension: Secondary | ICD-10-CM | POA: Diagnosis not present

## 2023-06-26 DIAGNOSIS — Z79899 Other long term (current) drug therapy: Secondary | ICD-10-CM | POA: Diagnosis not present

## 2023-07-12 DIAGNOSIS — M069 Rheumatoid arthritis, unspecified: Secondary | ICD-10-CM | POA: Diagnosis not present

## 2023-07-12 DIAGNOSIS — D849 Immunodeficiency, unspecified: Secondary | ICD-10-CM | POA: Diagnosis not present

## 2023-07-12 DIAGNOSIS — I10 Essential (primary) hypertension: Secondary | ICD-10-CM | POA: Diagnosis not present

## 2023-07-12 DIAGNOSIS — Z299 Encounter for prophylactic measures, unspecified: Secondary | ICD-10-CM | POA: Diagnosis not present

## 2023-07-12 DIAGNOSIS — M79604 Pain in right leg: Secondary | ICD-10-CM | POA: Diagnosis not present

## 2023-07-25 DIAGNOSIS — J449 Chronic obstructive pulmonary disease, unspecified: Secondary | ICD-10-CM | POA: Diagnosis not present

## 2023-07-25 DIAGNOSIS — I700 Atherosclerosis of aorta: Secondary | ICD-10-CM | POA: Diagnosis not present

## 2023-07-25 DIAGNOSIS — I10 Essential (primary) hypertension: Secondary | ICD-10-CM | POA: Diagnosis not present

## 2023-07-25 DIAGNOSIS — I509 Heart failure, unspecified: Secondary | ICD-10-CM | POA: Diagnosis not present

## 2023-07-25 DIAGNOSIS — M069 Rheumatoid arthritis, unspecified: Secondary | ICD-10-CM | POA: Diagnosis not present

## 2023-07-25 DIAGNOSIS — Z79899 Other long term (current) drug therapy: Secondary | ICD-10-CM | POA: Diagnosis not present

## 2023-07-25 DIAGNOSIS — Z299 Encounter for prophylactic measures, unspecified: Secondary | ICD-10-CM | POA: Diagnosis not present

## 2023-08-22 DIAGNOSIS — I10 Essential (primary) hypertension: Secondary | ICD-10-CM | POA: Diagnosis not present

## 2023-08-22 DIAGNOSIS — M069 Rheumatoid arthritis, unspecified: Secondary | ICD-10-CM | POA: Diagnosis not present

## 2023-08-22 DIAGNOSIS — Z299 Encounter for prophylactic measures, unspecified: Secondary | ICD-10-CM | POA: Diagnosis not present

## 2023-08-22 DIAGNOSIS — R52 Pain, unspecified: Secondary | ICD-10-CM | POA: Diagnosis not present

## 2023-08-22 DIAGNOSIS — Z79899 Other long term (current) drug therapy: Secondary | ICD-10-CM | POA: Diagnosis not present

## 2023-08-22 DIAGNOSIS — I509 Heart failure, unspecified: Secondary | ICD-10-CM | POA: Diagnosis not present

## 2023-08-22 DIAGNOSIS — M549 Dorsalgia, unspecified: Secondary | ICD-10-CM | POA: Diagnosis not present

## 2023-08-22 DIAGNOSIS — R053 Chronic cough: Secondary | ICD-10-CM | POA: Diagnosis not present

## 2023-08-28 ENCOUNTER — Ambulatory Visit: Payer: 59 | Admitting: Cardiology

## 2023-08-28 NOTE — Progress Notes (Deleted)
 Clinical Summary Roy [name] is a 68 y.o.male  seen today for follow up of the following medical problems.      1. CAD - CABG Jan 2020 LIMA-LAD, SVG-PDA, and Seq-SVG-OM-RI. Jan 2021 nuclear stress: Small, mild to moderate intensity, partially reversible apical to basal inferior defect. Partial reversibility most evident in mid to basal segment consistent with ischemic territory.   - imdur 30 caused orthostatic symptoms in the past, tolerating 15mg  daily.           03/2022 echo: LVEF 60-65%, no WMAs, grade I dd - denies any chest pains. Breathing "doing ok" - compliant with meds     2. Syncope - episode occurred 3 week ago. [place] visit father's grave. Was standing for a period, felt like was going to pass out. Denies dehydration history. No palpitations - often with orthostatic symptoms.  - dr pepper x 5 cans, milk x 1 glass.    - no recent episodes     3. HTN - compliant with meds   4. 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 Prior BMS to OM and RCA, subsequent angioplasty due to ISR in these distributions, also BMS placement with DES to LAD. b. Cath 01/2013: diffuse 3V CAD without clear culprit lesion, normal LVEF, mid LAD lesion noted, treat medically. c. Cath 11/28/2013 DES to mid LAD lesion with abnormal FFR, DES to distal LAD lesion, DES to prox RCA d. 07/24/2018:CABG x4 with LIMA-LAD, SVG-PDA, and Seq-SVG-OM-RI.   Chronic lower back pain    COPD (chronic obstructive pulmonary disease) (HCC)    DDD (degenerative disc disease)    Depression    GERD (gastroesophageal reflux disease)    Hepatitis C C   "had the tx and I'm free of it"   History of blood transfusion    "when I was taking tx for  hepatitis"   History of kidney stones    Hyperlipidemia    Hypertension    Hypothyroidism    Kidney stones    "no surgeries"   Macular degeneration of both eyes    "already blind in my left eye"   Myocardial infarction [facility]) ~ 2007    Other and unspecified angina pectoris    Scoliosis    Sinus bradycardia      Allergies  Allergen Reactions   Penicillins Other (See Comments)    Chest pain PATIENT HAS HAD A PCN REACTION WITH IMMEDIATE RASH, FACIAL/TONGUE/THROAT SWELLING, SOB, OR LIGHTHEADEDNESS WITH HYPOTENSION:  #  #  YES  #  #  Has patient had a PCN reaction causing severe rash involving mucus membranes or skin necrosis: no Has patient had a PCN reaction that required hospitalization: no Has patient had a PCN reaction occurring within the last 10 years: no If all of the above answers are "NO", then may proceed with Cephalosporin use.    Imdur [Isosorbide Nitrate] Other (See Comments)    Has not tolerated well in the past UNSPECIFIED REACTION    Codeine Itching   Food Rash and Other (See Comments)    PEACHES     Current Outpatient Medications  Medication Sig Dispense Refill  albuterol (VENTOLIN HFA) 108 (90 Base) MCG/ACT inhaler Inhale 2 puffs into the lungs every 6 (six) hours as needed.     aspirin EC 81 MG tablet Take 1 tablet (81 mg total) by mouth daily. Swallow whole. 90 tablet 1   carvedilol (COREG) 6.25 MG tablet Take 3.125 mg by mouth 2 (two) times daily with a meal.     folic acid (FOLVITE) 1 MG tablet Take 1 mg by mouth 2 (two) times daily.     furosemide (LASIX) 20 MG tablet take 1 tablet once a day as needed for swelling. 90 tablet 2   gabapentin (NEURONTIN) 300 MG capsule Take 600 mg by mouth 3 (three) times daily.      isosorbide mononitrate (IMDUR) 30 MG 24 hr tablet take (1/2) tablet by mouth daily. 45 tablet 2   levothyroxine (SYNTHROID, LEVOTHROID) 75 MCG tablet Take 75 mcg by mouth daily before breakfast.     methotrexate 2.5 MG tablet Take 25 mg by  mouth once a week. Caution:Chemotherapy. Protect from light.     nitroGLYCERIN (NITROSTAT) 0.4 MG SL tablet Place 1 tablet (0.4 mg total) under the tongue every 5 (five) minutes x 3 doses as needed. (Patient taking differently: Place 0.4 mg under the tongue every 5 (five) minutes x 3 doses as needed for chest pain.) 25 tablet 3   oxyCODONE (OXY IR/ROXICODONE) 5 MG immediate release tablet Take 5 mg by mouth 3 (three) times daily as needed.     pantoprazole (PROTONIX) 40 MG tablet Take 40 mg by mouth daily.     potassium chloride (KLOR-CON M) 10 MEQ tablet Take 10 mEq by mouth 2 (two) times daily.     rosuvastatin (CRESTOR) 10 MG tablet Take 1 tablet (10 mg total) by mouth daily. 30 tablet 6   sucralfate (CARAFATE) 1 g tablet Take 1 g by mouth daily.     tamsulosin (FLOMAX) 0.4 MG CAPS capsule Take 0.4 mg by mouth daily.     traZODone (DESYREL) 100 MG tablet Take 100 mg by mouth at bedtime.     No current facility-administered medications for this visit.     Past Surgical History:  Procedure Laterality Date   BACK SURGERY     x3 for scoliosis   CARDIAC CATHETERIZATION     "I've had a few without intervention"   COLONOSCOPY     COLONOSCOPY N/A 05/02/2013   Procedure: COLONOSCOPY;  Surgeon: Malissa Hippo, MD;  [facility];  Service: Endoscopy;  Laterality: N/A;  1030-moved to 1055 Ann to notify pt   COLONOSCOPY N/A 07/06/2017   Procedure: COLONOSCOPY;  Surgeon: Malissa Hippo, MD;  Location: [facility];  Service: Endoscopy;  Laterality: N/A;  12:45   CORONARY ANGIOPLASTY WITH STENT PLACEMENT   2007; 2008; 11/28/2013   "2 + 2; + 3"   CORONARY ARTERY BYPASS GRAFT N/A 07/24/2018   Procedure: CORONARY ARTERY BYPASS GRAFTING (CABG);  Surgeon: Alleen Borne, MD;  Location: [facility] OR;  Service: Open Heart Surgery;  Laterality: N/A;  Times 4 using left internal mammary artery and endoscopically harvested right saphenous vein   INGUINAL HERNIA REPAIR Bilateral ~ 2000   LEFT HEART CATH  AND CORONARY ANGIOGRAPHY N/A 07/12/2018   Procedure: LEFT HEART CATH AND CORONARY ANGIOGRAPHY;  Surgeon: Corky Crafts, MD;  Location: [facility] INVASIVE CV LAB;  Service: Cardiovascular;  Laterality: N/A;   LEFT HEART CATHETERIZATION WITH CORONARY ANGIOGRAM N/A 01/30/2013   Procedure: LEFT HEART CATHETERIZATION WITH CORONARY ANGIOGRAM;  Surgeon:  Dolores Patty, MD;  Location: [facility] CATH LAB;  Service: Cardiovascular;  Laterality: N/A;   LEFT HEART CATHETERIZATION WITH CORONARY ANGIOGRAM N/A 11/28/2013   Procedure: LEFT HEART CATHETERIZATION WITH CORONARY ANGIOGRAM;  Surgeon: Corky Crafts, MD;  Location: [facility] CATH LAB;  Service: Cardiovascular;  Laterality: N/A;   LIVER BIOPSY  03/24/2010   POLYPECTOMY  07/06/2017   Procedure: POLYPECTOMY;  Surgeon: Malissa Hippo, MD;  [facility]: [facility];  Service: Endoscopy;;  colon   TEE WITHOUT CARDIOVERSION N/A 07/24/2018   Procedure: TRANSESOPHAGEAL ECHOCARDIOGRAM (TEE);  Surgeon: Alleen Borne, MD;  Location: [facility] OR;  Service: Open Heart Surgery;  Laterality: N/A;   TOOTH EXTRACTION       Allergies  Allergen Reactions   Penicillins Other (See Comments)    Chest pain PATIENT HAS HAD A PCN REACTION WITH IMMEDIATE RASH, FACIAL/TONGUE/THROAT SWELLING, SOB, OR LIGHTHEADEDNESS WITH HYPOTENSION:  #  #  YES  #  #  Has patient had a PCN reaction causing severe rash involving mucus membranes or skin necrosis: no Has patient had a PCN reaction that required hospitalization: no Has patient had a PCN reaction occurring within the last 10 years: no If all of the above answers are "NO", then may proceed with Cephalosporin use.    Imdur [Isosorbide Nitrate] Other (See Comments)    Has not tolerated well in the past UNSPECIFIED REACTION    Codeine Itching   Food Rash and Other (See Comments)    PEACHES      Family History  Problem Relation Age of Onset   Diabetes Mother    Diabetes Brother    Heart disease Brother    Diabetes Brother    Healthy  Brother    Healthy Son    Healthy Son      Social History Roy [name] reports that he quit smoking about 17 years ago. His smoking use included cigarettes. He started smoking about 52 years ago. He has a 52.5 pack-year smoking history. He has never used smokeless tobacco. Roy [name] reports no history of alcohol use.   Review of Systems CONSTITUTIONAL: No weight loss, fever, chills, weakness or fatigue.  HEENT: Eyes: No visual loss, blurred vision, double vision or yellow sclerae.No hearing loss, sneezing, congestion, runny nose or sore throat.  SKIN: No rash or itching.  CARDIOVASCULAR:  RESPIRATORY: No shortness of breath, cough or sputum.  GASTROINTESTINAL: No anorexia, nausea, vomiting or diarrhea. No abdominal pain or blood.  GENITOURINARY: No burning on urination, no polyuria NEUROLOGICAL: No headache, dizziness, syncope, paralysis, ataxia, numbness or tingling in the extremities. No change in bowel or bladder control.  MUSCULOSKELETAL: No muscle, back pain, joint pain or stiffness.  LYMPHATICS: No enlarged nodes. No history of splenectomy.  PSYCHIATRIC: No history of depression or anxiety.  ENDOCRINOLOGIC: No reports of sweating, cold or heat intolerance. No polyuria or polydipsia.  [name]   Physical Examination There were no vitals filed for this visit. There were no vitals filed for this visit.  Gen: resting comfortably, no acute distress HEENT: no scleral icterus, pupils equal round and reactive, no palptable cervical adenopathy,  CV Resp: Clear to auscultation bilaterally GI: abdomen is soft, non-tender, non-distended, normal bowel sounds, no hepatosplenomegaly MSK: extremities are warm, no edema.  Skin: warm, no rash Neuro:  no focal deficits Psych: appropriate affect   Diagnostic Studies  Cardiac Catheterization: 07/12/2018 Previously placed Prox RCA to Mid RCA stent (unknown type) is widely patent. Mid LM to Dist LM lesion is 40% stenosed.  Mid RCA lesion is 10%  stenosed. Ost Ramus to Ramus lesion is 95% stenosed. Ost 1st Mrg to 1st Mrg lesion is 95% stenosed. Ost LAD lesion is 50% stenosed. Previously placed Prox LAD stent (unknown type) is widely patent. Ost 1st Diag to 1st Diag lesion is 99% stenosed. Ost 2nd Diag to 2nd Diag lesion is 70% stenosed. Mid LAD-1 lesion is 75% stenosed. Previously placed Mid LAD-2 stent (unknown type) is widely patent. Dist RCA lesion is 25% stenosed. Dist Cx lesion is 90% stenosed. The left ventricular systolic function is normal. LV end diastolic pressure is normal. The left ventricular ejection fraction is 55-65% by visual estimate. There is no aortic valve stenosis.   Severe multivessel CAD.  Multiple branches involved and moderate distal left main and ostial LAD disease as well.  Severe mid LAD disease.  CTO of large diagonal Samiha Denapoli.    When compared to the 2015 cath, there has been significant progression of the mid LAD disease, Jakaylah Schlafer vessel disease and mild progression of the distal left main disease.   Plan for Cardiac surgery consult for CABG.     TEE: 07/24/2018 Aortic valve: The valve is trileaflet. No stenosis. No regurgitation. Right ventricle: Normal cavity size, wall thickness and ejection fraction. Mitral valve: Trace regurgitation     03/2022 echo 1. Left ventricular ejection fraction, by estimation, is 60 to 65%. The  left ventricle has normal function. The left ventricle has no regional  wall motion abnormalities. Left ventricular diastolic parameters are  consistent with Grade I diastolic  dysfunction (impaired relaxation). The average left ventricular global  longitudinal strain is -18.2 %. The global longitudinal strain is normal.   2. Right ventricular systolic function is normal. The right ventricular  size is normal.   3. The mitral valve is normal in structure. Trivial mitral valve  regurgitation. No evidence of mitral stenosis.   4. The tricuspid valve is abnormal.   5. The  aortic valve is tricuspid. Aortic valve regurgitation is not  visualized. No aortic stenosis is present.   6. The inferior vena cava is normal in size with greater than 50%  respiratory variability, suggesting right atrial pressure of 3 mmHg.      Assessment and Plan  1. CAD - no recent symptoms, continue current meds   2. HTN - bp is at goal, continue current meds   3. Hyperlipidemia -LDL above goal, increase crestor to 10mg  daily.      Antoine Poche, M.D., F.A.C.C.

## 2023-08-31 DIAGNOSIS — M069 Rheumatoid arthritis, unspecified: Secondary | ICD-10-CM | POA: Diagnosis not present

## 2023-08-31 DIAGNOSIS — Z299 Encounter for prophylactic measures, unspecified: Secondary | ICD-10-CM | POA: Diagnosis not present

## 2023-08-31 DIAGNOSIS — I10 Essential (primary) hypertension: Secondary | ICD-10-CM | POA: Diagnosis not present

## 2023-08-31 DIAGNOSIS — I509 Heart failure, unspecified: Secondary | ICD-10-CM | POA: Diagnosis not present

## 2023-09-06 DIAGNOSIS — I10 Essential (primary) hypertension: Secondary | ICD-10-CM | POA: Diagnosis not present

## 2023-09-06 DIAGNOSIS — M069 Rheumatoid arthritis, unspecified: Secondary | ICD-10-CM | POA: Diagnosis not present

## 2023-09-06 DIAGNOSIS — D849 Immunodeficiency, unspecified: Secondary | ICD-10-CM | POA: Diagnosis not present

## 2023-09-06 DIAGNOSIS — Z299 Encounter for prophylactic measures, unspecified: Secondary | ICD-10-CM | POA: Diagnosis not present

## 2023-09-06 DIAGNOSIS — I509 Heart failure, unspecified: Secondary | ICD-10-CM | POA: Diagnosis not present

## 2023-09-28 DIAGNOSIS — Z299 Encounter for prophylactic measures, unspecified: Secondary | ICD-10-CM | POA: Diagnosis not present

## 2023-09-28 DIAGNOSIS — M069 Rheumatoid arthritis, unspecified: Secondary | ICD-10-CM | POA: Diagnosis not present

## 2023-09-28 DIAGNOSIS — J449 Chronic obstructive pulmonary disease, unspecified: Secondary | ICD-10-CM | POA: Diagnosis not present

## 2023-09-28 DIAGNOSIS — R52 Pain, unspecified: Secondary | ICD-10-CM | POA: Diagnosis not present

## 2023-09-28 DIAGNOSIS — I509 Heart failure, unspecified: Secondary | ICD-10-CM | POA: Diagnosis not present

## 2023-09-28 DIAGNOSIS — M25561 Pain in right knee: Secondary | ICD-10-CM | POA: Diagnosis not present

## 2023-09-28 DIAGNOSIS — I10 Essential (primary) hypertension: Secondary | ICD-10-CM | POA: Diagnosis not present

## 2023-10-26 DIAGNOSIS — I10 Essential (primary) hypertension: Secondary | ICD-10-CM | POA: Diagnosis not present

## 2023-10-26 DIAGNOSIS — R52 Pain, unspecified: Secondary | ICD-10-CM | POA: Diagnosis not present

## 2023-10-26 DIAGNOSIS — Z299 Encounter for prophylactic measures, unspecified: Secondary | ICD-10-CM | POA: Diagnosis not present

## 2023-10-26 DIAGNOSIS — J449 Chronic obstructive pulmonary disease, unspecified: Secondary | ICD-10-CM | POA: Diagnosis not present

## 2023-10-26 DIAGNOSIS — I509 Heart failure, unspecified: Secondary | ICD-10-CM | POA: Diagnosis not present

## 2023-10-26 DIAGNOSIS — Z0000 Encounter for general adult medical examination without abnormal findings: Secondary | ICD-10-CM | POA: Diagnosis not present

## 2023-10-28 ENCOUNTER — Other Ambulatory Visit: Payer: Self-pay | Admitting: Cardiology

## 2023-11-06 DIAGNOSIS — I509 Heart failure, unspecified: Secondary | ICD-10-CM | POA: Diagnosis not present

## 2023-11-06 DIAGNOSIS — J449 Chronic obstructive pulmonary disease, unspecified: Secondary | ICD-10-CM | POA: Diagnosis not present

## 2023-11-06 DIAGNOSIS — I10 Essential (primary) hypertension: Secondary | ICD-10-CM | POA: Diagnosis not present

## 2023-11-06 DIAGNOSIS — B078 Other viral warts: Secondary | ICD-10-CM | POA: Diagnosis not present

## 2023-11-06 DIAGNOSIS — R52 Pain, unspecified: Secondary | ICD-10-CM | POA: Diagnosis not present

## 2023-11-06 DIAGNOSIS — Z299 Encounter for prophylactic measures, unspecified: Secondary | ICD-10-CM | POA: Diagnosis not present

## 2023-11-28 DIAGNOSIS — I10 Essential (primary) hypertension: Secondary | ICD-10-CM | POA: Diagnosis not present

## 2023-11-28 DIAGNOSIS — Z299 Encounter for prophylactic measures, unspecified: Secondary | ICD-10-CM | POA: Diagnosis not present

## 2023-11-28 DIAGNOSIS — J439 Emphysema, unspecified: Secondary | ICD-10-CM | POA: Diagnosis not present

## 2023-11-28 DIAGNOSIS — I509 Heart failure, unspecified: Secondary | ICD-10-CM | POA: Diagnosis not present

## 2023-11-28 DIAGNOSIS — M069 Rheumatoid arthritis, unspecified: Secondary | ICD-10-CM | POA: Diagnosis not present

## 2023-11-30 ENCOUNTER — Ambulatory Visit: Attending: Cardiology | Admitting: Cardiology

## 2023-11-30 ENCOUNTER — Encounter: Payer: Self-pay | Admitting: Cardiology

## 2023-11-30 VITALS — BP 122/70 | HR 67 | Ht 69.0 in | Wt 187.8 lb

## 2023-11-30 DIAGNOSIS — I2510 Atherosclerotic heart disease of native coronary artery without angina pectoris: Secondary | ICD-10-CM

## 2023-11-30 DIAGNOSIS — E782 Mixed hyperlipidemia: Secondary | ICD-10-CM | POA: Diagnosis not present

## 2023-11-30 DIAGNOSIS — Z79899 Other long term (current) drug therapy: Secondary | ICD-10-CM | POA: Diagnosis not present

## 2023-11-30 NOTE — Progress Notes (Signed)
 Clinical Summary Roy [name] is a 68 y.o.male seen today for follow up of the following medical problems.      1. CAD - CABG Jan 2020 LIMA-LAD, SVG-PDA, and Seq-SVG-OM-RI.  -Jan 2021 nuclear stress: Small, mild to moderate intensity, partially reversible apical to basal inferior defect. Partial reversibility most evident in mid to basal segment consistent with ischemic territory.   - imdur  30 caused orthostatic symptoms in the past, tolerating 15mg  daily.   03/2022 echo: LVEF 60-65%, no WMAs, grade I dd -occasional left side chest pain. Sharp, not positional. Can occur at rest or with exertion. Lasts about 30 seconds. No other associated symptoms. Occurs about every 2-3 months.  - Occasional chest pressure, radiates from mid jaw down to midchest. Episodes every 2-3 months. Pains lasts 30 sec to 1 min, can occur at rest or with activity - weed eating 30 minutes, heavy yardwork tolerates without troubles.  - compliant with meds   2. Syncope - episode occurred 3 week ago. [place] to visit father's grave. Was standing for a period, felt like was going to pass out. Denies dehydration history. No palpitations - often with orthostatic symptoms.  - dr pepper x 5 cans, milk x 1 glass.    - no recent episodes     3. HTN - compliant with meds   4. 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 Prior BMS to OM and RCA, subsequent angioplasty due to ISR in these distributions, also BMS placement with DES to LAD. b. Cath 01/2013: diffuse 3V CAD without clear culprit lesion, normal LVEF, mid LAD lesion noted, treat medically. c. Cath 11/28/2013 DES to mid LAD lesion with abnormal FFR, DES to distal LAD lesion, DES to prox RCA d.  07/24/2018:CABG x4 with LIMA-LAD, SVG-PDA, and Seq-SVG-OM-RI.   Chronic lower back pain    COPD (chronic obstructive pulmonary disease) (HCC)    DDD (degenerative disc disease)    Depression    GERD (gastroesophageal reflux disease)    Hepatitis C C   "had the tx and I'm free of it"   History of blood transfusion    "when I was taking tx for hepatitis"   History of kidney stones    Hyperlipidemia    Hypertension    Hypothyroidism    Kidney stones    "no surgeries"   Macular degeneration of both eyes    "already blind in my left eye"   Myocardial infarction [facility]) ~ 2007    Other and unspecified angina pectoris    Scoliosis    Sinus bradycardia      Allergies  Allergen Reactions   Penicillins Other (See Comments)    Chest pain PATIENT HAS HAD A PCN REACTION WITH IMMEDIATE RASH, FACIAL/TONGUE/THROAT SWELLING, SOB, OR LIGHTHEADEDNESS WITH HYPOTENSION:  #  #  YES  #  #  Has patient had a PCN reaction causing severe rash involving mucus membranes or skin necrosis: no Has patient had a PCN reaction that required hospitalization: no Has patient had a PCN reaction occurring within the last 10 years: no If all of the above answers are "NO", then may proceed with Cephalosporin use.  Imdur  [Isosorbide  Nitrate] Other (See Comments)    Has not tolerated well in the past UNSPECIFIED REACTION    Codeine Itching   Food Rash and Other (See Comments)    PEACHES     Current Outpatient Medications  Medication Sig Dispense Refill   albuterol  (VENTOLIN  HFA) 108 (90 Base) MCG/ACT inhaler Inhale 2 puffs into the lungs every 6 (six) hours as needed.     aspirin  EC 81 MG tablet Take 1 tablet (81 mg total) by mouth daily. Swallow whole. 90 tablet 1   carvedilol  (COREG ) 6.25 MG tablet Take 3.125 mg by mouth 2 (two) times daily with a meal.     folic acid (FOLVITE) 1 MG tablet Take 1 mg by mouth 2 (two) times daily.     furosemide  (LASIX ) 20 MG tablet take 1 tablet once a day as needed for  swelling. 90 tablet 2   gabapentin  (NEURONTIN ) 300 MG capsule Take 600 mg by mouth 3 (three) times daily.      isosorbide  mononitrate (IMDUR ) 30 MG 24 hr tablet take (1/2) tablet by mouth daily. 45 tablet 3   levothyroxine  (SYNTHROID , LEVOTHROID) 75 MCG tablet Take 75 mcg by mouth daily before breakfast.     methotrexate 2.5 MG tablet Take 25 mg by mouth once a week. Caution:Chemotherapy. Protect from light.     nitroGLYCERIN  (NITROSTAT ) 0.4 MG SL tablet Place 1 tablet (0.4 mg total) under the tongue every 5 (five) minutes x 3 doses as needed. (Patient taking differently: Place 0.4 mg under the tongue every 5 (five) minutes x 3 doses as needed for chest pain.) 25 tablet 3   oxyCODONE  (OXY IR/ROXICODONE ) 5 MG immediate release tablet Take 5 mg by mouth 3 (three) times daily as needed.     pantoprazole  (PROTONIX ) 40 MG tablet Take 40 mg by mouth daily.     potassium chloride  (KLOR-CON  M) 10 MEQ tablet Take 10 mEq by mouth 2 (two) times daily.     rosuvastatin  (CRESTOR ) 10 MG tablet Take 1 tablet (10 mg total) by mouth daily. 30 tablet 6   sucralfate (CARAFATE) 1 g tablet Take 1 g by mouth daily.     tamsulosin  (FLOMAX ) 0.4 MG CAPS capsule Take 0.4 mg by mouth daily.     traZODone  (DESYREL ) 100 MG tablet Take 100 mg by mouth at bedtime.     No current facility-administered medications for this visit.     Past Surgical History:  Procedure Laterality Date   BACK SURGERY     x3 for scoliosis   CARDIAC CATHETERIZATION     "I've had a few without intervention"   COLONOSCOPY     COLONOSCOPY N/A 05/02/2013   Procedure: COLONOSCOPY;  Surgeon: Ruby Corporal, MD;  [facility];  Service: Endoscopy;  Laterality: N/A;  1030-moved to 1055 Ann to notify pt   COLONOSCOPY N/A 07/06/2017   Procedure: COLONOSCOPY;  Surgeon: Ruby Corporal, MD;  [facility]: [facility];  Service: Endoscopy;  Laterality: N/A;  12:45   CORONARY ANGIOPLASTY WITH STENT PLACEMENT   2007; 2008; 11/28/2013   "2 + 2; +  3"   CORONARY ARTERY BYPASS GRAFT N/A 07/24/2018   Procedure: CORONARY ARTERY BYPASS GRAFTING (CABG);  Surgeon: Bartley Lightning, MD;  Location: [facility] OR;  Service: Open Heart Surgery;  Laterality: N/A;  Times 4 using left internal mammary artery and endoscopically harvested right saphenous vein   INGUINAL HERNIA REPAIR Bilateral ~ 2000   LEFT HEART CATH AND CORONARY ANGIOGRAPHY N/A  07/12/2018   Procedure: LEFT HEART CATH AND CORONARY ANGIOGRAPHY;  Surgeon: Lucendia Rusk, MD;  Location: [facility] INVASIVE CV LAB;  Service: Cardiovascular;  Laterality: N/A;   LEFT HEART CATHETERIZATION WITH CORONARY ANGIOGRAM N/A 01/30/2013   Procedure: LEFT HEART CATHETERIZATION WITH CORONARY ANGIOGRAM;  Surgeon: Mardell Shade, MD;  Location: [facility] CATH LAB;  Service: Cardiovascular;  Laterality: N/A;   LEFT HEART CATHETERIZATION WITH CORONARY ANGIOGRAM N/A 11/28/2013   Procedure: LEFT HEART CATHETERIZATION WITH CORONARY ANGIOGRAM;  Surgeon: Lucendia Rusk, MD;  Location: [facility] CATH LAB;  Service: Cardiovascular;  Laterality: N/A;   LIVER BIOPSY  03/24/2010   POLYPECTOMY  07/06/2017   Procedure: POLYPECTOMY;  Surgeon: Ruby Corporal, MD;  [facility];  Service: Endoscopy;;  colon   TEE WITHOUT CARDIOVERSION N/A 07/24/2018   Procedure: TRANSESOPHAGEAL ECHOCARDIOGRAM (TEE);  Surgeon: Bartley Lightning, MD;  Location: [facility] OR;  Service: Open Heart Surgery;  Laterality: N/A;   TOOTH EXTRACTION       Allergies  Allergen Reactions   Penicillins Other (See Comments)    Chest pain PATIENT HAS HAD A PCN REACTION WITH IMMEDIATE RASH, FACIAL/TONGUE/THROAT SWELLING, SOB, OR LIGHTHEADEDNESS WITH HYPOTENSION:  #  #  YES  #  #  Has patient had a PCN reaction causing severe rash involving mucus membranes or skin necrosis: no Has patient had a PCN reaction that required hospitalization: no Has patient had a PCN reaction occurring within the last 10 years: no If all of the above answers are "NO", then may proceed with  Cephalosporin use.    Imdur  [Isosorbide  Nitrate] Other (See Comments)    Has not tolerated well in the past UNSPECIFIED REACTION    Codeine Itching   Food Rash and Other (See Comments)    PEACHES      Family History  Problem Relation Age of Onset   Diabetes Mother    Diabetes Brother    Heart disease Brother    Diabetes Brother    Healthy Brother    Healthy Son    Healthy Son      Social History Roy [name] reports that he quit smoking about 17 years ago. His smoking use included cigarettes. He started smoking about 52 years ago. He has a 52.5 pack-year smoking history. He has never used smokeless tobacco. Roy [name] reports no history of alcohol  use.     Physical Examination Today's Vitals   11/30/23 1451  BP: 122/70  Pulse: 67  SpO2: 99%  Weight: 187 lb 12.8 oz (85.2 kg)  Height: 5\' 9"  (1.753 m)   Body mass index is 27.73 kg/m.  Gen: resting comfortably, no acute distress HEENT: no scleral icterus, pupils equal round and reactive, no palptable cervical adenopathy,  CV: RRR, no m/rg, no jvd Resp: Clear to auscultation bilaterally GI: abdomen is soft, non-tender, non-distended, normal bowel sounds, no hepatosplenomegaly MSK: extremities are warm, no edema.  Skin: warm, no rash Neuro:  no focal deficits Psych: appropriate affect   Diagnostic Studies  Cardiac Catheterization: 07/12/2018 Previously placed Prox RCA to Mid RCA stent (unknown type) is widely patent. Mid LM to Dist LM lesion is 40% stenosed. Mid RCA lesion is 10% stenosed. Ost Ramus to Ramus lesion is 95% stenosed. Ost 1st Mrg to 1st Mrg lesion is 95% stenosed. Ost LAD lesion is 50% stenosed. Previously placed Prox LAD stent (unknown type) is widely patent. Ost 1st Diag to 1st Diag lesion is 99% stenosed. Ost 2nd Diag to 2nd Diag lesion is 70% stenosed.  Mid LAD-1 lesion is 75% stenosed. Previously placed Mid LAD-2 stent (unknown type) is widely patent. Dist RCA lesion is 25% stenosed. Dist  Cx lesion is 90% stenosed. The left ventricular systolic function is normal. LV end diastolic pressure is normal. The left ventricular ejection fraction is 55-65% by visual estimate. There is no aortic valve stenosis.   Severe multivessel CAD.  Multiple branches involved and moderate distal left main and ostial LAD disease as well.  Severe mid LAD disease.  CTO of large diagonal Ari Engelbrecht.    When compared to the 2015 cath, there has been significant progression of the mid LAD disease, Hanh Kertesz vessel disease and mild progression of the distal left main disease.   Plan for Cardiac surgery consult for CABG.     TEE: 07/24/2018 Aortic valve: The valve is trileaflet. No stenosis. No regurgitation. Right ventricle: Normal cavity size, wall thickness and ejection fraction. Mitral valve: Trace regurgitation     03/2022 echo 1. Left ventricular ejection fraction, by estimation, is 60 to 65%. The  left ventricle has normal function. The left ventricle has no regional  wall motion abnormalities. Left ventricular diastolic parameters are  consistent with Grade I diastolic  dysfunction (impaired relaxation). The average left ventricular global  longitudinal strain is -18.2 %. The global longitudinal strain is normal.   2. Right ventricular systolic function is normal. The right ventricular  size is normal.   3. The mitral valve is normal in structure. Trivial mitral valve  regurgitation. No evidence of mitral stenosis.   4. The tricuspid valve is abnormal.   5. The aortic valve is tricuspid. Aortic valve regurgitation is not  visualized. No aortic stenosis is present.   6. The inferior vena cava is normal in size with greater than 50%  respiratory variability, suggesting right atrial pressure of 3 mmHg.      Assessment and Plan   1. CAD - recent symptoms are noncardiac in description,  continue current meds -EKG shows SR, no ischemic changes   2. HTN - at goal, continue current meds   3.  Hyperlipidemia - repeat lipid panel  F/u 6 months     Laurann Pollock, M.D.

## 2023-11-30 NOTE — Patient Instructions (Addendum)
 Medication Instructions:   Continue all current medications.   Labwork:  FLP - order given today Reminder:  Nothing to eat or drink after 12 midnight prior to labs. Office will contact with results via phone, letter or mychart.     Testing/Procedures:  none  Follow-Up:  6 months   Any Other Special Instructions Will Be Listed Below (If Applicable).   If you need a refill on your cardiac medications before your next appointment, please call your pharmacy.

## 2023-12-11 DIAGNOSIS — M069 Rheumatoid arthritis, unspecified: Secondary | ICD-10-CM | POA: Diagnosis not present

## 2023-12-12 DIAGNOSIS — Z299 Encounter for prophylactic measures, unspecified: Secondary | ICD-10-CM | POA: Diagnosis not present

## 2023-12-12 DIAGNOSIS — M25549 Pain in joints of unspecified hand: Secondary | ICD-10-CM | POA: Diagnosis not present

## 2023-12-12 DIAGNOSIS — D849 Immunodeficiency, unspecified: Secondary | ICD-10-CM | POA: Diagnosis not present

## 2023-12-12 DIAGNOSIS — M069 Rheumatoid arthritis, unspecified: Secondary | ICD-10-CM | POA: Diagnosis not present

## 2023-12-12 DIAGNOSIS — I509 Heart failure, unspecified: Secondary | ICD-10-CM | POA: Diagnosis not present

## 2023-12-12 DIAGNOSIS — I10 Essential (primary) hypertension: Secondary | ICD-10-CM | POA: Diagnosis not present

## 2023-12-26 DIAGNOSIS — I509 Heart failure, unspecified: Secondary | ICD-10-CM | POA: Diagnosis not present

## 2023-12-26 DIAGNOSIS — M19011 Primary osteoarthritis, right shoulder: Secondary | ICD-10-CM | POA: Diagnosis not present

## 2023-12-26 DIAGNOSIS — Z299 Encounter for prophylactic measures, unspecified: Secondary | ICD-10-CM | POA: Diagnosis not present

## 2023-12-26 DIAGNOSIS — I10 Essential (primary) hypertension: Secondary | ICD-10-CM | POA: Diagnosis not present

## 2023-12-26 DIAGNOSIS — M069 Rheumatoid arthritis, unspecified: Secondary | ICD-10-CM | POA: Diagnosis not present

## 2024-01-24 DIAGNOSIS — M069 Rheumatoid arthritis, unspecified: Secondary | ICD-10-CM | POA: Diagnosis not present

## 2024-01-24 DIAGNOSIS — Z299 Encounter for prophylactic measures, unspecified: Secondary | ICD-10-CM | POA: Diagnosis not present

## 2024-01-24 DIAGNOSIS — I10 Essential (primary) hypertension: Secondary | ICD-10-CM | POA: Diagnosis not present

## 2024-01-24 DIAGNOSIS — I209 Angina pectoris, unspecified: Secondary | ICD-10-CM | POA: Diagnosis not present

## 2024-01-24 DIAGNOSIS — I509 Heart failure, unspecified: Secondary | ICD-10-CM | POA: Diagnosis not present

## 2024-01-24 DIAGNOSIS — J449 Chronic obstructive pulmonary disease, unspecified: Secondary | ICD-10-CM | POA: Diagnosis not present

## 2024-02-22 DIAGNOSIS — Z299 Encounter for prophylactic measures, unspecified: Secondary | ICD-10-CM | POA: Diagnosis not present

## 2024-02-22 DIAGNOSIS — I10 Essential (primary) hypertension: Secondary | ICD-10-CM | POA: Diagnosis not present

## 2024-02-22 DIAGNOSIS — I25119 Atherosclerotic heart disease of native coronary artery with unspecified angina pectoris: Secondary | ICD-10-CM | POA: Diagnosis not present

## 2024-02-22 DIAGNOSIS — I509 Heart failure, unspecified: Secondary | ICD-10-CM | POA: Diagnosis not present

## 2024-02-22 DIAGNOSIS — Z7189 Other specified counseling: Secondary | ICD-10-CM | POA: Diagnosis not present

## 2024-02-22 DIAGNOSIS — Z0000 Encounter for general adult medical examination without abnormal findings: Secondary | ICD-10-CM | POA: Diagnosis not present

## 2024-02-22 DIAGNOSIS — J449 Chronic obstructive pulmonary disease, unspecified: Secondary | ICD-10-CM | POA: Diagnosis not present

## 2024-03-25 ENCOUNTER — Encounter: Payer: Self-pay | Admitting: *Deleted

## 2024-03-25 DIAGNOSIS — R202 Paresthesia of skin: Secondary | ICD-10-CM | POA: Diagnosis not present

## 2024-03-25 DIAGNOSIS — Z23 Encounter for immunization: Secondary | ICD-10-CM | POA: Diagnosis not present

## 2024-03-25 DIAGNOSIS — I10 Essential (primary) hypertension: Secondary | ICD-10-CM | POA: Diagnosis not present

## 2024-03-25 DIAGNOSIS — R52 Pain, unspecified: Secondary | ICD-10-CM | POA: Diagnosis not present

## 2024-03-25 DIAGNOSIS — Z299 Encounter for prophylactic measures, unspecified: Secondary | ICD-10-CM | POA: Diagnosis not present

## 2024-03-25 DIAGNOSIS — M549 Dorsalgia, unspecified: Secondary | ICD-10-CM | POA: Diagnosis not present

## 2024-03-25 DIAGNOSIS — I509 Heart failure, unspecified: Secondary | ICD-10-CM | POA: Diagnosis not present

## 2024-03-26 ENCOUNTER — Encounter: Payer: Self-pay | Admitting: Cardiology

## 2024-04-23 DIAGNOSIS — M549 Dorsalgia, unspecified: Secondary | ICD-10-CM | POA: Diagnosis not present

## 2024-04-23 DIAGNOSIS — H6120 Impacted cerumen, unspecified ear: Secondary | ICD-10-CM | POA: Diagnosis not present

## 2024-04-23 DIAGNOSIS — R52 Pain, unspecified: Secondary | ICD-10-CM | POA: Diagnosis not present

## 2024-04-23 DIAGNOSIS — I10 Essential (primary) hypertension: Secondary | ICD-10-CM | POA: Diagnosis not present

## 2024-04-23 DIAGNOSIS — Z299 Encounter for prophylactic measures, unspecified: Secondary | ICD-10-CM | POA: Diagnosis not present

## 2024-04-23 DIAGNOSIS — Z79899 Other long term (current) drug therapy: Secondary | ICD-10-CM | POA: Diagnosis not present

## 2024-04-23 DIAGNOSIS — I509 Heart failure, unspecified: Secondary | ICD-10-CM | POA: Diagnosis not present

## 2024-04-23 DIAGNOSIS — H6123 Impacted cerumen, bilateral: Secondary | ICD-10-CM | POA: Diagnosis not present

## 2024-07-10 ENCOUNTER — Other Ambulatory Visit: Payer: Self-pay | Admitting: Cardiology
